# Patient Record
Sex: Female | Born: 1945 | Race: Black or African American | Hispanic: No | Smoking: Never smoker
Health system: Southern US, Community
[De-identification: ages and names within clinical notes are randomized; demographics above are authoritative.]

## PROBLEM LIST (undated history)

## (undated) DIAGNOSIS — R413 Other amnesia: Secondary | ICD-10-CM

## (undated) DIAGNOSIS — K219 Gastro-esophageal reflux disease without esophagitis: Secondary | ICD-10-CM

## (undated) DIAGNOSIS — K439 Ventral hernia without obstruction or gangrene: Secondary | ICD-10-CM

## (undated) DIAGNOSIS — G40209 Localization-related (focal) (partial) symptomatic epilepsy and epileptic syndromes with complex partial seizures, not intractable, without status epilepticus: Secondary | ICD-10-CM

## (undated) DIAGNOSIS — E119 Type 2 diabetes mellitus without complications: Secondary | ICD-10-CM

## (undated) HISTORY — PX: TUBAL LIGATION: SHX77

## (undated) HISTORY — DX: Other amnesia: R41.3

## (undated) HISTORY — DX: Type 2 diabetes mellitus without complications: E11.9

## (undated) HISTORY — DX: Localization-related (focal) (partial) symptomatic epilepsy and epileptic syndromes with complex partial seizures, not intractable, without status epilepticus: G40.209

## (undated) HISTORY — PX: BRAIN SURGERY: SHX531

## (undated) HISTORY — DX: Gastro-esophageal reflux disease without esophagitis: K21.9

## (undated) HISTORY — PX: CARDIAC CATHETERIZATION: SHX172

## (undated) HISTORY — PX: VENTRAL HERNIA REPAIR: SHX424

## (undated) HISTORY — PX: HERNIA REPAIR: SHX51

---

## 1999-04-15 ENCOUNTER — Emergency Department (HOSPITAL_COMMUNITY): Admission: EM | Admit: 1999-04-15 | Discharge: 1999-04-15 | Payer: Self-pay

## 1999-06-16 ENCOUNTER — Emergency Department (HOSPITAL_COMMUNITY): Admission: EM | Admit: 1999-06-16 | Discharge: 1999-06-16 | Payer: Self-pay | Admitting: Internal Medicine

## 2001-10-06 ENCOUNTER — Encounter: Payer: Self-pay | Admitting: Emergency Medicine

## 2001-10-06 ENCOUNTER — Emergency Department (HOSPITAL_COMMUNITY): Admission: EM | Admit: 2001-10-06 | Discharge: 2001-10-06 | Payer: Self-pay | Admitting: Emergency Medicine

## 2003-03-14 ENCOUNTER — Emergency Department (HOSPITAL_COMMUNITY): Admission: EM | Admit: 2003-03-14 | Discharge: 2003-03-14 | Payer: Self-pay | Admitting: Emergency Medicine

## 2003-08-15 ENCOUNTER — Emergency Department (HOSPITAL_COMMUNITY): Admission: EM | Admit: 2003-08-15 | Discharge: 2003-08-15 | Payer: Self-pay | Admitting: Emergency Medicine

## 2007-03-11 ENCOUNTER — Emergency Department (HOSPITAL_COMMUNITY): Admission: EM | Admit: 2007-03-11 | Discharge: 2007-03-12 | Payer: Self-pay | Admitting: Emergency Medicine

## 2007-09-01 ENCOUNTER — Emergency Department (HOSPITAL_COMMUNITY): Admission: EM | Admit: 2007-09-01 | Discharge: 2007-09-01 | Payer: Self-pay | Admitting: Emergency Medicine

## 2008-06-17 ENCOUNTER — Emergency Department (HOSPITAL_COMMUNITY): Admission: EM | Admit: 2008-06-17 | Discharge: 2008-06-17 | Payer: Self-pay | Admitting: Emergency Medicine

## 2008-06-23 ENCOUNTER — Inpatient Hospital Stay (HOSPITAL_COMMUNITY): Admission: AD | Admit: 2008-06-23 | Discharge: 2008-06-23 | Payer: Self-pay | Admitting: Obstetrics & Gynecology

## 2008-06-23 ENCOUNTER — Ambulatory Visit: Payer: Self-pay | Admitting: Obstetrics and Gynecology

## 2009-05-18 ENCOUNTER — Emergency Department (HOSPITAL_COMMUNITY): Admission: EM | Admit: 2009-05-18 | Discharge: 2009-05-18 | Payer: Self-pay | Admitting: Emergency Medicine

## 2010-10-27 LAB — DIFFERENTIAL
Eosinophils Absolute: 0.1
Eosinophils Relative: 3
Lymphs Abs: 3.1
Monocytes Absolute: 0.4
Monocytes Relative: 6

## 2010-10-27 LAB — CBC
HCT: 39.8
MCV: 93.1
RBC: 4.28
WBC: 5.8

## 2010-10-27 LAB — BASIC METABOLIC PANEL
CO2: 31
Calcium: 9.5
Creatinine, Ser: 0.63
GFR calc Af Amer: >60

## 2011-02-12 DIAGNOSIS — K56600 Partial intestinal obstruction, unspecified as to cause: Secondary | ICD-10-CM | POA: Insufficient documentation

## 2011-02-13 DIAGNOSIS — E8809 Other disorders of plasma-protein metabolism, not elsewhere classified: Secondary | ICD-10-CM | POA: Insufficient documentation

## 2011-12-25 ENCOUNTER — Encounter (HOSPITAL_COMMUNITY): Payer: Self-pay

## 2011-12-25 ENCOUNTER — Inpatient Hospital Stay (HOSPITAL_COMMUNITY)
Admission: AD | Admit: 2011-12-25 | Discharge: 2011-12-25 | Disposition: A | Discharge status: Home / Self Care | Payer: Non-veteran care | Source: Ambulatory Visit | Attending: Obstetrics & Gynecology | Admitting: Obstetrics & Gynecology

## 2011-12-25 DIAGNOSIS — R030 Elevated blood-pressure reading, without diagnosis of hypertension: Secondary | ICD-10-CM | POA: Insufficient documentation

## 2011-12-25 DIAGNOSIS — H538 Other visual disturbances: Secondary | ICD-10-CM | POA: Insufficient documentation

## 2011-12-25 HISTORY — DX: Ventral hernia without obstruction or gangrene: K43.9

## 2011-12-25 LAB — GLUCOSE, CAPILLARY: Glucose-Capillary: 92 mg/dL (ref 70–99)

## 2011-12-25 NOTE — MAU Provider Note (Signed)
History     CSN: [id number]  Arrival date and time: 12/25/11 1659   None     No chief complaint on file.  HPI 66 y.o. female who comes in feeling "odd" today and wants to have her blood sugar tested. No specific symptoms except some blurred vision and light colored urine. She had high blood sugar in the hospital in February treated with insulin but was not sent home with insulin. She has been told she has "borderline diabetes."  She does not check her sugars and is not on any medication. She checks her urine - if it is very light, she thinks her blood sugar may be high, and it was light today. She had a similar episode 10 years ago and her sugar was 200-300.   She denies headache, shortness of breath, chest pain, palpitations, numbness, tingling, weakness, speech changes, confusion. No fever/chills. She does feel tired today because she baked bread all weekend and is a little sore as well.   She does not have a PCP but has been see in hospital at [facility] for ventral hernia repair. She has VA benefits and is not yet enrolled in Medicare.   Past Medical History  Diagnosis Date  . Ventral hernia   Borderline diabetes  Past Surgical History  Procedure Date  . Cardiac catheterization, normal   . Ventral hernia repair   . Tubal ligation     Family History  Problem Relation Age of Onset  . Arthritis Mother     History  Substance Use Topics  . Smoking status: Never Smoker   . Smokeless tobacco: Not on file  . Alcohol Use: No    Allergies: Allergies not on file  No prescriptions prior to admission   Review of Systems  Constitutional: Negative for fever and chills.  HENT: Negative for hearing loss, congestion and tinnitus.   Eyes: Positive for blurred vision. Negative for double vision.  Respiratory: Negative for cough and shortness of breath.   Cardiovascular: Negative for chest pain, palpitations and leg swelling.  Gastrointestinal: Negative for nausea, vomiting,  abdominal pain, diarrhea and constipation.  Genitourinary: Negative for dysuria and urgency.  Musculoskeletal: Negative for myalgias and back pain.  Neurological: Negative for dizziness, weakness and headaches.  Psychiatric/Behavioral: Negative for depression.   Physical Exam   Blood pressure 148/87, pulse 77, temperature 98.1 F (36.7 C), temperature source Oral, resp. rate 16, SpO2 98.00%.  Physical Exam  Constitutional: She is oriented to person, place, and time. She appears well-developed and well-nourished. No distress.  HENT:  Head: Normocephalic and atraumatic.  Mouth/Throat: Oropharynx is clear and moist.  Eyes: Conjunctivae normal and EOM are normal.  Neck: Normal range of motion. Neck supple. No tracheal deviation present.  Cardiovascular: Normal rate, regular rhythm, normal heart sounds and intact distal pulses.  Exam reveals no gallop and no friction rub.   No murmur heard. Respiratory: Effort normal and breath sounds normal. No stridor. No respiratory distress. She has no wheezes.  GI: Soft. Bowel sounds are normal. There is no tenderness.  Musculoskeletal: Normal range of motion. She exhibits no edema and no tenderness.  Neurological: She is alert and oriented to person, place, and time. She has normal reflexes. She displays normal reflexes. No cranial nerve deficit. She exhibits normal muscle tone.       Strength 5/5 upper and lower extremities bilaterally  Skin: Skin is warm and dry.  Psychiatric: She has a normal mood and affect.    POCT CBG 92  MAU Course  Procedures   Assessment and Plan  66 y.o. female with concern for high blood sugar due to feeling "odd" - normal CBG - BP is elevated at 148/87 - No other symptoms. - Normal neuro exam - Discharge home. Establish care with PCP for BP and blood sugar control  Napoleon Form, MD 12/25/2011 5:53 PM   Napoleon Form 12/25/2011, 5:31 PM

## 2011-12-25 NOTE — MAU Note (Signed)
Patient states she has just not felt like usual today. States she had the same feeling about 10 years ago and her blood sugar was over 300. Was put on oral medication that made her CBG's to low and the medication was stopped. Manages with diet only. Has not had CBG checked since February and got one dose of insulin while hospitalized at [facility] but was not put on medication when she was discharged.

## 2012-01-23 ENCOUNTER — Encounter (HOSPITAL_COMMUNITY): Payer: Self-pay | Admitting: Emergency Medicine

## 2012-01-23 ENCOUNTER — Emergency Department (HOSPITAL_COMMUNITY)
Admission: EM | Admit: 2012-01-23 | Discharge: 2012-01-23 | Disposition: A | Discharge status: Home / Self Care | Payer: Non-veteran care | Attending: Emergency Medicine | Admitting: Emergency Medicine

## 2012-01-23 ENCOUNTER — Emergency Department (HOSPITAL_COMMUNITY): Payer: Non-veteran care

## 2012-01-23 DIAGNOSIS — M549 Dorsalgia, unspecified: Secondary | ICD-10-CM | POA: Insufficient documentation

## 2012-01-23 DIAGNOSIS — R0982 Postnasal drip: Secondary | ICD-10-CM | POA: Insufficient documentation

## 2012-01-23 DIAGNOSIS — R05 Cough: Secondary | ICD-10-CM | POA: Insufficient documentation

## 2012-01-23 DIAGNOSIS — J00 Acute nasopharyngitis [common cold]: Secondary | ICD-10-CM | POA: Insufficient documentation

## 2012-01-23 DIAGNOSIS — J159 Unspecified bacterial pneumonia: Secondary | ICD-10-CM | POA: Insufficient documentation

## 2012-01-23 DIAGNOSIS — R059 Cough, unspecified: Secondary | ICD-10-CM | POA: Insufficient documentation

## 2012-01-23 DIAGNOSIS — Z8719 Personal history of other diseases of the digestive system: Secondary | ICD-10-CM | POA: Insufficient documentation

## 2012-01-23 DIAGNOSIS — J029 Acute pharyngitis, unspecified: Secondary | ICD-10-CM | POA: Insufficient documentation

## 2012-01-23 DIAGNOSIS — J189 Pneumonia, unspecified organism: Secondary | ICD-10-CM

## 2012-01-23 LAB — COMPREHENSIVE METABOLIC PANEL
ALT: 9 U/L (ref 0–35)
Alkaline Phosphatase: 62 U/L (ref 39–117)
GFR calc Af Amer: >90 mL/min (ref >90)
Glucose, Bld: 120 mg/dL — ABNORMAL HIGH (ref 70–99)
Potassium: 4.2 mEq/L (ref 3.5–5.1)
Sodium: 128 mEq/L — ABNORMAL LOW (ref 135–145)
Total Protein: 7.7 g/dL (ref 6.0–8.3)

## 2012-01-23 LAB — RAPID STREP SCREEN (MED CTR MEBANE ONLY): Streptococcus, Group A Screen (Direct): NEGATIVE

## 2012-01-23 LAB — CBC
Hemoglobin: 12.6 g/dL (ref 12.0–15.0)
MCHC: 34.4 g/dL (ref 30.0–36.0)
RBC: 4.05 MIL/uL (ref 3.87–5.11)

## 2012-01-23 MED ORDER — SODIUM CHLORIDE 0.9 % IV BOLUS (SEPSIS): 1000.0000 mL | Freq: Once | INTRAVENOUS | Status: DC

## 2012-01-23 MED ORDER — AZITHROMYCIN 250 MG PO TABS: 250.0000 mg | ORAL_TABLET | Freq: Every day | ORAL | Status: DC

## 2012-01-23 MED ORDER — DEXTROSE 5 % IV SOLN
500.0000 mg | Freq: Once | INTRAVENOUS | Status: AC
  Administered 2012-01-23: 500 mg via INTRAVENOUS
  Filled 2012-01-23 (×2): qty 500

## 2012-01-23 MED ORDER — ACETAMINOPHEN 325 MG PO TABS
650.0000 mg | ORAL_TABLET | Freq: Once | ORAL | Status: AC
  Administered 2012-01-23: 650 mg via ORAL
  Filled 2012-01-23: qty 1

## 2012-01-23 MED ORDER — SODIUM CHLORIDE 0.9 % IV BOLUS (SEPSIS)
1000.0000 mL | Freq: Once | INTRAVENOUS | Status: AC
  Administered 2012-01-23: 1000 mL via INTRAVENOUS

## 2012-01-23 NOTE — Progress Notes (Signed)
No pcp was listed for veteran administration covered pt She confirmed with Cm she is seen by various drs at [facility] EPIC updated

## 2012-01-23 NOTE — ED Notes (Addendum)
Pt presenting to ed with c/o congestion and coughing up a lot of phlegm pt states yellowish in color. Pt denies chest pain, shortness of breath, nausea and vomiting at this time. Pt states onset x 4 days. Pt also c/o sore throat with drainage

## 2012-01-23 NOTE — ED Provider Notes (Signed)
History     CSN: [id number]  Arrival date & time 01/23/12  1343   First MD Initiated Contact with Patient 01/23/12 1503      Chief Complaint  Patient presents with  . Nasal Congestion  . Cough    (Consider location/radiation/quality/duration/timing/severity/associated sxs/prior treatment) HPIMattie Judie Petit Guerrero is a 66 y.o. female with a history of cough and nasal drainage that started on Saturday. It got worse, they're now moderate to severe, she's having rhinorrhea and congestion, mild sore throat, no headaches, occasional chills, productive cough of yellow sputum. Some bilateral pain in her back this to her scapula, this is a minor ache. She is some minor chest pain but only when she coughs, this is not worse when she breathes deeply or when she is active.  Past Medical History  Diagnosis Date  . Ventral hernia     Past Surgical History  Procedure Date  . Cardiac catheterization   . Ventral hernia repair   . Tubal ligation     Family History  Problem Relation Age of Onset  . Arthritis Mother     History  Substance Use Topics  . Smoking status: Never Smoker   . Smokeless tobacco: Not on file  . Alcohol Use: No    OB History    Grav Para Term Preterm Abortions TAB SAB Ect Mult Living   3 3 3       3       Review of Systems At least 10pt or greater review of systems completed and are negative except where specified in the HPI.  Allergies  Review of patient's allergies indicates no known allergies.  Home Medications  No current outpatient prescriptions on file.  BP 117/71  Pulse 104  Temp 100.6 F (38.1 C) (Oral)  Resp 20  SpO2 98%  Physical Exam  Nursing notes reviewed.  Electronic medical record reviewed. VITAL SIGNS:   Filed Vitals:   01/23/12 1347 01/23/12 1352  BP: 144/72 117/71  Pulse: 106 104  Temp: 99.9 F (37.7 C) 100.6 F (38.1 C)  TempSrc: Oral Oral  Resp: 16 20  SpO2: 98% 98%   CONSTITUTIONAL: Awake, oriented, appears  non-toxic HENT: Atraumatic, normocephalic, oral mucosa pink and moist, airway patent. Nares patent, turbinates edematous with clear nasal drainage. 3+ tonsils, no exudate mild erythema. External ears normal. EYES: Conjunctiva clear, EOMI, PERRLA NECK: Trachea midline, non-tender, supple CARDIOVASCULAR: Normal heart rate, Normal rhythm, No murmurs, rubs, gallops PULMONARY/CHEST: Decreased breath sounds in mid-lung posterior, with rales. Clear otherwise. Non-tender. ABDOMINAL: Non-distended, soft, non-tender - no rebound or guarding.  BS normal. NEUROLOGIC: Non-focal, moving all four extremities, no gross sensory or motor deficits. EXTREMITIES: No clubbing, cyanosis, or edema SKIN: Warm, Dry, No erythema, No rash  ED Course  Procedures (including critical care time)   Labs Reviewed  RAPID STREP SCREEN  CBC  COMPREHENSIVE METABOLIC PANEL   Dg Chest 2 View  01/23/2012  *RADIOLOGY REPORT*  Clinical Data:  CHEST - 2 VIEW  Comparison: 03/11/2007.  Findings: Two-view exam shows focal opacity in the posterior aspect of the medial left lower lobe.  Right lung is clear.  Interstitial markings are mildly coarsened. The cardiopericardial silhouette is within normal limits for size. Imaged bony structures of the thorax are intact. Telemetry leads overlie the chest.  IMPRESSION: Focal opacity in the posterior left lower lobe.  Given the clinical history of fever and cough, this is likely pneumonia.  However given the focal appearance, close follow-up is recommended to ensure complete resolution  as underlying neoplasm could present similarly.   Original Report Authenticated By: Kennith Center, M.D.      1. Community acquired pneumonia   2. Coryza   3. Post-nasal drainage       MDM  Sabrina Guerrero is a 66 y.o. female presents with symptoms suggestive of pneumonia. Patient has no sinus symptoms, her throat looks mildly irritated from postnasal drip, she does have changes in breath sounds commensurate  with changes on chest x-ray. Patient is mildly dehydrated, 3 hydrated her with some normal saline given her some azithromycin IV. Patient's white count is normal, she is afebrile, she's not exhibiting any septic physiology. She is nontoxic clinically. If the patient can safely be treated as an outpatient, give her 4 more days of azithromycin and she'll be discharged stable home in good condition.  I explained the diagnosis and have given explicit precautions to return to the ER including altered mental status, shortness of breath, chest pain, or any other new or worsening symptoms. The patient understands and accepts the medical plan as it's been dictated and I have answered their questions. Discharge instructions concerning home care and prescriptions have been given.  The patient is STABLE and is discharged to home in good condition.            Jones Skene, MD 01/25/12 2130

## 2012-01-31 ENCOUNTER — Emergency Department (HOSPITAL_COMMUNITY)
Admission: EM | Admit: 2012-01-31 | Discharge: 2012-01-31 | Discharge status: Against Medical Advice | Payer: Non-veteran care | Attending: Emergency Medicine | Admitting: Emergency Medicine

## 2012-01-31 DIAGNOSIS — Z760 Encounter for issue of repeat prescription: Secondary | ICD-10-CM | POA: Insufficient documentation

## 2012-01-31 DIAGNOSIS — M545 Low back pain, unspecified: Secondary | ICD-10-CM | POA: Insufficient documentation

## 2012-01-31 NOTE — ED Notes (Signed)
Pt not in WR when called

## 2012-01-31 NOTE — ED Notes (Signed)
Pt c/o drainage down throat and pain down lower back. Pt finished Zithromax on Tues. Pt requesting "stronger" abx to clear up her drainage. Pt states her lower back pain is not anything new. Pt with no acute distress.

## 2012-01-31 NOTE — ED Notes (Signed)
Not in WR when called 

## 2012-11-17 ENCOUNTER — Telehealth: Payer: Self-pay

## 2012-11-17 NOTE — Telephone Encounter (Signed)
Patient states that she has stopped all of her medications because they were making her feel so bad.  Would not give a pharmacy because she doesn't get any medications Has been using the [place] [facility] for healthcare. ROI for records (?) [facility] with bowel obstruction in early part of 2014   To discuss with provider: "I will explain to her my situation when I see her tomorrow".

## 2012-11-18 ENCOUNTER — Encounter: Payer: Self-pay | Admitting: Gastroenterology

## 2012-11-18 ENCOUNTER — Encounter: Payer: Self-pay | Admitting: Family Medicine

## 2012-11-18 ENCOUNTER — Ambulatory Visit (INDEPENDENT_AMBULATORY_CARE_PROVIDER_SITE_OTHER): Payer: Medicare Other | Admitting: Family Medicine

## 2012-11-18 VITALS — BP 120/74 | HR 82 | Temp 98.1°F | Resp 16 | Ht 60.75 in | Wt 145.5 lb

## 2012-11-18 DIAGNOSIS — E041 Nontoxic single thyroid nodule: Secondary | ICD-10-CM

## 2012-11-18 DIAGNOSIS — E119 Type 2 diabetes mellitus without complications: Secondary | ICD-10-CM | POA: Diagnosis not present

## 2012-11-18 DIAGNOSIS — Z8719 Personal history of other diseases of the digestive system: Secondary | ICD-10-CM

## 2012-11-18 DIAGNOSIS — E1165 Type 2 diabetes mellitus with hyperglycemia: Secondary | ICD-10-CM | POA: Insufficient documentation

## 2012-11-18 DIAGNOSIS — K219 Gastro-esophageal reflux disease without esophagitis: Secondary | ICD-10-CM

## 2012-11-18 DIAGNOSIS — F441 Dissociative fugue: Secondary | ICD-10-CM | POA: Insufficient documentation

## 2012-11-18 LAB — BASIC METABOLIC PANEL
Calcium: 9.4 mg/dL (ref 8.4–10.5)
GFR: 93.45 mL/min (ref >60.00)
Sodium: 140 mEq/L (ref 135–145)

## 2012-11-18 LAB — CBC WITH DIFFERENTIAL/PLATELET
Basophils Absolute: 0 10*3/uL (ref 0.0–0.1)
Eosinophils Absolute: 0 10*3/uL (ref 0.0–0.7)
HCT: 37.8 % (ref 36.0–46.0)
Lymphs Abs: 1.7 10*3/uL (ref 0.7–4.0)
MCHC: 33.5 g/dL (ref 30.0–36.0)
MCV: 94.7 fl (ref 78.0–100.0)
Monocytes Absolute: 0.3 10*3/uL (ref 0.1–1.0)
Monocytes Relative: 6.2 % (ref 3.0–12.0)
Platelets: 212 10*3/uL (ref 150.0–400.0)
RDW: 13.4 % (ref 11.5–14.6)

## 2012-11-18 LAB — HEPATIC FUNCTION PANEL
Alkaline Phosphatase: 65 U/L (ref 39–117)
Bilirubin, Direct: 0 mg/dL (ref 0.0–0.3)
Total Bilirubin: 0.3 mg/dL (ref 0.3–1.2)

## 2012-11-18 LAB — LIPID PANEL
HDL: 41.1 mg/dL (ref >39.00)
Total CHOL/HDL Ratio: 6
Triglycerides: 62 mg/dL (ref 0.0–149.0)

## 2012-11-18 LAB — B12 AND FOLATE PANEL: Vitamin B-12: 510 pg/mL (ref 211–911)

## 2012-11-18 MED ORDER — PANTOPRAZOLE SODIUM 40 MG PO TBEC: 40.0000 mg | DELAYED_RELEASE_TABLET | Freq: Every day | ORAL | Status: DC

## 2012-11-18 NOTE — Patient Instructions (Signed)
Schedule your complete physical in 3 months We'll notify you of your lab results and make any changes if needed We'll call you with your Ultrasound appt, Neuro appt, and GI appt Start the Protonix daily for the stomach acid/gas pain Please don't drive until you see Neurology Call with any questions or concerns Hang in there!!!
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
## 2012-11-19 ENCOUNTER — Ambulatory Visit (HOSPITAL_BASED_OUTPATIENT_CLINIC_OR_DEPARTMENT_OTHER): Payer: Non-veteran care

## 2012-11-19 ENCOUNTER — Other Ambulatory Visit: Payer: Self-pay | Admitting: General Practice

## 2012-11-19 ENCOUNTER — Encounter: Payer: Self-pay | Admitting: General Practice

## 2012-11-19 ENCOUNTER — Ambulatory Visit (HOSPITAL_BASED_OUTPATIENT_CLINIC_OR_DEPARTMENT_OTHER)
Admission: RE | Admit: 2012-11-19 | Discharge: 2012-11-19 | Disposition: A | Discharge status: Home / Self Care | Payer: Non-veteran care | Source: Ambulatory Visit | Attending: Family Medicine | Admitting: Family Medicine

## 2012-11-19 DIAGNOSIS — E041 Nontoxic single thyroid nodule: Secondary | ICD-10-CM | POA: Insufficient documentation

## 2012-11-19 MED ORDER — ATORVASTATIN CALCIUM 20 MG PO TABS: 20.0000 mg | ORAL_TABLET | Freq: Every day | ORAL | Status: DC

## 2012-11-20 ENCOUNTER — Other Ambulatory Visit: Payer: Self-pay | Admitting: General Practice

## 2012-11-20 DIAGNOSIS — E041 Nontoxic single thyroid nodule: Secondary | ICD-10-CM

## 2012-11-21 ENCOUNTER — Ambulatory Visit: Payer: Non-veteran care | Admitting: Gastroenterology

## 2012-12-03 ENCOUNTER — Ambulatory Visit (INDEPENDENT_AMBULATORY_CARE_PROVIDER_SITE_OTHER): Payer: Medicare Other | Admitting: Family Medicine

## 2012-12-03 ENCOUNTER — Encounter: Payer: Self-pay | Admitting: Family Medicine

## 2012-12-03 VITALS — BP 110/74 | HR 68 | Temp 98.3°F | Resp 16 | Wt 145.1 lb

## 2012-12-03 DIAGNOSIS — F411 Generalized anxiety disorder: Secondary | ICD-10-CM | POA: Diagnosis not present

## 2012-12-03 DIAGNOSIS — M62838 Other muscle spasm: Secondary | ICD-10-CM | POA: Diagnosis not present

## 2012-12-03 NOTE — Progress Notes (Signed)
  Subjective:    Patient ID: Sabrina Guerrero, female    DOB: 04-10-1945, 67 y.o.   MRN: [id number]  HPI R arm pain- mostly localized to R upper arm but will migrate into lower arm and across back.  Feels pain is related to her 'nerves'.  R hand dominant.  Pain is worse at end of the day.  Nervous- 'i'm real, real nervous'.  This is new for pt.  No hx of anxiety.  Nervous feeling will come in waves.  'it scares me'.  sxs will last ~20 minutes and then resolve spontaneously.  Will take [facility] supplement w/ some relief of sxs.  Nervousness doesn't relate to her episodes of lost time- pt tends to lose time at night.  No particular trigger.   Review of Systems For ROS see HPI     Objective:   Physical Exam  Vitals reviewed. Constitutional: She is oriented to person, place, and time. She appears well-developed and well-nourished. No distress.  HENT:  Head: Normocephalic and atraumatic.  Neck: Normal range of motion.  R trap spasm  Musculoskeletal:  Full ROM of R shoulder, elbow, wrist  Neurological: She is alert and oriented to person, place, and time. She has normal reflexes. No cranial nerve deficit. Coordination normal.  Skin: Skin is warm and dry.  Psychiatric: She has a normal mood and affect. Her behavior is normal. Thought content normal.          Assessment & Plan:

## 2012-12-03 NOTE — Patient Instructions (Signed)
Follow up as scheduled Your neck, back, and arm pain are due to overuse (hard work) and will improve with Tylenol as needed and heating pad Your nerves are likely due to increased stress from work and all that you're trying to do You may want to consider decreasing your work schedule If your nerves continue to be an issue, please let me know so we can start medicine Call with any questions or concerns Hang in there!!!

## 2012-12-05 ENCOUNTER — Ambulatory Visit: Payer: Non-veteran care | Admitting: Family Medicine

## 2012-12-07 NOTE — Assessment & Plan Note (Signed)
New to provider.  Pt's sxs had improved w/ PPI but stopped this due to perceived side effects.  Start Protonix and monitor for improvement.  Will follow.

## 2012-12-07 NOTE — Assessment & Plan Note (Signed)
New to provider.  Pt reports she was hospitalized at [facility] for this but we have no records.  Overdue for colonoscopy.  Refer to GI.  Pt expressed understanding and is in agreement w/ plan.

## 2012-12-07 NOTE — Assessment & Plan Note (Signed)
New.  Discussed scaling back her workload as this appears to be too much for pt.  Pt considering stopping entirely.  Had conversation w/ pt and her daughter about possible medications and pt would prefer to hold at this time and try and manage her stress more naturally.

## 2012-12-07 NOTE — Assessment & Plan Note (Signed)
New to provider, ongoing for pt.  Was managing w/ healthy diet- no current meds due to symptomatic hypoglycemia.  Check labs.  LDL goal is 70-100, not currently on ACE/ARB.  Due for eye exam.  Will need baseline A1C and determine next steps.  Pt expressed understanding and is in agreement w/ plan.

## 2012-12-07 NOTE — Assessment & Plan Note (Signed)
New.  Very concerning.  Check labs to r/o underlying metabolic cause.  Refer to neuro.  Pt instructed not to drive as there is no known trigger for her lost periods of time.  Will follow closely.

## 2012-12-07 NOTE — Assessment & Plan Note (Addendum)
New.  Start tylenol and ibuprofen prn.  Heat.  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  Will follow.

## 2012-12-07 NOTE — Assessment & Plan Note (Signed)
New.  Pt denies any previous knowledge of this.  Check labs and get [place] to assess.

## 2013-01-07 ENCOUNTER — Other Ambulatory Visit (INDEPENDENT_AMBULATORY_CARE_PROVIDER_SITE_OTHER): Payer: Medicare Other

## 2013-01-07 DIAGNOSIS — E785 Hyperlipidemia, unspecified: Secondary | ICD-10-CM | POA: Diagnosis not present

## 2013-01-08 ENCOUNTER — Encounter: Payer: Self-pay | Admitting: General Practice

## 2013-01-08 LAB — HEPATIC FUNCTION PANEL
ALT: 11 U/L (ref 0–35)
AST: 19 U/L (ref 0–37)
Albumin: 3.7 g/dL (ref 3.5–5.2)
Alkaline Phosphatase: 55 U/L (ref 39–117)

## 2013-03-10 ENCOUNTER — Telehealth: Payer: Self-pay | Admitting: Neurology

## 2013-03-10 NOTE — Telephone Encounter (Signed)
I will be happy to see this patient for an evaluation. The patient can be worked in.

## 2013-03-10 NOTE — Telephone Encounter (Signed)
Spoke to Dr. Gershon Crane about patient, whom he knows, and he requested that Dr. Jannifer Franklin see the patient.  She has been having episodes of "passing out", starring where she does not respond, and also memory loss.  He said this has been going on for about 3-4 months.  He is very concerned.  I spoke to Dr. Jannifer Franklin who will see the patient asap  and relayed this to Dr. Gershon Crane.  He was very thankful.

## 2013-03-11 ENCOUNTER — Encounter: Payer: Self-pay | Admitting: Neurology

## 2013-03-11 ENCOUNTER — Encounter (INDEPENDENT_AMBULATORY_CARE_PROVIDER_SITE_OTHER): Payer: Self-pay

## 2013-03-11 ENCOUNTER — Ambulatory Visit (INDEPENDENT_AMBULATORY_CARE_PROVIDER_SITE_OTHER): Payer: Non-veteran care | Admitting: Neurology

## 2013-03-11 VITALS — BP 97/58 | HR 71 | Ht 62.0 in | Wt 146.0 lb

## 2013-03-11 DIAGNOSIS — R4182 Altered mental status, unspecified: Secondary | ICD-10-CM | POA: Insufficient documentation

## 2013-03-11 HISTORY — DX: Altered mental status, unspecified: R41.82
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
## 2013-03-17 ENCOUNTER — Ambulatory Visit (INDEPENDENT_AMBULATORY_CARE_PROVIDER_SITE_OTHER): Payer: Non-veteran care | Admitting: Radiology

## 2013-03-17 ENCOUNTER — Telehealth: Payer: Self-pay | Admitting: Neurology

## 2013-03-17 DIAGNOSIS — R4182 Altered mental status, unspecified: Secondary | ICD-10-CM

## 2013-03-17 MED ORDER — LEVETIRACETAM 500 MG PO TABS: ORAL_TABLET | ORAL | Status: DC

## 2013-03-17 NOTE — Procedures (Signed)
     Sabrina Guerrero is a 68 year old patient with a history of events of "staring off" beginning in March of 2014, occasionally associated with lipsmacking behavior. The patient may have confusion lasting 1 or 2 minutes following the episodes of staring. The patient is being evaluated for possible seizure events.  This is a routine EEG. No skull defects are noted. Medications include simethicone.  EEG classification: Dysrhythmia grade 3 right temporal  Description of the recording: The background rhythms of this recording consists of a very well modulated medium amplitude alpha rhythm of 10 Hz that is reactive to eye opening and closure. As the record progresses, intermittent 3 or 4 Hz theta slowing is seen in the right temporal region. As the record continues, intermittent sharp and slow-wave complexes are seen emanating from the right hemisphere as well, occasionally associated with more generalized brief theta frequency slowing. Photic stimulation is performed, and this results in a bilateral and symmetric photic drive response. Hyperventilation is also done, resulting in a minimal buildup of the background rhythm activity without significant slowing seen. EKG monitor shows no evidence of cardiac rhythm abnormalities with a heart rate of 72.  Impression: This is an abnormal EEG recording secondary to epileptiform activity emanating from the right temporal region. 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 This study would correlate well with the clinical manifestations of staring episodes.

## 2013-03-17 NOTE — Telephone Encounter (Signed)
Patient called and is returning Dr. Jannifer Franklin' call regarding the medication he prescribed for her today, patient states she works part time and Dr. Jannifer Franklin mentioned that the meds could cause grumpiness or drowsiness. Patient is wondering if she will still be able to work. Please call and advise.

## 2013-03-17 NOTE — Telephone Encounter (Signed)
I called patient. The Keppra generally is well tolerated, plus we are going to start at half dose, gradually go to the full dose. If side effects are noted, the patient is to call me.

## 2013-03-17 NOTE — Telephone Encounter (Signed)
I called patient. The EEG study reveals evidence of a right temporal irritable focus suggestive of seizures. The patient will be placed on Keppra. MRI of the brain is pending.

## 2013-03-20 ENCOUNTER — Inpatient Hospital Stay: Admission: RE | Admit: 2013-03-20 | Payer: Non-veteran care | Source: Ambulatory Visit

## 2013-03-30 ENCOUNTER — Encounter (HOSPITAL_COMMUNITY): Payer: Self-pay | Admitting: Emergency Medicine

## 2013-03-30 ENCOUNTER — Emergency Department (HOSPITAL_COMMUNITY)
Admission: EM | Admit: 2013-03-30 | Discharge: 2013-03-31 | Disposition: A | Discharge status: Home / Self Care | Payer: Non-veteran care | Attending: Emergency Medicine | Admitting: Emergency Medicine

## 2013-03-30 DIAGNOSIS — E119 Type 2 diabetes mellitus without complications: Secondary | ICD-10-CM | POA: Insufficient documentation

## 2013-03-30 DIAGNOSIS — M79602 Pain in left arm: Secondary | ICD-10-CM

## 2013-03-30 DIAGNOSIS — R569 Unspecified convulsions: Secondary | ICD-10-CM

## 2013-03-30 DIAGNOSIS — Z9889 Other specified postprocedural states: Secondary | ICD-10-CM | POA: Insufficient documentation

## 2013-03-30 DIAGNOSIS — R9431 Abnormal electrocardiogram [ECG] [EKG]: Secondary | ICD-10-CM | POA: Diagnosis not present

## 2013-03-30 DIAGNOSIS — Z8719 Personal history of other diseases of the digestive system: Secondary | ICD-10-CM | POA: Insufficient documentation

## 2013-03-30 DIAGNOSIS — M79609 Pain in unspecified limb: Secondary | ICD-10-CM | POA: Insufficient documentation

## 2013-03-30 DIAGNOSIS — G40909 Epilepsy, unspecified, not intractable, without status epilepticus: Secondary | ICD-10-CM | POA: Insufficient documentation

## 2013-03-30 LAB — I-STAT TROPONIN, ED: Troponin i, poc: 0 ng/mL (ref 0.00–0.08)

## 2013-03-30 MED ORDER — LEVETIRACETAM 250 MG PO TABS
250.0000 mg | ORAL_TABLET | Freq: Once | ORAL | Status: AC
  Administered 2013-03-31: 250 mg via ORAL
  Filled 2013-03-30: qty 1

## 2013-03-30 NOTE — ED Notes (Addendum)
Presents with left arm pain with radiation to left shoulder blade, began suddenly at 9 pm. Described as sharp and "like part of my body stopped circulation, that is what it felt like in my hand" denies chest pain, denies slurred speech, denies headache and blurred vision.  No arm drift, no facial droop. Denies nausea, denies SOB. Pt is alert, oriented, MAEx4. Bilateral grips equal. BP 115/72.  Symptoms have resolved on own.

## 2013-03-30 NOTE — ED Provider Notes (Signed)
CSN: [id number]     Arrival date & time 03/30/13  2227 History   First MD Initiated Contact with Patient 03/30/13 2331     Chief Complaint  Patient presents with  . 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 She states this started approximately 3 hours prior to evaluation, began in her hand, with sharp and extremely uncomfortable, it spread proximally up her forearm, to her upper arm and then onto her back below her left shoulder blade. This then spontaneously resolved. She denied any other symptoms during that time including chest pain shortness of breath numbness or weakness. She has never had cardiac disease, she has a distant history of a heart catheterization which was nonobstructive at that time, has diet-controlled diabetes and is not treated for hypertension, hypercholesterolemia and is a nonsmoker. At this time the patient is symptom-free. During the history portion of my encounter with the patient she had a focal seizure similar to what was described in the notes from Dr. Jannifer Franklin of neurology. This included staring off, lip smacking and resolved after approximately 1 minute. There was a short period where the patient was confused and then returned back to her baseline.  Patient is a 68 y.o. female presenting with arm pain. The history is provided by the patient, a relative and medical records.  Arm Pain    Past Medical History  Diagnosis Date  . Ventral hernia   . Diabetes mellitus without complication   . Gastroesophageal reflux disease    Past Surgical History  Procedure Laterality Date  . Cardiac catheterization    . Ventral hernia repair    . Tubal ligation     Family History  Problem Relation Age of Onset  . Arthritis Mother   . Cancer Brother    lung   History  Substance Use Topics  . Smoking status: Never Smoker   . Smokeless tobacco: Never Used  . Alcohol Use: No   OB History   Grav Para Term Preterm Abortions TAB SAB Ect Mult Living   3 3 3       3      Review of Systems  All other systems reviewed and are negative.      Allergies  Review of patient's allergies indicates no known allergies.  Home Medications   No current outpatient prescriptions on file. BP 121/73  Pulse 72  Temp(Src) 97.6 F (36.4 C) (Oral)  Resp 19  Wt 147 lb (66.679 kg)  SpO2 98% Physical Exam  Nursing note and vitals reviewed. Constitutional: She appears well-developed and well-nourished. No distress.  HENT:  Head: Normocephalic and atraumatic.  Mouth/Throat: Oropharynx is clear and moist. No oropharyngeal exudate.  Eyes: Conjunctivae and EOM are normal. Pupils are equal, round, and reactive to light. Right eye exhibits no discharge. Left eye exhibits no discharge. No scleral icterus.  Neck: Normal range of motion. Neck supple. No JVD present. No thyromegaly present.  Cardiovascular: Normal rate, regular rhythm, normal heart sounds and intact distal pulses.  Exam reveals no gallop and no friction rub.   No murmur heard. Pulmonary/Chest: Effort normal and breath sounds normal. No respiratory distress. She has no wheezes. She has no rales.  Abdominal: Soft. Bowel sounds are normal. She exhibits no distension and no mass. There is no tenderness.  Musculoskeletal: Normal range of motion. She exhibits no edema and  no tenderness.  Lymphadenopathy:    She has no cervical adenopathy.  Neurological: She is alert. Coordination normal.  Normal strength sensation and reflexes of the bilateral upper and lower extremities on the speech is clear, cranial nerves III through XII are intact, peripheral visual fields are normal  Skin: Skin is warm and dry. No rash noted. No erythema.  Psychiatric: She has a normal mood and affect. Her behavior is  normal.    ED Course  Procedures (including critical care time) Labs Review Labs Reviewed  CBC - Abnormal; Notable for the following:    RBC 3.82 (*)    HCT 35.9 (*)    All other components within normal limits  BASIC METABOLIC PANEL - Abnormal; Notable for the following:    Glucose, Bld 114 (*)    GFR calc non Af Amer 88 (*)    All other components within normal limits  I-STAT TROPOININ, ED  I-STAT TROPOININ, ED   Imaging Review No results found.  EKG Interpretation    Date/Time:  Monday March 30 2013 22:53:45 EST Ventricular Rate:  75 PR Interval:  144 QRS Duration: 90 QT Interval:  396 QTC Calculation: 442 R Axis:   3 Text Interpretation:  Normal sinus rhythm Left anterior fasicular block Poor R wave progression Abnormal ECG since last tracing no significant change Confirmed by Nandi Tonnesen  MD, Kiante Ciavarella ([id number]) on 03/30/2013 11:36:20 PM            MDM   Final diagnoses:  Pain of left upper extremity  Seizure    The patient has unremarkable vital signs, unremarkable EKG and a clinical exam is benign at this time. The etiology of the patient's symptoms is unclear at this time, she is extremely low risk for heart disease, she will get a troponin and a repeat troponin. The initial troponin is normal. In addition the patient had a seizure however this is an ongoing medical evaluation and there is no acute intervention is required at this time. I will give her an additional dose of her Keppra as she is currently only taking 250 mg once a day.  The patient has had no more seizures since getting Keppra, she now admits to not using the Keppra since starting it after the first time she took it and it made her feel a bit drowsy. She also endorses that she has been driving, I have cautioned her not to drive anymore and told her that it would be legally wrong for her to be doing so. The family is aware of this conversation, they will help to take her medication and to not drive. 2  troponins normal, patient appears stable for discharge, no ongoing symptoms.   Meds given in ED:  Medications  levETIRAcetam (KEPPRA) tablet 250 mg (250 mg Oral Given 03/31/13 0027)    New Prescriptions   No medications on file      Johnna Acosta, MD 03/31/13 [phone]

## 2013-03-30 NOTE — ED Notes (Signed)
Dr Miller at Bedside.

## 2013-03-31 LAB — BASIC METABOLIC PANEL
BUN: 13 mg/dL (ref 6–23)
CO2: 28 meq/L (ref 19–32)
Calcium: 9.3 mg/dL (ref 8.4–10.5)
Chloride: 100 mEq/L (ref 96–112)
Creatinine, Ser: 0.69 mg/dL (ref 0.50–1.10)
GFR calc Af Amer: >90 mL/min (ref >90)
GFR calc non Af Amer: 88 mL/min — ABNORMAL LOW (ref >90)
GLUCOSE: 114 mg/dL — AB (ref 70–99)
POTASSIUM: 3.8 meq/L (ref 3.7–5.3)
SODIUM: 138 meq/L (ref 137–147)

## 2013-03-31 LAB — CBC
HEMATOCRIT: 35.9 % — AB (ref 36.0–46.0)
HEMOGLOBIN: 12.2 g/dL (ref 12.0–15.0)
MCH: 31.9 pg (ref 26.0–34.0)
MCHC: 34 g/dL (ref 30.0–36.0)
MCV: 94 fL (ref 78.0–100.0)
Platelets: 194 10*3/uL (ref 150–400)
RBC: 3.82 MIL/uL — AB (ref 3.87–5.11)
RDW: 12.7 % (ref 11.5–15.5)
WBC: 6.3 10*3/uL (ref 4.0–10.5)

## 2013-03-31 LAB — I-STAT TROPONIN, ED: TROPONIN I, POC: 0.02 ng/mL (ref 0.00–0.08)

## 2013-03-31 NOTE — Discharge Instructions (Signed)
Take the Keppra exactly as prescribed by your doctor.  Please call your doctor for a followup appointment within 24-48 hours. When you talk to your doctor please let them know that you were seen in the emergency department and have them acquire all of your records so that they can discuss the findings with you and formulate a treatment plan to fully care for your new and ongoing problems.

## 2013-04-09 ENCOUNTER — Telehealth: Payer: Self-pay | Admitting: Neurology

## 2013-04-09 MED ORDER — LEVETIRACETAM ER 500 MG PO TB24: 1000.0000 mg | ORAL_TABLET | Freq: Every day | ORAL | Status: DC

## 2013-04-09 NOTE — Telephone Encounter (Signed)
Pt called and stated that the medicine she was prescribed makes her face feel like there is something pulling on it.  This feeling last for 4 - 5 hours and seems to affect her when she takes it in the morning.  She states if it happens at her night dose she is unsure as she is asleep.  She also would like to know how long she will need to take this medication. She also stated that when she went to the ER at the end of February they gave her a medication that they said was the same thing, but it did not make her feel the same way.  She was wondering if maybe it was a generic or made by another company.  She also would like to know what is her official diagnosis.  Please call to discuss.  Thank you

## 2013-04-09 NOTE — Telephone Encounter (Signed)
I called patient. The patient went to the emergency room on 03/30/2013 with another staring episode. The patient has been on low-dose Keppra taking 250 mg twice daily, but she indicates that this results in a heavy sensation on the face bilaterally. The patient has missed some doses because of this. I will convert the patient to Keppra XR to see if the side effect profile can be improved. I'll go up to 1000 mg daily.

## 2013-04-09 NOTE — Telephone Encounter (Addendum)
I called pt.  She is taking generic keppra 250mg  po bid.  She took initially for 3 days, then off for a week, then restarted up again.  Never has gone to 1 tablet po bid.  C/o of daytime facial heaviness, nagging feeling pain after taking the keppra (last for 4-5 hours then subsides).  Notes nothing with night-time dose (sleeps thru?).  Received dose in ED back 03-30-13 (250mg  keppra) and noted that she did not have this feeling in her face.  Different manufacturer?    I told her she had R focal temporal seizures.  Next appt 07-09-13 with NP.  Has not had anymore seizures (staring spell, lip smacking).

## 2013-04-24 ENCOUNTER — Telehealth: Payer: Self-pay | Admitting: Neurology

## 2013-04-24 NOTE — Telephone Encounter (Signed)
Patient's son is calling to see if the Cat scan done at the [facility] hospital has arrived and to make Dr. Jannifer Franklin is aware.

## 2013-04-24 NOTE — Telephone Encounter (Signed)
I have called all numbers available, the numbers given are not functioning. I have not received the CT scan done to the [facility]. I'll look out for it.

## 2013-04-24 NOTE — Telephone Encounter (Signed)
Pt's son called and stated that he took his mother to the [place] hospital in [place] on 04-15-13 and they completed a CAT scan.  He wanted to see if the results of that scan have been received and to make Dr. Jannifer Franklin aware that this had been done.  Please call the son back at [phone] (cell) or [phone] (home).  Thank you

## 2013-04-27 ENCOUNTER — Telehealth: Payer: Self-pay | Admitting: Neurology

## 2013-04-27 MED ORDER — KEPPRA XR 500 MG PO TB24: 1000.0000 mg | ORAL_TABLET | Freq: Every day | ORAL | Status: DC

## 2013-04-27 NOTE — Telephone Encounter (Signed)
Patient's daughter calling to state that patient does not like the way generic Keppra makes her feel, patient states they gave her something in the hospital that was brand name and that made her feel much better. Patient requesting brand name of Keppra instead, please call and advise.

## 2013-04-27 NOTE — Telephone Encounter (Signed)
I called patient. The patient apparently does not feel well on the generic Keppra, I will call in brand-name Rx are taking 1000 mg at night. She's not able tolerate this, we may need to cut back to 500 mg at night.

## 2013-07-09 ENCOUNTER — Ambulatory Visit: Payer: Self-pay | Admitting: Nurse Practitioner

## 2013-07-09 ENCOUNTER — Telehealth: Payer: Self-pay | Admitting: Nurse Practitioner

## 2013-07-09 ENCOUNTER — Telehealth: Payer: Self-pay | Admitting: Neurology

## 2013-07-09 MED ORDER — CARBAMAZEPINE 200 MG PO TABS: 100.0000 mg | ORAL_TABLET | Freq: Two times a day (BID) | ORAL | Status: DC

## 2013-07-09 NOTE — Telephone Encounter (Signed)
Patient's daughter Andee Poles calling to state that patient had a bad reaction to Keppra and they would like to discuss different brands when she comes in for her visit today. If questions, please return call to daughter.

## 2013-07-09 NOTE — Telephone Encounter (Signed)
I called the daughter, left a message. I will call back later.

## 2013-07-09 NOTE — Telephone Encounter (Signed)
I called the daughter, apparently the patient never really took the [place], indicated she felt bad on it. I will try calling in low dose carbamazepine.

## 2013-07-09 NOTE — Telephone Encounter (Signed)
Patient was no show for today's office appointment.  

## 2013-07-09 NOTE — Telephone Encounter (Signed)
Called pt's daughter Andee Poles and left message about pt having a bad reaction to Keppra and discussing the issue when pt comes in today for appt, but pt did not show up for her appt and if there was anything else that we could do for the pt, to give our office a call. FYI

## 2013-11-03 ENCOUNTER — Emergency Department (HOSPITAL_COMMUNITY)
Admission: EM | Admit: 2013-11-03 | Discharge: 2013-11-03 | Disposition: A | Discharge status: Home / Self Care | Payer: Medicare HMO | Attending: Emergency Medicine | Admitting: Emergency Medicine

## 2013-11-03 ENCOUNTER — Emergency Department (HOSPITAL_COMMUNITY): Payer: Medicare HMO

## 2013-11-03 ENCOUNTER — Encounter (HOSPITAL_COMMUNITY): Payer: Self-pay | Admitting: Emergency Medicine

## 2013-11-03 DIAGNOSIS — Z9889 Other specified postprocedural states: Secondary | ICD-10-CM | POA: Insufficient documentation

## 2013-11-03 DIAGNOSIS — Z79899 Other long term (current) drug therapy: Secondary | ICD-10-CM | POA: Diagnosis not present

## 2013-11-03 DIAGNOSIS — R079 Chest pain, unspecified: Secondary | ICD-10-CM | POA: Insufficient documentation

## 2013-11-03 DIAGNOSIS — Z8719 Personal history of other diseases of the digestive system: Secondary | ICD-10-CM | POA: Diagnosis not present

## 2013-11-03 DIAGNOSIS — R0789 Other chest pain: Secondary | ICD-10-CM | POA: Diagnosis not present

## 2013-11-03 DIAGNOSIS — E119 Type 2 diabetes mellitus without complications: Secondary | ICD-10-CM | POA: Diagnosis not present

## 2013-11-03 DIAGNOSIS — R110 Nausea: Secondary | ICD-10-CM | POA: Diagnosis not present

## 2013-11-03 LAB — BASIC METABOLIC PANEL
Anion gap: 8 (ref 5–15)
BUN: 7 mg/dL (ref 6–23)
CALCIUM: 9.3 mg/dL (ref 8.4–10.5)
CO2: 29 meq/L (ref 19–32)
Chloride: 99 mEq/L (ref 96–112)
Creatinine, Ser: 0.57 mg/dL (ref 0.50–1.10)
GFR calc Af Amer: >90 mL/min (ref >90)
GFR calc non Af Amer: >90 mL/min (ref >90)
GLUCOSE: 102 mg/dL — AB (ref 70–99)
POTASSIUM: 3.8 meq/L (ref 3.7–5.3)
Sodium: 136 mEq/L — ABNORMAL LOW (ref 137–147)

## 2013-11-03 LAB — I-STAT TROPONIN, ED
Troponin i, poc: 0 ng/mL (ref 0.00–0.08)
Troponin i, poc: 0 ng/mL (ref 0.00–0.08)

## 2013-11-03 LAB — CBC
HCT: 36.3 % (ref 36.0–46.0)
HEMOGLOBIN: 12.2 g/dL (ref 12.0–15.0)
MCH: 30.9 pg (ref 26.0–34.0)
MCHC: 33.6 g/dL (ref 30.0–36.0)
MCV: 91.9 fL (ref 78.0–100.0)
PLATELETS: 200 10*3/uL (ref 150–400)
RBC: 3.95 MIL/uL (ref 3.87–5.11)
RDW: 12.1 % (ref 11.5–15.5)
WBC: 4.4 10*3/uL (ref 4.0–10.5)

## 2013-11-03 MED ORDER — NITROGLYCERIN 0.4 MG SL SUBL
0.4000 mg | SUBLINGUAL_TABLET | Freq: Once | SUBLINGUAL | Status: AC
Start: 2013-11-03 — End: 2013-11-03
  Administered 2013-11-03: 0.4 mg via SUBLINGUAL
  Filled 2013-11-03: qty 1

## 2013-11-03 NOTE — ED Notes (Signed)
Pt discharged to home with family; NAD noted

## 2013-11-03 NOTE — Discharge Instructions (Signed)

## 2013-11-03 NOTE — ED Provider Notes (Signed)
Pt has ongoing mild back pain - states is non exertional, labs / ECG reviewed, pt appears stable for d/c and has f/u with GI and will f/u with PMD and cards,  Will return if sx worsen.   Johnna Acosta, MD 11/03/13 [phone]

## 2013-11-03 NOTE — ED Notes (Signed)
Pt ambulated to restroom. 

## 2013-11-03 NOTE — ED Notes (Addendum)
Pt reports midsternal cp radiating to L side starting a few days ago, worse today; pt also reports pain to R arm; pt denies SOB, n/v

## 2013-11-03 NOTE — ED Notes (Signed)
Dr. Alvino Chapel back at the bedside.

## 2013-11-03 NOTE — ED Provider Notes (Addendum)
CSN: [id number]     Arrival date & time 11/03/13  1158 History   First MD Initiated Contact with Patient 11/03/13 1355     Chief Complaint  Patient presents with  . Chest Pain     (Consider location/radiation/quality/duration/timing/severity/associated sxs/prior Treatment) Patient is a 68 y.o. female presenting with chest pain. The history is provided by the patient.  Chest Pain Pain location:  L chest Associated symptoms: nausea   Associated symptoms: no abdominal pain, no back pain, no headache, no numbness, no shortness of breath, not vomiting and no weakness    patient with a dull pain in her left chest. It is dull. Mild tightness in the upper chest also. No nausea. No difficulty breathing. No known coronary disease. Has a history of prediabetes. Has a history of GERD. Pain is not worse with exertion. She's had some dull right-sided pain, but states it started with left-sided pain. It does not change with exertion. She does walk daily. She had a negative heart catheterization, however that was in 1985.   Past Medical History  Diagnosis Date  . Ventral hernia   . Diabetes mellitus without complication   . Gastroesophageal reflux disease    Past Surgical History  Procedure Laterality Date  . Cardiac catheterization    . Ventral hernia repair    . Tubal ligation     Family History  Problem Relation Age of Onset  . Arthritis Mother   . Cancer Brother     lung   History  Substance Use Topics  . Smoking status: Never Smoker   . Smokeless tobacco: Never Used  . Alcohol Use: No   OB History   Grav Para Term Preterm Abortions TAB SAB Ect Mult Living   3 3 3       3      Review of Systems  Constitutional: Negative for activity change and appetite change.  Eyes: Negative for pain.  Respiratory: Negative for chest tightness and shortness of breath.   Cardiovascular: Positive for chest pain. Negative for leg swelling.  Gastrointestinal: Positive for nausea. Negative for  vomiting, abdominal pain and diarrhea.  Genitourinary: Negative for flank pain.  Musculoskeletal: Negative for back pain and neck stiffness.  Skin: Negative for rash.  Neurological: Negative for weakness, numbness and headaches.  Psychiatric/Behavioral: Negative for behavioral problems.      Allergies  Keppra  Home Medications   Prior to Admission medications   Medication Sig Start Date End Date Taking? Authorizing Provider  carbamazepine (TEGRETOL) 200 MG tablet Take 0.5 tablets (100 mg total) by mouth 2 (two) times daily. 07/09/13   Kathrynn Ducking, MD   BP 131/82  Pulse 66  Temp(Src) 98.1 F (36.7 C) (Oral)  Resp 13  Ht 5\' 2"  (1.575 m)  Wt 142 lb (64.411 kg)  BMI 25.97 kg/m2  SpO2 98% Physical Exam  Nursing note and vitals reviewed. Constitutional: She is oriented to person, place, and time. She appears well-developed and well-nourished.  HENT:  Head: Normocephalic and atraumatic.  Eyes: EOM are normal. Pupils are equal, round, and reactive to light.  Neck: Normal range of motion. Neck supple.  Cardiovascular: Normal rate, regular rhythm and normal heart sounds.   No murmur heard. Pulmonary/Chest: Effort normal and breath sounds normal. No respiratory distress. She has no wheezes. She has no rales.  Abdominal: Soft. Bowel sounds are normal. She exhibits no distension. There is no tenderness. There is no rebound and no guarding.  Musculoskeletal: Normal range of motion.  Neurological:  She is alert and oriented to person, place, and time. No cranial nerve deficit.  Skin: Skin is warm and dry.  Psychiatric: She has a normal mood and affect. Her speech is normal.    ED Course  Procedures (including critical care time) Labs Review Labs Reviewed  BASIC METABOLIC PANEL - Abnormal; Notable for the following:    Sodium 136 (*)    Glucose, Bld 102 (*)    All other components within normal limits  CBC  I-STAT TROPOININ, ED  Randolm Idol, ED    Imaging Review Dg  Chest 2 View  11/03/2013   CLINICAL DATA:  68 year old female with left upper chest pain for 2 days. Initial encounter.  EXAM: CHEST  2 VIEW  COMPARISON:  01/23/2012 and earlier.  FINDINGS: Improved lung volumes and resolved left perihilar consolidation that was present in 2013. Normal cardiac size and mediastinal contours. Visualized tracheal air column is within normal limits. No pneumothorax, pulmonary edema, pleural effusion or confluent pulmonary opacity. No acute osseous abnormality identified.  IMPRESSION: No acute cardiopulmonary abnormality.   Electronically Signed   By: Lars Pinks M.D.   On: 11/03/2013 14:02     EKG Interpretation   Date/Time:  Tuesday November 03 2013 12:04:00 EDT Ventricular Rate:  78 PR Interval:  138 QRS Duration: 88 QT Interval:  392 QTC Calculation: 446 R Axis:   -17 Text Interpretation:  Normal sinus rhythm Possible Left atrial enlargement  Borderline ECG Confirmed by Alvino Chapel  MD, Ovid Curd [phone]) on 11/03/2013  1:55:42 PM      MDM   Final diagnoses:  Chest pain, unspecified chest pain type    Patient with chest pain. Overall relatively low risk. Story is not very worrisome. Continued mild dull pain. Will get second troponin and if negative will discharge home to followup with her PCP    Jasper Riling. Alvino Chapel, MD 11/03/13 1518  Initial pulse ox was 77%, however is seen no hypoxia during my evaluations with her.  Jasper Riling. Alvino Chapel, MD 11/03/13 1535

## 2013-12-07 ENCOUNTER — Encounter (HOSPITAL_COMMUNITY): Payer: Self-pay | Admitting: Emergency Medicine

## 2013-12-23 ENCOUNTER — Encounter: Payer: Self-pay | Admitting: Neurology

## 2013-12-29 ENCOUNTER — Encounter: Payer: Self-pay | Admitting: Neurology

## 2014-01-10 ENCOUNTER — Emergency Department (HOSPITAL_COMMUNITY)
Admission: EM | Admit: 2014-01-10 | Discharge: 2014-01-10 | Disposition: A | Discharge status: Home / Self Care | Payer: Medicare HMO | Attending: Emergency Medicine | Admitting: Emergency Medicine

## 2014-01-10 ENCOUNTER — Emergency Department (HOSPITAL_COMMUNITY): Payer: Medicare HMO

## 2014-01-10 ENCOUNTER — Encounter (HOSPITAL_COMMUNITY): Payer: Self-pay | Admitting: *Deleted

## 2014-01-10 DIAGNOSIS — M549 Dorsalgia, unspecified: Secondary | ICD-10-CM | POA: Diagnosis not present

## 2014-01-10 DIAGNOSIS — M542 Cervicalgia: Secondary | ICD-10-CM | POA: Diagnosis not present

## 2014-01-10 DIAGNOSIS — J029 Acute pharyngitis, unspecified: Secondary | ICD-10-CM | POA: Diagnosis present

## 2014-01-10 DIAGNOSIS — Z9889 Other specified postprocedural states: Secondary | ICD-10-CM | POA: Diagnosis not present

## 2014-01-10 DIAGNOSIS — Z79899 Other long term (current) drug therapy: Secondary | ICD-10-CM | POA: Insufficient documentation

## 2014-01-10 DIAGNOSIS — E119 Type 2 diabetes mellitus without complications: Secondary | ICD-10-CM | POA: Diagnosis not present

## 2014-01-10 DIAGNOSIS — R070 Pain in throat: Secondary | ICD-10-CM | POA: Diagnosis not present

## 2014-01-10 DIAGNOSIS — Z8719 Personal history of other diseases of the digestive system: Secondary | ICD-10-CM | POA: Insufficient documentation

## 2014-01-10 LAB — CBC WITH DIFFERENTIAL/PLATELET
BASOS ABS: 0 10*3/uL (ref 0.0–0.1)
Basophils Relative: 0 % (ref 0–1)
EOS PCT: 2 % (ref 0–5)
Eosinophils Absolute: 0.1 10*3/uL (ref 0.0–0.7)
HEMATOCRIT: 34.5 % — AB (ref 36.0–46.0)
Hemoglobin: 11.3 g/dL — ABNORMAL LOW (ref 12.0–15.0)
LYMPHS PCT: 54 % — AB (ref 12–46)
Lymphs Abs: 1.8 10*3/uL (ref 0.7–4.0)
MCH: 31 pg (ref 26.0–34.0)
MCHC: 32.8 g/dL (ref 30.0–36.0)
MCV: 94.8 fL (ref 78.0–100.0)
MONO ABS: 0.3 10*3/uL (ref 0.1–1.0)
Monocytes Relative: 8 % (ref 3–12)
Neutro Abs: 1.2 10*3/uL — ABNORMAL LOW (ref 1.7–7.7)
Neutrophils Relative %: 36 % — ABNORMAL LOW (ref 43–77)
PLATELETS: 189 10*3/uL (ref 150–400)
RBC: 3.64 MIL/uL — ABNORMAL LOW (ref 3.87–5.11)
RDW: 12.5 % (ref 11.5–15.5)
WBC: 3.3 10*3/uL — AB (ref 4.0–10.5)

## 2014-01-10 LAB — BASIC METABOLIC PANEL
ANION GAP: 12 (ref 5–15)
BUN: 7 mg/dL (ref 6–23)
CO2: 27 mEq/L (ref 19–32)
Calcium: 9.4 mg/dL (ref 8.4–10.5)
Chloride: 102 mEq/L (ref 96–112)
Creatinine, Ser: 0.58 mg/dL (ref 0.50–1.10)
GFR calc Af Amer: >90 mL/min (ref >90)
Glucose, Bld: 110 mg/dL — ABNORMAL HIGH (ref 70–99)
Potassium: 4 mEq/L (ref 3.7–5.3)
SODIUM: 141 meq/L (ref 137–147)

## 2014-01-10 LAB — I-STAT CHEM 8, ED
BUN: 5 mg/dL — ABNORMAL LOW (ref 6–23)
Calcium, Ion: 1.17 mmol/L (ref 1.13–1.30)
Chloride: 104 mEq/L (ref 96–112)
Creatinine, Ser: 0.6 mg/dL (ref 0.50–1.10)
Glucose, Bld: 108 mg/dL — ABNORMAL HIGH (ref 70–99)
HEMATOCRIT: 38 % (ref 36.0–46.0)
HEMOGLOBIN: 12.9 g/dL (ref 12.0–15.0)
Potassium: 3.8 mEq/L (ref 3.7–5.3)
SODIUM: 141 meq/L (ref 137–147)
TCO2: 26 mmol/L (ref 0–100)

## 2014-01-10 MED ORDER — CLINDAMYCIN PHOSPHATE 600 MG/50ML IV SOLN
600.0000 mg | Freq: Once | INTRAVENOUS | Status: AC
  Administered 2014-01-10: 600 mg via INTRAVENOUS
  Filled 2014-01-10: qty 50

## 2014-01-10 MED ORDER — METHYLPREDNISOLONE SODIUM SUCC 125 MG IJ SOLR
125.0000 mg | Freq: Once | INTRAMUSCULAR | Status: AC
  Administered 2014-01-10: 125 mg via INTRAVENOUS
  Filled 2014-01-10: qty 2

## 2014-01-10 MED ORDER — SODIUM CHLORIDE 0.9 % IV BOLUS (SEPSIS)
500.0000 mL | Freq: Once | INTRAVENOUS | Status: AC
Start: 2014-01-10 — End: 2014-01-10
  Administered 2014-01-10: 500 mL via INTRAVENOUS

## 2014-01-10 MED ORDER — CLINDAMYCIN HCL 150 MG PO CAPS: 300.0000 mg | ORAL_CAPSULE | Freq: Four times a day (QID) | ORAL | Status: DC

## 2014-01-10 MED ORDER — HYDROCODONE-ACETAMINOPHEN 5-325 MG PO TABS: 1.0000 | ORAL_TABLET | ORAL | Status: DC | PRN

## 2014-01-10 MED ORDER — IOHEXOL 300 MG/ML  SOLN
100.0000 mL | Freq: Once | INTRAMUSCULAR | Status: AC | PRN
  Administered 2014-01-10: 100 mL via INTRAVENOUS

## 2014-01-10 NOTE — Discharge Instructions (Signed)
Return to the ER for increasing pain, throat swelling, difficulty swallowing or breathing. Otherwise follow-up with Dr. Wilburn Cornelia in 3-5 days in the office for a recheck.  Pharyngitis Pharyngitis is redness, pain, and swelling (inflammation) of your pharynx.  CAUSES  Pharyngitis is usually caused by infection. Most of the time, these infections are from viruses (viral) and are part of a cold. However, sometimes pharyngitis is caused by bacteria (bacterial). Pharyngitis can also be caused by allergies. Viral pharyngitis may be spread from person to person by coughing, sneezing, and personal items or utensils (cups, forks, spoons, toothbrushes). Bacterial pharyngitis may be spread from person to person by more intimate contact, such as kissing.  SIGNS AND SYMPTOMS  Symptoms of pharyngitis include:   Sore throat.   Tiredness (fatigue).   Low-grade fever.   Headache.  Joint pain and muscle aches.  Skin rashes.  Swollen lymph nodes.  Plaque-like film on throat or tonsils (often seen with bacterial pharyngitis). DIAGNOSIS  Your health care provider will ask you questions about your illness and your symptoms. Your medical history, along with a physical exam, is often all that is needed to diagnose pharyngitis. Sometimes, a rapid strep test is done. Other lab tests may also be done, depending on the suspected cause.  TREATMENT  Viral pharyngitis will usually get better in 3-4 days without the use of medicine. Bacterial pharyngitis is treated with medicines that kill germs (antibiotics).  HOME CARE INSTRUCTIONS   Drink enough water and fluids to keep your urine clear or pale yellow.   Only take over-the-counter or prescription medicines as directed by your health care provider:   If you are prescribed antibiotics, make sure you finish them even if you start to feel better.   Do not take aspirin.   Get lots of rest.   Gargle with 8 oz of salt water ( tsp of salt per 1 qt of  water) as often as every 1-2 hours to soothe your throat.   Throat lozenges (if you are not at risk for choking) or sprays may be used to soothe your throat. SEEK MEDICAL CARE IF:   You have large, tender lumps in your neck.  You have a rash.  You cough up green, yellow-brown, or bloody spit. SEEK IMMEDIATE MEDICAL CARE IF:   Your neck becomes stiff.  You drool or are unable to swallow liquids.  You vomit or are unable to keep medicines or liquids down.  You have severe pain that does not go away with the use of recommended medicines.  You have trouble breathing (not caused by a stuffy nose). MAKE SURE YOU:   Understand these instructions.  Will watch your condition.  Will get help right away if you are not doing well or get worse. Document Released: 01/22/2005 Document Revised: 11/12/2012 Document Reviewed: 09/29/2012 [facility] Patient Information 2015 [place], [place]. This information is not intended to replace advice given to you by your health care provider. Make sure you discuss any questions you have with your health care provider.

## 2014-01-10 NOTE — ED Notes (Signed)
Pt c/o sore throat since Friday and posterior neck pain extending up into head and upper back. Has been using hydrogen peroxide gargle to help sore throat.

## 2014-01-10 NOTE — ED Provider Notes (Signed)
CSN: [id number]     Arrival date & time 01/10/14  1227 History   First MD Initiated Contact with Patient 01/10/14 1242     Chief Complaint  Patient presents with  . Sore Throat  . Headache     (Consider location/radiation/quality/duration/timing/severity/associated sxs/prior Treatment) HPI Comments: Resents to the ER for evaluation of sore throat, headache, neck pain, back pain. Symptoms began 3 days ago. Patient reports that the sore throat has become intermittent. It gets better when she gargles with peroxide but then returns. She has noticed an aching pain across the shoulder blades posteriorly that goes up into the neck and the back part of her head. She has not had any fever. There is no vision change, numbness, tingling, weakness of extremities. Patient denies any injuries.  Patient is a 68 y.o. female presenting with pharyngitis and headaches.  Sore Throat Associated symptoms include headaches.  Headache Associated symptoms: back pain, neck pain and sore throat     Past Medical History  Diagnosis Date  . Ventral hernia   . Diabetes mellitus without complication   . Gastroesophageal reflux disease    Past Surgical History  Procedure Laterality Date  . Cardiac catheterization    . Ventral hernia repair    . Tubal ligation     Family History  Problem Relation Age of Onset  . Arthritis Mother   . Cancer Brother     lung   History  Substance Use Topics  . Smoking status: Never Smoker   . Smokeless tobacco: Never Used  . Alcohol Use: No   OB History    Gravida Para Term Preterm AB TAB SAB Ectopic Multiple Living   3 3 3       3      Review of Systems  HENT: Positive for sore throat.   Musculoskeletal: Positive for back pain and neck pain.  Neurological: Positive for headaches.  All other systems reviewed and are negative.     Allergies  Keppra  Home Medications   Prior to Admission medications   Medication Sig Start Date End Date Taking? Authorizing  Provider  OXcarbazepine (TRILEPTAL) 150 MG tablet Take 150 mg by mouth 2 (two) times daily.   Yes Historical Provider, MD   BP 142/99 mmHg  Pulse 60  Temp(Src) 98.5 F (36.9 C)  Resp 18  Ht 5\' 2"  (1.575 m)  Wt 135 lb (61.236 kg)  BMI 24.69 kg/m2  SpO2 96% Physical Exam  Constitutional: She is oriented to person, place, and time. She appears well-developed and well-nourished. No distress.  HENT:  Head: Normocephalic and atraumatic.  Right Ear: Hearing normal.  Left Ear: Hearing normal.  Nose: Nose normal.  Mouth/Throat: Oropharynx is clear and moist and mucous membranes are normal.    Eyes: Conjunctivae and EOM are normal. Pupils are equal, round, and reactive to light.  Neck: Normal range of motion. Neck supple. No Brudzinski's sign and no Kernig's sign noted.  Cardiovascular: Regular rhythm, S1 normal and S2 normal.  Exam reveals no gallop and no friction rub.   No murmur heard. Pulmonary/Chest: Effort normal and breath sounds normal. No respiratory distress. She exhibits no tenderness.  Abdominal: Soft. Normal appearance and bowel sounds are normal. There is no hepatosplenomegaly. There is no tenderness. There is no rebound, no guarding, no tenderness at McBurney's point and negative Murphy's sign. No hernia.  Musculoskeletal: Normal range of motion.  Neurological: She is alert and oriented to person, place, and time. She has normal strength. No cranial  nerve deficit or sensory deficit. Coordination normal. GCS eye subscore is 4. GCS verbal subscore is 5. GCS motor subscore is 6.  Skin: Skin is warm, dry and intact. No rash noted. No cyanosis.  Psychiatric: She has a normal mood and affect. Her speech is normal and behavior is normal. Thought content normal.  Nursing note and vitals reviewed.   ED Course  Procedures (including critical care time) Labs Review Labs Reviewed  CBC WITH DIFFERENTIAL - Abnormal; Notable for the following:    WBC 3.3 (*)    RBC 3.64 (*)     Hemoglobin 11.3 (*)    HCT 34.5 (*)    Neutrophils Relative % 36 (*)    Neutro Abs 1.2 (*)    Lymphocytes Relative 54 (*)    All other components within normal limits  BASIC METABOLIC PANEL - Abnormal; Notable for the following:    Glucose, Bld 110 (*)    All other components within normal limits  I-STAT CHEM 8, ED - Abnormal; Notable for the following:    BUN 5 (*)    Glucose, Bld 108 (*)    All other components within normal limits    Imaging Review Ct Soft Tissue Neck W Contrast  01/10/2014   CLINICAL DATA:  Sore throat since Friday.  Posterior neck pain.  EXAM: CT NECK WITH CONTRAST  TECHNIQUE: Multidetector CT imaging of the neck was performed using the standard protocol following the bolus administration of intravenous contrast.  CONTRAST:  184mL OMNIPAQUE IOHEXOL 300 MG/ML  SOLN  COMPARISON:  None.  FINDINGS: There is a 6 mm peripherally enhancing low-density focus in the right parapharyngeal soft tissue. There are left tonsilliths with a small amount of fluid density around 1 of the tonsillith which may reflect retained fluid versus infection There is no drainable abscess. There is no lymphadenopathy. There is no nasopharyngeal or oropharyngeal mass. The airway is patent. The visualized portions of the carotid arteries and internal jugular veins are patent.  The thyroid gland is diffusely heterogeneous with areas of calcification overall enlargement likely reflecting a thyroid goiter. The appearance is similar to the prior CT chest exam of 03/11/2007.  The visualized portions of the brain demonstrate no focal abnormality.  The paranasal sinuses are clear.  The lung apices are clear.  There is no lytic or blastic osseous lesion.  IMPRESSION: 1. There is a 6 mm peripherally enhancing low-density focus in the right parapharyngeal soft tissue likely representing a small abscess. 2. There are left tonsilliths with a small amount of fluid density around 1 of the tonsillith which may reflect  retained fluid versus infection. 3. Diffuse thyroid enlargement with a heterogeneous overall appearance and areas of calcification. The overall appearance is most consistent with a thyroid goiter which is similar to prior CT chest dated 03/11/2007, but the thyroid was incompletely imaged on that exam.   Electronically Signed   By: Kathreen Devoid   On: 01/10/2014 14:41     EKG Interpretation None      MDM   Final diagnoses:  Throat pain   early peritonsillar abscess/phlegmon  Patient presents to the ER for evaluation of sore throat and throat and neck pain and has been ongoing for several days. Examination did not reveal any significant erythema or exudate of the throat, however, there was some asymmetric swelling of the right peritonsillar region. No clear indication of peritonsillar abscess. There was also some discoloration of the left tonsillar pillar. This raises my suspicion for possible mass  as well as infection. CT scan was performed to evaluate for mass, peritonsillar abscess, retropharyngeal abscess. There is a 6 mm hypoattenuating area in the right peritonsillar region that is suspicious for abscess. Discussed with Dr. Wilburn Cornelia, on call for ENT. He agrees with medical management. We'll see the patient in the office. Patient given instruction is to return to the ER for worsening pain or trouble swallowing.   Orpah Greek, MD 01/10/14 1537

## 2014-01-13 DIAGNOSIS — J029 Acute pharyngitis, unspecified: Secondary | ICD-10-CM | POA: Diagnosis not present

## 2014-01-13 DIAGNOSIS — Z87898 Personal history of other specified conditions: Secondary | ICD-10-CM | POA: Diagnosis not present

## 2014-03-14 ENCOUNTER — Emergency Department (HOSPITAL_COMMUNITY)
Admission: EM | Admit: 2014-03-14 | Discharge: 2014-03-14 | Discharge status: Against Medical Advice | Payer: Medicare Other | Attending: Emergency Medicine | Admitting: Emergency Medicine

## 2014-03-14 ENCOUNTER — Encounter (HOSPITAL_COMMUNITY): Payer: Self-pay | Admitting: Emergency Medicine

## 2014-03-14 DIAGNOSIS — E119 Type 2 diabetes mellitus without complications: Secondary | ICD-10-CM | POA: Insufficient documentation

## 2014-03-14 DIAGNOSIS — Z9119 Patient's noncompliance with other medical treatment and regimen: Secondary | ICD-10-CM

## 2014-03-14 DIAGNOSIS — R109 Unspecified abdominal pain: Secondary | ICD-10-CM | POA: Insufficient documentation

## 2014-03-14 DIAGNOSIS — Z5320 Procedure and treatment not carried out because of patient's decision for unspecified reasons: Secondary | ICD-10-CM

## 2014-03-14 NOTE — ED Notes (Signed)
Patient here with c/o of abdominal pain mostly on left side. Denies n/v/d. States that she takes Omeprazole and is about out of it, wondering if she could get med refill.

## 2014-03-14 NOTE — ED Notes (Signed)
Pt states she is leaving and going to [place] DR and RN made aware

## 2014-03-14 NOTE — ED Provider Notes (Signed)
Pt told nursing she was going to leave and go to the [place]. She left before being seen.   1. Left before treatment completed      Ernestina Patches, MD 03/14/14 1500

## 2014-05-20 ENCOUNTER — Ambulatory Visit (INDEPENDENT_AMBULATORY_CARE_PROVIDER_SITE_OTHER): Payer: Medicare Other | Admitting: Neurology

## 2014-05-20 ENCOUNTER — Encounter: Payer: Self-pay | Admitting: Neurology

## 2014-05-20 VITALS — BP 98/64 | HR 63 | Ht 62.0 in | Wt 139.2 lb

## 2014-05-20 DIAGNOSIS — G40209 Localization-related (focal) (partial) symptomatic epilepsy and epileptic syndromes with complex partial seizures, not intractable, without status epilepticus: Secondary | ICD-10-CM | POA: Insufficient documentation

## 2014-05-20 DIAGNOSIS — Z5181 Encounter for therapeutic drug level monitoring: Secondary | ICD-10-CM | POA: Diagnosis not present

## 2014-05-20 DIAGNOSIS — R413 Other amnesia: Secondary | ICD-10-CM

## 2014-05-20 HISTORY — DX: Localization-related (focal) (partial) symptomatic epilepsy and epileptic syndromes with complex partial seizures, not intractable, without status epilepticus: G40.209

## 2014-05-20 HISTORY — DX: Other amnesia: R41.3

## 2014-05-20 NOTE — Progress Notes (Signed)
Reason for visit: Seizures  Sabrina Guerrero is an 69 y.o. female  History of present illness:  Sabrina Guerrero is a 69 year old right-handed black female with a history of partial complex type seizures. She was seen last on 03/11/2013. The patient underwent an EEG study which showed evidence of right temporal sharp and slow-wave complexes. The patient had been set up for a brain MRI study, but this was never done. The patient had a CT scan of the brain done through the [facility], but I do not have results of the study. She has never had MRI evaluation. The patient was placed on Keppra, but she could not tolerate the medication. This was discontinued, and she has been placed on Trileptal, currently taking 150 mg twice daily. She indicates that she has not had any recurrent seizures. The last noted seizure was on March 30 2013. 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 There have been some mild issues with memory. The patient did not show for an appointment in June 2015, and she just now is following up through this office for the seizures.  Past Medical History  Diagnosis Date  . Ventral hernia   . Diabetes mellitus without complication   . Gastroesophageal reflux disease   . Partial symptomatic epilepsy with complex partial seizures, not intractable, without status epilepticus 05/20/2014  . Memory deficit 05/20/2014    Past Surgical History  Procedure Laterality Date  . Cardiac catheterization    . Ventral hernia repair    . Tubal ligation      Family History  Problem Relation Age of Onset  . Arthritis Mother   . Cancer Brother     lung    Social history:  reports that she has never smoked. She has never used smokeless tobacco. She reports that she does not drink alcohol or use illicit drugs.    Allergies  Allergen Reactions  . Keppra [Levetiracetam]     Malaise    Medications:  Prior to Admission medications   Medication Sig Start Date End  Date Taking? Authorizing Provider  atorvastatin (LIPITOR) 80 MG tablet Take 40 mg by mouth daily.   Yes Historical Provider, MD  HYDROcodone-acetaminophen (NORCO/VICODIN) 5-325 MG per tablet Take 1-2 tablets by mouth every 4 (four) hours as needed for moderate pain. 01/10/14  Yes Orpah Greek, MD  omeprazole (PRILOSEC) 20 MG capsule Take 40 mg by mouth 2 (two) times daily before a meal.    Yes Historical Provider, MD  OXcarbazepine (TRILEPTAL) 150 MG tablet Take 150 mg by mouth 2 (two) times daily.   Yes Historical Provider, MD    ROS:  Out of a complete 14 system review of symptoms, the patient complains only of the following symptoms, and all other reviewed systems are negative.  Seizures Memory disorder  Blood pressure 98/64, pulse 63, height 5\' 2"  (1.575 m), weight 139 lb 3.2 oz (63.141 kg).  Physical Exam  General: The patient is alert and cooperative at the time of the examination.  Skin: No significant peripheral edema is noted.   Neurologic Exam  Mental status: The patient is alert and oriented x 3 at the time of the examination. The patient has apparent normal recent and remote memory, with an apparently normal attention span and concentration ability. Mini-Mental Status Examination done today shows a total score of 29/30.   Cranial nerves: Facial symmetry is present. Speech is normal, no aphasia or dysarthria is noted. Extraocular movements are  full. Visual fields are full.  Motor: The patient has good strength in all 4 extremities.  Sensory examination: Soft touch sensation is symmetric on the face, arms, and legs.  Coordination: The patient has good finger-nose-finger and heel-to-shin bilaterally.  Gait and station: The patient has a normal gait. Tandem gait is normal. Romberg is negative. No drift is seen.  Reflexes: Deep tendon reflexes are symmetric.   Assessment/Plan:  1. Partial complex seizures  2. Mild memory disturbance  The patient is on  very low-dose Trileptal, she claims that the medication is effective in controlling the seizures. She is operating a motor vehicle currently. She never underwent MRI evaluation of the brain, I will get this set up with and without gadolinium enhancement. We will get blood work today. She will follow-up in 6 months. She indicates that she gets her Trileptal medication through the [facility].  C. Floyde Parkins MD 05/20/2014 9:06 PM  [facility] [address] [place], [place] [address]  Phone [phone] Fax [phone]

## 2014-05-20 NOTE — Patient Instructions (Signed)

## 2014-05-24 ENCOUNTER — Other Ambulatory Visit: Payer: Medicare Other

## 2014-05-24 LAB — COMPREHENSIVE METABOLIC PANEL
A/G RATIO: 1.3 (ref 1.1–2.5)
ALT: 13 IU/L (ref 0–32)
AST: 18 IU/L (ref 0–40)
Albumin: 3.9 g/dL (ref 3.6–4.8)
Alkaline Phosphatase: 68 IU/L (ref 39–117)
BILIRUBIN TOTAL: 0.2 mg/dL (ref 0.0–1.2)
BUN / CREAT RATIO: 16 (ref 11–26)
BUN: 12 mg/dL (ref 8–27)
CALCIUM: 9.3 mg/dL (ref 8.7–10.3)
CO2: 29 mmol/L (ref 18–29)
CREATININE: 0.76 mg/dL (ref 0.57–1.00)
Chloride: 100 mmol/L (ref 97–108)
GFR, EST AFRICAN AMERICAN: 93 mL/min/{1.73_m2} (ref >59)
GFR, EST NON AFRICAN AMERICAN: 81 mL/min/{1.73_m2} (ref >59)
GLOBULIN, TOTAL: 2.9 g/dL (ref 1.5–4.5)
Glucose: 92 mg/dL (ref 65–99)
POTASSIUM: 4.1 mmol/L (ref 3.5–5.2)
Sodium: 141 mmol/L (ref 134–144)
Total Protein: 6.8 g/dL (ref 6.0–8.5)

## 2014-05-24 LAB — CBC WITH DIFFERENTIAL/PLATELET
BASOS ABS: 0 10*3/uL (ref 0.0–0.2)
Basos: 1 %
EOS: 3 %
Eosinophils Absolute: 0.1 10*3/uL (ref 0.0–0.4)
HCT: 38.7 % (ref 34.0–46.6)
Hemoglobin: 12.6 g/dL (ref 11.1–15.9)
IMMATURE GRANULOCYTES: 0 %
Immature Grans (Abs): 0 10*3/uL (ref 0.0–0.1)
Lymphocytes Absolute: 1.7 10*3/uL (ref 0.7–3.1)
Lymphs: 49 %
MCH: 31 pg (ref 26.6–33.0)
MCHC: 32.6 g/dL (ref 31.5–35.7)
MCV: 95 fL (ref 79–97)
MONOCYTES: 9 %
Monocytes Absolute: 0.3 10*3/uL (ref 0.1–0.9)
Neutrophils Absolute: 1.3 10*3/uL — ABNORMAL LOW (ref 1.4–7.0)
Neutrophils Relative %: 38 %
Platelets: 193 10*3/uL (ref 150–379)
RBC: 4.06 x10E6/uL (ref 3.77–5.28)
RDW: 13.4 % (ref 12.3–15.4)
WBC: 3.4 10*3/uL (ref 3.4–10.8)

## 2014-05-24 LAB — 10-HYDROXYCARBAZEPINE: OXCARBAZEPINE SERPL-MCNC: 8 ug/mL — AB (ref 10–35)

## 2014-05-25 ENCOUNTER — Telehealth: Payer: Self-pay

## 2014-05-25 NOTE — Telephone Encounter (Signed)
Left voicemail relaying results. 

## 2014-05-25 NOTE — Telephone Encounter (Signed)
-----   Message from Kathrynn Ducking, MD sent at 05/25/2014 10:54 AM EDT ----- The blood work results are unremarkable. The Trileptal level is low, but the seizures are under good control. No dose adjustments. Please call the patient. ----- Message -----    From: [facility] Lab Results In Interface    Sent: 05/21/2014   7:47 AM      To: Kathrynn Ducking, MD

## 2014-06-14 ENCOUNTER — Ambulatory Visit
Admission: RE | Admit: 2014-06-14 | Discharge: 2014-06-14 | Disposition: A | Discharge status: Home / Self Care | Payer: Medicare Other | Source: Ambulatory Visit | Attending: Neurology | Admitting: Neurology

## 2014-06-14 DIAGNOSIS — G40209 Localization-related (focal) (partial) symptomatic epilepsy and epileptic syndromes with complex partial seizures, not intractable, without status epilepticus: Secondary | ICD-10-CM

## 2014-06-14 MED ORDER — GADOBENATE DIMEGLUMINE 529 MG/ML IV SOLN
13.0000 mL | Freq: Once | INTRAVENOUS | Status: AC | PRN
  Administered 2014-06-14: 13 mL via INTRAVENOUS

## 2014-06-15 ENCOUNTER — Telehealth: Payer: Self-pay | Admitting: Neurology

## 2014-06-15 NOTE — Telephone Encounter (Signed)
  I called the patient. The MRI the brain is essentially normal. I discussed this with her.  MRI brain 06/15/2014:  IMPRESSION:  Mildly abnormal MRI brain (with and without) demonstrating: 1. Minimal periventricular and subcortical foci of non-specific gliosis. No abnormal lesions are seen on post contrast views.  2. No acute findings.

## 2014-10-09 ENCOUNTER — Encounter (HOSPITAL_COMMUNITY): Payer: Self-pay | Admitting: Nurse Practitioner

## 2014-10-09 ENCOUNTER — Emergency Department (HOSPITAL_COMMUNITY)
Admission: EM | Admit: 2014-10-09 | Discharge: 2014-10-09 | Disposition: A | Discharge status: Home / Self Care | Payer: Medicare Other | Attending: Emergency Medicine | Admitting: Emergency Medicine

## 2014-10-09 DIAGNOSIS — M79602 Pain in left arm: Secondary | ICD-10-CM | POA: Insufficient documentation

## 2014-10-09 DIAGNOSIS — Z79899 Other long term (current) drug therapy: Secondary | ICD-10-CM | POA: Diagnosis not present

## 2014-10-09 DIAGNOSIS — G40209 Localization-related (focal) (partial) symptomatic epilepsy and epileptic syndromes with complex partial seizures, not intractable, without status epilepticus: Secondary | ICD-10-CM | POA: Diagnosis not present

## 2014-10-09 DIAGNOSIS — E119 Type 2 diabetes mellitus without complications: Secondary | ICD-10-CM | POA: Diagnosis not present

## 2014-10-09 DIAGNOSIS — K219 Gastro-esophageal reflux disease without esophagitis: Secondary | ICD-10-CM | POA: Insufficient documentation

## 2014-10-09 LAB — I-STAT CHEM 8, ED
BUN: 13 mg/dL (ref 6–20)
CALCIUM ION: 1.24 mmol/L (ref 1.13–1.30)
Chloride: 99 mmol/L — ABNORMAL LOW (ref 101–111)
Creatinine, Ser: 0.7 mg/dL (ref 0.44–1.00)
GLUCOSE: 85 mg/dL (ref 65–99)
HCT: 42 % (ref 36.0–46.0)
HEMOGLOBIN: 14.3 g/dL (ref 12.0–15.0)
Potassium: 3.7 mmol/L (ref 3.5–5.1)
Sodium: 137 mmol/L (ref 135–145)
TCO2: 27 mmol/L (ref 0–100)

## 2014-10-09 LAB — I-STAT TROPONIN, ED: TROPONIN I, POC: 0 ng/mL (ref 0.00–0.08)

## 2014-10-09 NOTE — Discharge Instructions (Signed)
May take tylenol or motrin as needed for pain. Make sure to watch diet-- avoid too much sugar. Follow-up with your primary care physician. Return here for new concerns.

## 2014-10-09 NOTE — ED Notes (Signed)
She reports last night about 10 pm she noticed L shoulder pain radiating down her L arm into fingertips and shooting across her back at times. She denies any injury to the arm. She states the arm has felt weak at times. She is A&Ox4, resp e/u

## 2014-10-09 NOTE — ED Notes (Signed)
MD at bedside. 

## 2014-10-09 NOTE — ED Provider Notes (Signed)
Patient developed left shoulder pain which radiated to her left upper arm, left forearm and left wrist and into the fingertips last night after she lifted a heavy pot. Pain is nonexertional pain felt like a muscle cramp. She denies any chest pain denies any shortness of breath, nausea or sweatiness pain resolved this morning. No other associated symptoms she is presently asymptomatic and pain-free on exam no distress lungs clear auscultation heart regular rate and rhythm or 4 extremity throughout redness swelling or tenderness neurovascularly intact. Strongly doubt ACS  Orlie Dakin, MD 10/09/14 1356

## 2014-10-09 NOTE — ED Provider Notes (Signed)
CSN: [id number]     Arrival date & time 10/09/14  1113 History   First MD Initiated Contact with Patient 10/09/14 1148     Chief Complaint  Patient presents with  . Arm Pain     (Consider location/radiation/quality/duration/timing/severity/associated sxs/prior Treatment) Patient is a 69 y.o. female presenting with arm pain. The history is provided by the patient and medical records.  Arm Pain Associated symptoms include myalgias.    This is a 69 y.o. F with hx of DM (not currently on medications) GERD, seizure disorder, presenting to the ED for atraumatic left arm pain.  Patient states she developed left arm pain last night, has been intermittent throughout the night.  She describes it as a "spasm/cramp type pain" in her left upper arm/shoulder with some radiation down into left arm.  Patient states if she moves her arm, the cramping will go away.  States similar episodes with her legs.  Patient adamantly denies chest pain, SOB, diaphoresis, N/V, cough, fever, chills, or other URI type symptoms.  Patient has no known cardiac hx.  She has never been a smoker.  On arrival to ED, patient denies any current pain. She states she just wanted to be "checked out". Vital signs stable.  Past Medical History  Diagnosis Date  . Ventral hernia   . Diabetes mellitus without complication   . Gastroesophageal reflux disease   . Partial symptomatic epilepsy with complex partial seizures, not intractable, without status epilepticus 05/20/2014  . Memory deficit 05/20/2014   Past Surgical History  Procedure Laterality Date  . Cardiac catheterization    . Ventral hernia repair    . Tubal ligation     Family History  Problem Relation Age of Onset  . Arthritis Mother   . Cancer Brother     lung   Social History  Substance Use Topics  . Smoking status: Never Smoker   . Smokeless tobacco: Never Used  . Alcohol Use: No   OB History    Gravida Para Term Preterm AB TAB SAB Ectopic Multiple Living   3 3  3       3      Review of Systems  Musculoskeletal: Positive for myalgias.  All other systems reviewed and are negative.     Allergies  Keppra  Home Medications   Prior to Admission medications   Medication Sig Start Date End Date Taking? Authorizing Provider  atorvastatin (LIPITOR) 80 MG tablet Take 40 mg by mouth daily.    Historical Provider, MD  HYDROcodone-acetaminophen (NORCO/VICODIN) 5-325 MG per tablet Take 1-2 tablets by mouth every 4 (four) hours as needed for moderate pain. 01/10/14   Orpah Greek, MD  omeprazole (PRILOSEC) 20 MG capsule Take 40 mg by mouth 2 (two) times daily before a meal.     Historical Provider, MD  OXcarbazepine (TRILEPTAL) 150 MG tablet Take 150 mg by mouth 2 (two) times daily.    Historical Provider, MD   BP 113/64 mmHg  Pulse 74  Temp(Src) 98.2 F (36.8 C) (Oral)  Resp 16  SpO2 99%   Physical Exam  Constitutional: She is oriented to person, place, and time. She appears well-developed and well-nourished. No distress.  HENT:  Head: Normocephalic and atraumatic.  Mouth/Throat: Oropharynx is clear and moist.  Eyes: Conjunctivae and EOM are normal. Pupils are equal, round, and reactive to light.  Neck: Normal range of motion. Neck supple.  Cardiovascular: Normal rate, regular rhythm and normal heart sounds.   Pulmonary/Chest: Effort normal and breath  sounds normal. No respiratory distress.  Abdominal: Soft. Bowel sounds are normal. There is no tenderness. There is no guarding.  Musculoskeletal: Normal range of motion.  Left arm normal in appearance; no swelling or bony deformity; no overlying skin changes; compartments soft, easily compressible; normal grip strength; arm is NVI  Neurological: She is alert and oriented to person, place, and time.  Skin: Skin is warm and dry. She is not diaphoretic.  Psychiatric: She has a normal mood and affect.  Nursing note and vitals reviewed.   ED Course  Procedures (including critical care  time) Labs Review Labs Reviewed  I-STAT CHEM 8, ED - Abnormal; Notable for the following:    Chloride 99 (*)    All other components within normal limits  I-STAT TROPOININ, ED    Imaging Review No results found. I have personally reviewed and evaluated these lab results as part of my medical decision-making.   EKG Interpretation   Date/Time:  Saturday October 09 2014 12:21:03 EDT Ventricular Rate:  69 PR Interval:  162 QRS Duration: 96 QT Interval:  414 QTC Calculation: 443 R Axis:   -5 Text Interpretation:  Normal sinus rhythm Possible Left atrial enlargement  Incomplete right bundle branch block Borderline ECG No significant change  since last tracing Confirmed by Winfred Leeds  MD, SAM [phone]) on 10/09/2014  12:37:05 PM      MDM   Final diagnoses:  Left arm pain   69 year old female here with left arm pain. No injury or trauma. No chest pain or shortness of breath. Patient is afebrile, nontoxic. Arm is overall normal in appearance-- no swelling, varicosities, bony deformities, overlying skin changes, or warmth to touch. Compartments are soft, easily compressible. Arm is neurovascularly intact with normal grip strength. No clinical signs of DVT.  Given patient's age, EKG, troponin, and chemistry panel were obtained which are all negative.  Patient remains asymptomatic here in the emergency department and vital signs are stable. At this time, lower suspicion for atypical ACS, PE, dissection, or other acute cardiac event. Patient is neurologically intact.  Patient appears stable for discharge.  Encouraged tylenol/motrin PRN pain.  FU with PCP.  Discussed plan with patient, he/she acknowledged understanding and agreed with plan of care.  Return precautions given for new or worsening symptoms.  Case discussed with attending physician, Dr. Winfred Leeds, who evaluated patient and agrees with assessment and plan of care.   Larene Pickett, PA-C 10/09/14 [place],  MD 10/09/14 [phone]

## 2014-10-09 NOTE — ED Notes (Signed)
Phlebotomy at bedside.

## 2014-11-19 ENCOUNTER — Encounter: Payer: Self-pay | Admitting: Neurology

## 2014-11-19 ENCOUNTER — Ambulatory Visit (INDEPENDENT_AMBULATORY_CARE_PROVIDER_SITE_OTHER): Payer: Medicare Other | Admitting: Neurology

## 2014-11-19 VITALS — BP 104/64 | HR 79 | Ht 62.0 in | Wt 145.5 lb

## 2014-11-19 DIAGNOSIS — G40209 Localization-related (focal) (partial) symptomatic epilepsy and epileptic syndromes with complex partial seizures, not intractable, without status epilepticus: Secondary | ICD-10-CM

## 2014-11-19 NOTE — Progress Notes (Signed)
Reason for visit: Seizures  Sabrina Guerrero is an 69 y.o. female  History of present illness:  Sabrina Guerrero is a 69 year old right-handed black female with a history of seizures. The patient is on low-dose Trileptal, taking 150 mg twice daily. The patient is tolerating the medication well, she has not had any recurring seizure-type events. She does operate a Teacher, music. She gets her medications to the [facility]. She recently was seen in the hospital on 10/09/2014 with left arm discomfort, no cardiac issues were identified. The patient returns for an evaluation. She has not had any new medical issues that have come up since last seen. The patient reports mild memory issues, no progression has been noted over time.  Past Medical History  Diagnosis Date  . Ventral hernia   . Diabetes mellitus without complication ([place])   . Gastroesophageal reflux disease   . Partial symptomatic epilepsy with complex partial seizures, not intractable, without status epilepticus ([place]) 05/20/2014  . Memory deficit 05/20/2014    Past Surgical History  Procedure Laterality Date  . Cardiac catheterization    . Ventral hernia repair    . Tubal ligation      Family History  Problem Relation Age of Onset  . Arthritis Mother   . Cancer Brother     lung    Social history:  reports that she has never smoked. She has never used smokeless tobacco. She reports that she does not drink alcohol or use illicit drugs.    Allergies  Allergen Reactions  . Keppra [Levetiracetam]     Malaise    Medications:  Prior to Admission medications   Medication Sig Start Date End Date Taking? Authorizing Provider  atorvastatin (LIPITOR) 80 MG tablet Take 40 mg by mouth daily.    Historical Provider, MD  HYDROcodone-acetaminophen (NORCO/VICODIN) 5-325 MG per tablet Take 1-2 tablets by mouth every 4 (four) hours as needed for moderate pain. 01/10/14   Orpah Greek, MD  omeprazole (PRILOSEC) 20 MG capsule Take 40  mg by mouth 2 (two) times daily before a meal.     Historical Provider, MD  OXcarbazepine (TRILEPTAL) 150 MG tablet Take 150 mg by mouth 2 (two) times daily.    Historical Provider, MD    ROS:  Out of a complete 14 system review of symptoms, the patient complains only of the following symptoms, and all other reviewed systems are negative.  History of seizures  Blood pressure 104/64, pulse 79, height 5\' 2"  (1.575 m), weight 145 lb 8 oz (65.998 kg).  Physical Exam  General: The patient is alert and cooperative at the time of the examination.  Skin: No significant peripheral edema is noted.   Neurologic Exam  Mental status: The patient is alert and oriented x 3 at the time of the examination. The patient has apparent normal recent and remote memory, with an apparently normal attention span and concentration ability. Mini-Mental Status Examination done today shows a total score 29/30. The patient is able to name 11 animals in 30 seconds.   Cranial nerves: Facial symmetry is present. Speech is normal, no aphasia or dysarthria is noted. Extraocular movements are full. Visual fields are full.  Motor: The patient has good strength in all 4 extremities.  Sensory examination: Soft touch sensation is symmetric on the face, arms, and legs.  Coordination: The patient has good finger-nose-finger and heel-to-shin bilaterally.  Gait and station: The patient has a normal gait. Tandem gait is slightly unsteady. Romberg is negative.  No drift is seen.  Reflexes: Deep tendon reflexes are symmetric, but are depressed.   Assessment/Plan:  1. History of seizures, well controlled  2. Mild memory disturbance  The patient is doing relatively well at this point. The patient will continue the Trileptal, we will follow her over time, she will be seen back in about 6 months. She will contact our office if new problems arise. The memory issues will be followed.  Jill Alexanders MD 11/19/2014 8:24  AM  [facility] [address] [place], [place] [address]  Phone [phone] Fax [phone]

## 2014-11-19 NOTE — Patient Instructions (Signed)

## 2015-05-23 ENCOUNTER — Ambulatory Visit: Payer: Medicare Other | Admitting: Adult Health

## 2015-05-31 ENCOUNTER — Encounter: Payer: Self-pay | Admitting: Adult Health

## 2015-05-31 ENCOUNTER — Ambulatory Visit (INDEPENDENT_AMBULATORY_CARE_PROVIDER_SITE_OTHER): Payer: Medicare Other | Admitting: Adult Health

## 2015-05-31 VITALS — BP 107/67 | HR 70 | Ht 62.0 in | Wt 145.0 lb

## 2015-05-31 DIAGNOSIS — R569 Unspecified convulsions: Secondary | ICD-10-CM

## 2015-05-31 DIAGNOSIS — Z5181 Encounter for therapeutic drug level monitoring: Secondary | ICD-10-CM | POA: Diagnosis not present

## 2015-05-31 NOTE — Progress Notes (Signed)
PATIENT: Sabrina Guerrero DOB: [birth year]  REASON FOR VISIT: follow up- seizure events HISTORY FROM: patient  HISTORY OF PRESENT ILLNESS: Sabrina Guerrero is a 70 year old female with a history of seizure events. She returns today for follow-up. She reports that she has not had any additional seizure events. She has continued on Trileptal 150 mg twice a day. She reports that she is able to complete all ADLs independently. She operates a Teacher, music without difficulty. Denies any changes with her gait or balance. Denies any changes with her mood or behavior. She receives her medication through the [facility]. She denies any new neurological symptoms. She returns today for an evaluation.  HISTORY 11/19/14 (Sabrina Guerrero): Sabrina Guerrero is a 70 year old right-handed black female with a history of seizures. The patient is on low-dose Trileptal, taking 150 mg twice daily. The patient is tolerating the medication well, she has not had any recurring seizure-type events. She does operate a Teacher, music. She gets her medications to the [facility]. She recently was seen in the hospital on 10/09/2014 with left arm discomfort, no cardiac issues were identified. The patient returns for an evaluation. She has not had any new medical issues that have come up since last seen. The patient reports mild memory issues, no progression has been noted over time.  REVIEW OF SYSTEMS: Out of a complete 14 system review of symptoms, the patient complains only of the following symptoms, and all other reviewed systems are negative.  Blurred vision, trouble swallowing memory loss  ALLERGIES: Allergies  Allergen Reactions  . Keppra [Levetiracetam]     Malaise    HOME MEDICATIONS: Outpatient Prescriptions Prior to Visit  Medication Sig Dispense Refill  . omeprazole (PRILOSEC) 20 MG capsule Take 40 mg by mouth 2 (two) times daily before a meal.     . OXcarbazepine (TRILEPTAL) 150 MG tablet Take 150 mg by mouth 2 (two) times daily.      No facility-administered medications prior to visit.    PAST MEDICAL HISTORY: Past Medical History  Diagnosis Date  . Ventral hernia   . Diabetes mellitus without complication ([place])   . Gastroesophageal reflux disease   . Partial symptomatic epilepsy with complex partial seizures, not intractable, without status epilepticus ([place]) 05/20/2014  . Memory deficit 05/20/2014    PAST SURGICAL HISTORY: Past Surgical History  Procedure Laterality Date  . Cardiac catheterization    . Ventral hernia repair    . Tubal ligation      FAMILY HISTORY: Family History  Problem Relation Age of Onset  . Arthritis Mother   . Cancer Brother     lung    SOCIAL HISTORY: Social History   Social History  . Marital Status: Divorced    Spouse Name: N/A  . Number of Children: 3  . Years of Education: 18 years   Occupational History  . unemployed    Social History Main Topics  . Smoking status: Never Smoker   . Smokeless tobacco: Never Used  . Alcohol Use: No  . Drug Use: No  . Sexual Activity: No   Other Topics Concern  . Not on file   Social History Narrative   Patient is right handed.   Patient does not drink caffeine.      PHYSICAL EXAM  Filed Vitals:   05/31/15 1003  BP: 107/67  Pulse: 70  Height: 5\' 2"  (1.575 m)  Weight: 145 lb (65.772 kg)   Body mass index is 26.51 kg/(m^2).  Generalized: Well developed, in  no acute distress   Neurological examination  Mentation: Alert oriented to time, place, history taking. Follows all commands speech and language fluent Cranial nerve II-XII: Pupils were equal round reactive to light. Extraocular movements were full, visual field were full on confrontational test. Facial sensation and strength were normal. Uvula tongue midline. Head turning and shoulder shrug  were normal and symmetric. Motor: The motor testing reveals 5 over 5 strength of all 4 extremities. Good symmetric motor tone is noted throughout.  Sensory: Sensory  testing is intact to soft touch on all 4 extremities. No evidence of extinction is noted.  Coordination: Cerebellar testing reveals good finger-nose-finger and heel-to-shin bilaterally.  Gait and station: Gait is normal. Tandem gait is normal. Romberg is negative. No drift is seen.  Reflexes: Deep tendon reflexes are symmetric and normal bilaterally.   DIAGNOSTIC DATA (LABS, IMAGING, TESTING) - I reviewed patient records, labs, notes, testing and imaging myself where available.  Lab Results  Component Value Date   WBC 3.4 05/20/2014   HGB 14.3 10/09/2014   HCT 42.0 10/09/2014   MCV 95 05/20/2014   PLT 193 05/20/2014      Component Value Date/Time   NA 137 10/09/2014 1224   NA 141 05/20/2014 0830   K 3.7 10/09/2014 1224   CL 99* 10/09/2014 1224   CO2 29 05/20/2014 0830   GLUCOSE 85 10/09/2014 1224   GLUCOSE 92 05/20/2014 0830   BUN 13 10/09/2014 1224   BUN 12 05/20/2014 0830   CREATININE 0.70 10/09/2014 1224   CALCIUM 9.3 05/20/2014 0830   PROT 6.8 05/20/2014 0830   PROT 7.4 01/08/2013 1046   ALBUMIN 3.9 05/20/2014 0830   ALBUMIN 3.7 01/08/2013 1046   AST 18 05/20/2014 0830   ALT 13 05/20/2014 0830   ALKPHOS 68 05/20/2014 0830   BILITOT 0.2 05/20/2014 0830   BILITOT 0.3 01/08/2013 1046   GFRNONAA 81 05/20/2014 0830   GFRAA 93 05/20/2014 0830       ASSESSMENT AND PLAN 70 y.o. year old female  has a past medical history of Ventral hernia; Diabetes mellitus without complication ([place]); Gastroesophageal reflux disease; Partial symptomatic epilepsy with complex partial seizures, not intractable, without status epilepticus ([place]) (05/20/2014); and Memory deficit (05/20/2014). here with:  1. Seizures  Overall the patient is doing well. She will continue on Trileptal 150 mg twice a day. I will check blood work today. Patient advised that if she has any seizure event she should let us know. She will follow-up in 6 months or sooner if needed.     Sabrina Givens, MSN, NP-C  05/31/2015, 10:32 AM [facility] [address] [place], [place] [address] [phone]

## 2015-05-31 NOTE — Progress Notes (Signed)
I agree with the evaluation and plan detailed above by Ward Givens, NP.

## 2015-05-31 NOTE — Patient Instructions (Signed)
We will check blood work today Continue Trileptal 150 mg twice a day. If your symptoms worsen or you develop new symptoms please let us know.

## 2015-06-02 LAB — COMPREHENSIVE METABOLIC PANEL
ALBUMIN: 3.8 g/dL (ref 3.6–4.8)
ALT: 9 IU/L (ref 0–32)
AST: 17 IU/L (ref 0–40)
Albumin/Globulin Ratio: 1.1 — ABNORMAL LOW (ref 1.2–2.2)
Alkaline Phosphatase: 80 IU/L (ref 39–117)
BUN/Creatinine Ratio: 13 (ref 12–28)
BUN: 8 mg/dL (ref 8–27)
Bilirubin Total: 0.2 mg/dL (ref 0.0–1.2)
CALCIUM: 9.4 mg/dL (ref 8.7–10.3)
CO2: 25 mmol/L (ref 18–29)
CREATININE: 0.61 mg/dL (ref 0.57–1.00)
Chloride: 96 mmol/L (ref 96–106)
GFR, EST AFRICAN AMERICAN: 107 mL/min/{1.73_m2} (ref >59)
GFR, EST NON AFRICAN AMERICAN: 93 mL/min/{1.73_m2} (ref >59)
GLUCOSE: 94 mg/dL (ref 65–99)
Globulin, Total: 3.4 g/dL (ref 1.5–4.5)
Potassium: 4.3 mmol/L (ref 3.5–5.2)
Sodium: 135 mmol/L (ref 134–144)
TOTAL PROTEIN: 7.2 g/dL (ref 6.0–8.5)

## 2015-06-02 LAB — CBC WITH DIFFERENTIAL/PLATELET
BASOS ABS: 0 10*3/uL (ref 0.0–0.2)
BASOS: 1 %
EOS (ABSOLUTE): 0.1 10*3/uL (ref 0.0–0.4)
EOS: 2 %
HEMATOCRIT: 35.8 % (ref 34.0–46.6)
Hemoglobin: 11.8 g/dL (ref 11.1–15.9)
IMMATURE GRANS (ABS): 0 10*3/uL (ref 0.0–0.1)
Immature Granulocytes: 0 %
LYMPHS: 49 %
Lymphocytes Absolute: 1.9 10*3/uL (ref 0.7–3.1)
MCH: 31 pg (ref 26.6–33.0)
MCHC: 33 g/dL (ref 31.5–35.7)
MCV: 94 fL (ref 79–97)
Monocytes Absolute: 0.3 10*3/uL (ref 0.1–0.9)
Monocytes: 9 %
Neutrophils Absolute: 1.5 10*3/uL (ref 1.4–7.0)
Neutrophils: 39 %
Platelets: 207 10*3/uL (ref 150–379)
RBC: 3.81 x10E6/uL (ref 3.77–5.28)
RDW: 13.6 % (ref 12.3–15.4)
WBC: 3.8 10*3/uL (ref 3.4–10.8)

## 2015-06-02 LAB — 10-HYDROXYCARBAZEPINE: OXCARBAZEPINE SERPL-MCNC: 6 ug/mL — AB (ref 10–35)

## 2015-06-06 ENCOUNTER — Telehealth: Payer: Self-pay | Admitting: Adult Health

## 2015-06-06 NOTE — Telephone Encounter (Signed)
-----   Message from Ward Givens, NP sent at 06/02/2015  3:12 PM EDT ----- Lab work ok. Please call patient.

## 2015-06-06 NOTE — Telephone Encounter (Signed)
Called and left message for patient relaying labs were ok. If any questions to please return my phone call.

## 2015-06-06 NOTE — Progress Notes (Signed)
Quick Note:  Called and left message for patient that labs were ok . ______

## 2015-10-06 ENCOUNTER — Encounter (HOSPITAL_COMMUNITY): Payer: Self-pay | Admitting: Emergency Medicine

## 2015-10-06 ENCOUNTER — Emergency Department (HOSPITAL_COMMUNITY)
Admission: EM | Admit: 2015-10-06 | Discharge: 2015-10-06 | Disposition: A | Discharge status: Home / Self Care | Payer: Medicare Other | Attending: Dermatology | Admitting: Dermatology

## 2015-10-06 DIAGNOSIS — E119 Type 2 diabetes mellitus without complications: Secondary | ICD-10-CM | POA: Insufficient documentation

## 2015-10-06 DIAGNOSIS — Z5321 Procedure and treatment not carried out due to patient leaving prior to being seen by health care provider: Secondary | ICD-10-CM | POA: Diagnosis not present

## 2015-10-06 DIAGNOSIS — R51 Headache: Secondary | ICD-10-CM | POA: Diagnosis not present

## 2015-10-06 NOTE — ED Triage Notes (Signed)
Pt states 1 hour pta c/o pain in back of her head. Pt in NAD, ambulatory, no neuro deficits.

## 2015-10-06 NOTE — ED Notes (Signed)
Pt states

## 2015-11-29 ENCOUNTER — Ambulatory Visit (INDEPENDENT_AMBULATORY_CARE_PROVIDER_SITE_OTHER): Payer: Medicare Other | Admitting: Adult Health

## 2015-11-29 ENCOUNTER — Encounter: Payer: Self-pay | Admitting: Adult Health

## 2015-11-29 VITALS — BP 90/51 | HR 68 | Ht 62.0 in | Wt 143.6 lb

## 2015-11-29 DIAGNOSIS — Z5181 Encounter for therapeutic drug level monitoring: Secondary | ICD-10-CM | POA: Diagnosis not present

## 2015-11-29 DIAGNOSIS — R569 Unspecified convulsions: Secondary | ICD-10-CM

## 2015-11-29 NOTE — Patient Instructions (Signed)
Continue Trileptal Blood work today If you have any seizure events please let us know.

## 2015-11-29 NOTE — Progress Notes (Signed)
PATIENT: Sabrina Guerrero DOB: 07-01-1945  REASON FOR VISIT: follow up-seizures HISTORY FROM: patient  HISTORY OF PRESENT ILLNESS: Sabrina Guerrero is a 70 year old female with a history of seizure events. She returns today for follow-up. Overall she reports she is doing well. She continues to take Trileptal 150 mg twice a day. She denies any seizure events. She lives at home with her grandchildren. She is able to complete all ADLs independently. She operates a Teacher, music without difficulty. She denies any new neurological symptoms. She returns today for an evaluation.  HISTORY 05/31/15 (MM): Sabrina Guerrero is a 70 year old female with a history of seizure events. She returns today for follow-up. She reports that she has not had any additional seizure events. She has continued on Trileptal 150 mg twice a day. She reports that she is able to complete all ADLs independently. She operates a Teacher, music without difficulty. Denies any changes with her gait or balance. Denies any changes with her mood or behavior. She receives her medication through the [facility]. She denies any new neurological symptoms. She returns today for an evaluation.  HISTORY 11/19/14 ([place]): Sabrina Guerrero is a 70 year old right-handed black female with a history of seizures. The patient is on low-dose Trileptal, taking 150 mg twice daily. The patient is tolerating the medication well, she has not had any recurring seizure-type events. She does operate a Teacher, music. She gets her [facility] to the [facility]. She recently was seen in the hospital on 10/09/2014 with left arm discomfort, no cardiac issues were identified. The patient returns for an evaluation. She has not had any new medical issues that have come up since last seen. The patient reports mild memory issues, no progression has been noted over time.   REVIEW OF SYSTEMS: Out of a complete 14 system review of symptoms, the patient complains only of the following  symptoms, and all other reviewed systems are negative.  Memory loss, headache, weakness, confusion, back pain  ALLERGIES: Allergies  Allergen Reactions  . Keppra [Levetiracetam]     Malaise    HOME MEDICATIONS: Outpatient Medications Prior to Visit  Medication Sig Dispense Refill  . omeprazole (PRILOSEC) 20 MG capsule Take 40 mg by mouth 2 (two) times daily before a meal.     . OXcarbazepine (TRILEPTAL) 150 MG tablet Take 150 mg by mouth 2 (two) times daily.     No facility-administered medications prior to visit.     PAST MEDICAL HISTORY: Past Medical History:  Diagnosis Date  . Diabetes mellitus without complication ([place])   . Gastroesophageal reflux disease   . Memory deficit 05/20/2014  . Partial symptomatic epilepsy with complex partial seizures, not intractable, without status epilepticus ([place]) 05/20/2014  . Ventral hernia     PAST SURGICAL HISTORY: Past Surgical History:  Procedure Laterality Date  . CARDIAC CATHETERIZATION    . TUBAL LIGATION    . VENTRAL HERNIA REPAIR      FAMILY HISTORY: Family History  Problem Relation Age of Onset  . Arthritis Mother   . Cancer Brother     lung    SOCIAL HISTORY: Social History   Social History  . Marital status: Divorced    Spouse name: N/A  . Number of children: 3  . Years of education: 18 years   Occupational History  . unemployed    Social History Main Topics  . Smoking status: Never Smoker  . Smokeless tobacco: Never Used  . Alcohol use No  . Drug use: No  .  Sexual activity: No   Other Topics Concern  . Not on file   Social History Narrative   Patient is right handed.   Patient does not drink caffeine.      PHYSICAL EXAM  Vitals:   11/29/15 0822  BP: (!) 90/51  Pulse: 68  Weight: 143 lb 9.6 oz (65.1 kg)  Height: 5\' 2"  (1.575 m)   Body mass index is 26.26 kg/m.  Generalized: Well developed, in no acute distress   Neurological examination  Mentation: Alert oriented to time, place,  history taking. Follows all commands speech and language fluent Cranial nerve II-XII: Pupils were equal round reactive to light. Extraocular movements were full, visual field were full on confrontational test. Facial sensation and strength were normal. Uvula tongue midline. Head turning and shoulder shrug  were normal and symmetric. Motor: The motor testing reveals 5 over 5 strength of all 4 extremities. Good symmetric motor tone is noted throughout.  Sensory: Sensory testing is intact to soft touch on all 4 extremities. No evidence of extinction is noted.  Coordination: Cerebellar testing reveals good finger-nose-finger and heel-to-shin bilaterally.  Gait and station: Gait is normal. Tandem gait is normal. Romberg is negative. No drift is seen.  Reflexes: Deep tendon reflexes are symmetric and normal bilaterally.   DIAGNOSTIC DATA (LABS, IMAGING, TESTING) - I reviewed patient records, labs, notes, testing and imaging myself where available.  Lab Results  Component Value Date   WBC 3.8 05/31/2015   HGB 14.3 10/09/2014   HCT 35.8 05/31/2015   MCV 94 05/31/2015   PLT 207 05/31/2015      Component Value Date/Time   NA 135 05/31/2015 1049   K 4.3 05/31/2015 1049   CL 96 05/31/2015 1049   CO2 25 05/31/2015 1049   GLUCOSE 94 05/31/2015 1049   GLUCOSE 85 10/09/2014 1224   BUN 8 05/31/2015 1049   CREATININE 0.61 05/31/2015 1049   CALCIUM 9.4 05/31/2015 1049   PROT 7.2 05/31/2015 1049   ALBUMIN 3.8 05/31/2015 1049   AST 17 05/31/2015 1049   ALT 9 05/31/2015 1049   ALKPHOS 80 05/31/2015 1049   BILITOT 0.2 05/31/2015 1049   GFRNONAA 93 05/31/2015 1049   GFRAA 107 05/31/2015 1049     ASSESSMENT AND PLAN 70 y.o. year old female  has a past medical history of Diabetes mellitus without complication ([place]); Gastroesophageal reflux disease; Memory deficit (05/20/2014); Partial symptomatic epilepsy with complex partial seizures, not intractable, without status epilepticus ([place]) (05/20/2014); and  Ventral hernia. here with:  1. Seizures  Overall the patient is doing well. She will continue on Trileptal. I will check blood work today. Patient advised that if she has any seizure event she should let us know. She will follow-up in one year with Dr. Jannifer Franklin.     Ward Givens, MSN, NP-C 11/29/2015, 8:37 AM [facility] [address], [place], [place] [address] [phone]

## 2015-11-29 NOTE — Progress Notes (Signed)
I have read the note, and I agree with the clinical assessment and plan.  Sabrina Guerrero,Sabrina Guerrero   

## 2015-12-01 ENCOUNTER — Telehealth: Payer: Self-pay | Admitting: *Deleted

## 2015-12-01 LAB — CBC WITH DIFFERENTIAL/PLATELET
BASOS: 1 %
Basophils Absolute: 0 10*3/uL (ref 0.0–0.2)
EOS (ABSOLUTE): 0.1 10*3/uL (ref 0.0–0.4)
Eos: 3 %
Hematocrit: 35.9 % (ref 34.0–46.6)
Hemoglobin: 11.9 g/dL (ref 11.1–15.9)
Immature Grans (Abs): 0 10*3/uL (ref 0.0–0.1)
Immature Granulocytes: 0 %
LYMPHS ABS: 1.4 10*3/uL (ref 0.7–3.1)
Lymphs: 37 %
MCH: 31 pg (ref 26.6–33.0)
MCHC: 33.1 g/dL (ref 31.5–35.7)
MCV: 94 fL (ref 79–97)
MONOS ABS: 0.3 10*3/uL (ref 0.1–0.9)
Monocytes: 8 %
Neutrophils Absolute: 2 10*3/uL (ref 1.4–7.0)
Neutrophils: 51 %
Platelets: 201 10*3/uL (ref 150–379)
RBC: 3.84 x10E6/uL (ref 3.77–5.28)
RDW: 13.6 % (ref 12.3–15.4)
WBC: 3.9 10*3/uL (ref 3.4–10.8)

## 2015-12-01 LAB — COMPREHENSIVE METABOLIC PANEL
A/G RATIO: 1.2 (ref 1.2–2.2)
ALBUMIN: 3.9 g/dL (ref 3.6–4.8)
ALK PHOS: 83 IU/L (ref 39–117)
ALT: 8 IU/L (ref 0–32)
AST: 14 IU/L (ref 0–40)
BUN / CREAT RATIO: 14 (ref 12–28)
BUN: 11 mg/dL (ref 8–27)
Bilirubin Total: 0.2 mg/dL (ref 0.0–1.2)
CO2: 26 mmol/L (ref 18–29)
CREATININE: 0.79 mg/dL (ref 0.57–1.00)
Calcium: 9.3 mg/dL (ref 8.7–10.3)
Chloride: 100 mmol/L (ref 96–106)
GFR calc Af Amer: 88 mL/min/{1.73_m2} (ref >59)
GFR, EST NON AFRICAN AMERICAN: 77 mL/min/{1.73_m2} (ref >59)
GLOBULIN, TOTAL: 3.3 g/dL (ref 1.5–4.5)
Glucose: 99 mg/dL (ref 65–99)
POTASSIUM: 4.1 mmol/L (ref 3.5–5.2)
SODIUM: 140 mmol/L (ref 134–144)
Total Protein: 7.2 g/dL (ref 6.0–8.5)

## 2015-12-01 LAB — 10-HYDROXYCARBAZEPINE: Oxcarbazepine SerPl-Mcnc: 6 ug/mL — ABNORMAL LOW (ref 10–35)

## 2015-12-01 NOTE — Telephone Encounter (Signed)
Attempted to reach daughter, Andee Poles. Phone call was picked up, but no one spoke.  Called other daughter, Benancio Deeds and informed her, per Edman Circle, NP that her mother's lab results are unremarkable, nothing concerning for [facility]. She verbalized understanding, appreciation for call.

## 2016-08-14 ENCOUNTER — Telehealth: Payer: Self-pay | Admitting: *Deleted

## 2016-08-14 NOTE — Telephone Encounter (Signed)
Called and LVM for pt to call to discuss disability paperwork we received. Need to get more information as to why she is requesting disability. Advised office closed. Open 8-5am tomorrow.

## 2016-08-15 NOTE — Telephone Encounter (Signed)
Called patient again. She stated she did not receive missed call. She stated paperwork she is requesting Dr Jannifer Franklin to fill out is for social security/military disability.  States Dr Jannifer Franklin follows her for her seizures.  She states back in 2012 when brother in hospital dying, she cannot remember anything. She recently, within the past year has started to remember more.  She stopped working about 6-7 years ago.  Advised I will give to Dr Jannifer Franklin to review. Will call if any more questions. She verbalized understanding.

## 2016-08-16 NOTE — Telephone Encounter (Signed)
Gave completed/signed disability paperwork to medical records to process for pt. 

## 2016-11-28 ENCOUNTER — Ambulatory Visit: Payer: Medicare Other | Admitting: Neurology

## 2016-11-28 ENCOUNTER — Telehealth: Payer: Self-pay | Admitting: Neurology

## 2016-11-28 NOTE — Telephone Encounter (Signed)
This patient did not show for a revisit appointment today. 

## 2016-11-29 ENCOUNTER — Encounter: Payer: Self-pay | Admitting: Neurology

## 2017-01-24 ENCOUNTER — Ambulatory Visit (INDEPENDENT_AMBULATORY_CARE_PROVIDER_SITE_OTHER): Payer: Medicare Other | Admitting: Neurology

## 2017-01-24 ENCOUNTER — Encounter: Payer: Self-pay | Admitting: Neurology

## 2017-01-24 VITALS — BP 114/66 | HR 74 | Ht 62.0 in | Wt 156.5 lb

## 2017-01-24 DIAGNOSIS — G40209 Localization-related (focal) (partial) symptomatic epilepsy and epileptic syndromes with complex partial seizures, not intractable, without status epilepticus: Secondary | ICD-10-CM

## 2017-01-24 DIAGNOSIS — Z5181 Encounter for therapeutic drug level monitoring: Secondary | ICD-10-CM | POA: Diagnosis not present

## 2017-01-24 MED ORDER — OXCARBAZEPINE 150 MG PO TABS: 150.0000 mg | ORAL_TABLET | Freq: Two times a day (BID) | ORAL | 3 refills | Status: DC

## 2017-01-24 NOTE — Progress Notes (Signed)
Reason for visit: Seizures  Sabrina Guerrero is an 71 y.o. female  History of present illness:  Sabrina. Guerrero is a 71 year old right-handed black female with a history of seizure type events associated with staring episodes lasting about 2 minutes.  The patient has had an abnormal EEG evaluation showing right temporal sharp wave activity.  She has been placed on low-dose Trileptal taking 150 mg twice daily, she has not had any seizures in several years, the last known seizure was in January 2015.  The patient is able to operate a motor vehicle without difficulty.  She tolerates the medication well.  She returns for routine reevaluation.  Past Medical History:  Diagnosis Date  . Diabetes mellitus without complication ([place])   . Gastroesophageal reflux disease   . Memory deficit 05/20/2014  . Partial symptomatic epilepsy with complex partial seizures, not intractable, without status epilepticus ([place]) 05/20/2014  . Ventral hernia     Past Surgical History:  Procedure Laterality Date  . CARDIAC CATHETERIZATION    . TUBAL LIGATION    . VENTRAL HERNIA REPAIR      Family History  Problem Relation Age of Onset  . Arthritis Mother   . Cancer Brother        lung    Social history:  reports that  has never smoked. she has never used smokeless tobacco. She reports that she does not drink alcohol or use drugs.    Allergies  Allergen Reactions  . Keppra [Levetiracetam]     Malaise    Medications:  Prior to Admission medications   Medication Sig Start Date End Date Taking? Authorizing Provider  omeprazole (PRILOSEC) 20 MG capsule Take 40 mg by mouth 2 (two) times daily before a meal.    Yes [provider]  OXcarbazepine (TRILEPTAL) 150 MG tablet Take 150 mg by mouth 2 (two) times daily.   Yes [provider]  psyllium (METAMUCIL) 58.6 % powder Take 1 packet by mouth 2 (two) times daily.   Yes [provider]    ROS:  Out of a complete 14 system review of  symptoms, the patient complains only of the following symptoms, and all other reviewed systems are negative.  History of seizures  Blood pressure 114/66, pulse 74, height 5\' 2"  (1.575 m), weight 156 lb 8 oz (71 kg).  Physical Exam  General: The patient is alert and cooperative at the time of the examination.  Skin: No significant peripheral edema is noted.   Neurologic Exam  Mental status: The patient is alert and oriented x 3 at the time of the examination. The patient has apparent normal recent and remote memory, with an apparently normal attention span and concentration ability.   Cranial nerves: Facial symmetry is present. Speech is normal, no aphasia or dysarthria is noted. Extraocular movements are full. Visual fields are full.  Motor: The patient has good strength in all 4 extremities.  Sensory examination: Soft touch sensation is symmetric on the face, arms, and legs.  Coordination: The patient has good finger-nose-finger and heel-to-shin bilaterally.  Gait and station: The patient has a normal gait. Tandem gait is normal. Romberg is negative. No drift is seen.  Reflexes: Deep tendon reflexes are symmetric.   Assessment/Plan:  1.  Seizure disorder  The patient has done well on Trileptal, she will remain on this medication for now, she was given a prescription for this.  She gets her medications through the [facility].  She will have blood work  done today, she will follow-up in 1 year, sooner if needed.   Jill Alexanders MD 01/24/2017 1:28 PM  [facility] [address] [place], [place] [address]  Phone [phone] Fax [phone]

## 2017-01-26 LAB — COMPREHENSIVE METABOLIC PANEL
ALBUMIN: 3.8 g/dL (ref 3.5–4.8)
ALT: 8 IU/L (ref 0–32)
AST: 16 IU/L (ref 0–40)
Albumin/Globulin Ratio: 1.1 — ABNORMAL LOW (ref 1.2–2.2)
Alkaline Phosphatase: 89 IU/L (ref 39–117)
BUN/Creatinine Ratio: 19 (ref 12–28)
BUN: 15 mg/dL (ref 8–27)
Bilirubin Total: <0.2 mg/dL (ref 0.0–1.2)
CO2: 22 mmol/L (ref 20–29)
Calcium: 9.2 mg/dL (ref 8.7–10.3)
Chloride: 104 mmol/L (ref 96–106)
Creatinine, Ser: 0.8 mg/dL (ref 0.57–1.00)
GFR calc non Af Amer: 75 mL/min/{1.73_m2} (ref >59)
GFR, EST AFRICAN AMERICAN: 86 mL/min/{1.73_m2} (ref >59)
GLUCOSE: 121 mg/dL — AB (ref 65–99)
Globulin, Total: 3.5 g/dL (ref 1.5–4.5)
Potassium: 4.2 mmol/L (ref 3.5–5.2)
Sodium: 140 mmol/L (ref 134–144)
TOTAL PROTEIN: 7.3 g/dL (ref 6.0–8.5)

## 2017-01-26 LAB — CBC WITH DIFFERENTIAL/PLATELET
Basophils Absolute: 0 10*3/uL (ref 0.0–0.2)
Basos: 0 %
EOS (ABSOLUTE): 0.2 10*3/uL (ref 0.0–0.4)
Eos: 3 %
HEMOGLOBIN: 12 g/dL (ref 11.1–15.9)
Hematocrit: 36.1 % (ref 34.0–46.6)
IMMATURE GRANS (ABS): 0 10*3/uL (ref 0.0–0.1)
Immature Granulocytes: 0 %
LYMPHS ABS: 2.1 10*3/uL (ref 0.7–3.1)
LYMPHS: 47 %
MCH: 31.5 pg (ref 26.6–33.0)
MCHC: 33.2 g/dL (ref 31.5–35.7)
MCV: 95 fL (ref 79–97)
MONOCYTES: 8 %
Monocytes Absolute: 0.4 10*3/uL (ref 0.1–0.9)
Neutrophils Absolute: 1.9 10*3/uL (ref 1.4–7.0)
Neutrophils: 42 %
Platelets: 197 10*3/uL (ref 150–379)
RBC: 3.81 x10E6/uL (ref 3.77–5.28)
RDW: 13.7 % (ref 12.3–15.4)
WBC: 4.5 10*3/uL (ref 3.4–10.8)

## 2017-01-26 LAB — 10-HYDROXYCARBAZEPINE: OXCARBAZEPINE SERPL-MCNC: 7 ug/mL — AB (ref 10–35)

## 2017-01-30 ENCOUNTER — Telehealth: Payer: Self-pay | Admitting: *Deleted

## 2017-01-30 NOTE — Telephone Encounter (Signed)
Called and LVM (ok per DPR) about lab results per CW,MD note below. Gave [facility] phone number if she has further questions/concerns.

## 2017-01-30 NOTE — Telephone Encounter (Signed)
-----   Message from Kathrynn Ducking, MD sent at 01/27/2017  5:08 PM EST -----   The blood work results are unremarkable. Trileptal level is low but stable. Continue current dosing. Please call the patient. ----- Message ----- From: Lavone Neri Lab Results In Sent: 01/25/2017   7:42 AM To: Kathrynn Ducking, MD

## 2018-01-27 ENCOUNTER — Ambulatory Visit: Payer: Medicare Other | Admitting: Adult Health

## 2018-02-03 ENCOUNTER — Encounter: Payer: Self-pay | Admitting: Adult Health

## 2018-02-03 ENCOUNTER — Ambulatory Visit (INDEPENDENT_AMBULATORY_CARE_PROVIDER_SITE_OTHER): Payer: Medicare Other | Admitting: Adult Health

## 2018-02-03 VITALS — BP 108/69 | HR 87 | Ht 62.0 in | Wt 156.6 lb

## 2018-02-03 DIAGNOSIS — Z5181 Encounter for therapeutic drug level monitoring: Secondary | ICD-10-CM

## 2018-02-03 DIAGNOSIS — G40209 Localization-related (focal) (partial) symptomatic epilepsy and epileptic syndromes with complex partial seizures, not intractable, without status epilepticus: Secondary | ICD-10-CM

## 2018-02-03 NOTE — Progress Notes (Signed)
PATIENT: Sabrina Guerrero DOB: [birth year]  REASON FOR VISIT: follow up HISTORY FROM: patient  HISTORY OF PRESENT ILLNESS: Today 02/03/18:  Sabrina Guerrero is a 72 year old female with a history of seizure type events.  She returns today for follow-up.  She is currently on Trileptal 150 mg twice a day.  She denies any seizure events.  She is tolerating the medication well.  She lives at home alone.  She is able to complete all ADLs independently.  No changes in her gait or balance.  She returns today for evaluation.  HISTORY (copied from Dr. Tobey Grim note) Sabrina Guerrero is a 72 year old right-handed black female with a history of seizure type events associated with staring episodes lasting about 2 minutes.  The patient has had an abnormal EEG evaluation showing right temporal sharp wave activity.  She has been placed on low-dose Trileptal taking 150 mg twice daily, she has not had any seizures in several years, the last known seizure was in January 2015.  The patient is able to operate a motor vehicle without difficulty.  She tolerates the medication well.  She returns for routine reevaluation.   REVIEW OF SYSTEMS: Out of a complete 14 system review of symptoms, the patient complains only of the following symptoms, and all other reviewed systems are negative.  See HPI  ALLERGIES: Allergies  Allergen Reactions  . Keppra [Levetiracetam]     Malaise    HOME MEDICATIONS: Outpatient Medications Prior to Visit  Medication Sig Dispense Refill  . omeprazole (PRILOSEC) 20 MG capsule Take 40 mg by mouth 2 (two) times daily before a meal.     . OXcarbazepine (TRILEPTAL) 150 MG tablet Take 1 tablet (150 mg total) by mouth 2 (two) times daily. 180 tablet 3  . psyllium (METAMUCIL) 58.6 % powder Take 1 packet by mouth 2 (two) times daily.     No facility-administered medications prior to visit.     PAST MEDICAL HISTORY: Past Medical History:  Diagnosis Date  . Diabetes mellitus without complication  ([place])   . Gastroesophageal reflux disease   . Memory deficit 05/20/2014  . Partial symptomatic epilepsy with complex partial seizures, not intractable, without status epilepticus ([place]) 05/20/2014  . Ventral hernia     PAST SURGICAL HISTORY: Past Surgical History:  Procedure Laterality Date  . CARDIAC CATHETERIZATION    . TUBAL LIGATION    . VENTRAL HERNIA REPAIR      FAMILY HISTORY: Family History  Problem Relation Age of Onset  . Arthritis Mother   . Cancer Brother        lung    SOCIAL HISTORY: Social History   Socioeconomic History  . Marital status: Divorced    Spouse name: Not on file  . Number of children: 3  . Years of education: 18 years  . Highest education level: Not on file  Occupational History  . Occupation: unemployed  Social Needs  . Financial resource strain: Not on file  . Food insecurity:    Worry: Not on file    Inability: Not on file  . Transportation needs:    Medical: Not on file    Non-medical: Not on file  Tobacco Use  . Smoking status: Never Smoker  . Smokeless tobacco: Never Used  Substance and Sexual Activity  . Alcohol use: No  . Drug use: No  . Sexual activity: Never  Lifestyle  . Physical activity:    Days per week: Not on file    Minutes per session: Not  on file  . Stress: Not on file  Relationships  . Social connections:    Talks on phone: Not on file    Gets together: Not on file    Attends religious service: Not on file    Active member of club or organization: Not on file    Attends meetings of clubs or organizations: Not on file    Relationship status: Not on file  . Intimate partner violence:    Fear of current or ex partner: Not on file    Emotionally abused: Not on file    Physically abused: Not on file    Forced sexual activity: Not on file  Other Topics Concern  . Not on file  Social History Narrative   Patient is right handed.   Patient does not drink caffeine.      PHYSICAL EXAM  Vitals:   02/03/18  1342  BP: 108/69  Pulse: 87  Weight: 156 lb 9.6 oz (71 kg)  Height: 5\' 2"  (1.575 m)   Body mass index is 28.64 kg/m.  Generalized: Well developed, in no acute distress   Neurological examination  Mentation: Alert oriented to time, place, history taking. Follows all commands speech and language fluent Cranial nerve II-XII: Pupils were equal round reactive to light. Extraocular movements were full, visual field were full on confrontational test. Facial sensation and strength were normal. Uvula tongue midline. Head turning and shoulder shrug  were normal and symmetric. Motor: The motor testing reveals 5 over 5 strength of all 4 extremities. Good symmetric motor tone is noted throughout.  Sensory: Sensory testing is intact to soft touch on all 4 extremities. No evidence of extinction is noted.  Coordination: Cerebellar testing reveals good finger-nose-finger and heel-to-shin bilaterally.  Gait and station: Gait is normal. Tandem gait is normal. Romberg is negative. No drift is seen.  Reflexes: Deep tendon reflexes are symmetric and normal bilaterally.   DIAGNOSTIC DATA (LABS, IMAGING, TESTING) - I reviewed patient records, labs, notes, testing and imaging myself where available.  Lab Results  Component Value Date   WBC 4.5 01/24/2017   HGB 12.0 01/24/2017   HCT 36.1 01/24/2017   MCV 95 01/24/2017   PLT 197 01/24/2017      Component Value Date/Time   NA 140 01/24/2017 1341   K 4.2 01/24/2017 1341   CL 104 01/24/2017 1341   CO2 22 01/24/2017 1341   GLUCOSE 121 (H) 01/24/2017 1341   GLUCOSE 85 10/09/2014 1224   BUN 15 01/24/2017 1341   CREATININE 0.80 01/24/2017 1341   CALCIUM 9.2 01/24/2017 1341   PROT 7.3 01/24/2017 1341   ALBUMIN 3.8 01/24/2017 1341   AST 16 01/24/2017 1341   ALT 8 01/24/2017 1341   ALKPHOS 89 01/24/2017 1341   BILITOT <0.2 01/24/2017 1341   GFRNONAA 75 01/24/2017 1341   GFRAA 86 01/24/2017 1341   Lab Results  Component Value Date   CHOL 233 (H)  11/18/2012   HDL 41.10 11/18/2012   LDLDIRECT 178.0 11/18/2012   TRIG 62.0 11/18/2012   CHOLHDL 6 11/18/2012   Lab Results  Component Value Date   HGBA1C 6.5 11/18/2012   Lab Results  Component Value Date   VITAMINB12 510 11/18/2012   Lab Results  Component Value Date   TSH 0.65 11/18/2012      ASSESSMENT AND PLAN 71 y.o. year old female  has a past medical history of Diabetes mellitus without complication ([place]), Gastroesophageal reflux disease, Memory deficit (05/20/2014), Partial symptomatic epilepsy with complex  partial seizures, not intractable, without status epilepticus ([place]) (05/20/2014), and Ventral hernia. here with:  1.  Seizures  Overall the patient is doing well.  She will continue on Trileptal.  I will check blood work today.  She is advised that if her symptoms worsen or she develops new symptoms she should let us know.  She will follow-up in 1 year or sooner if needed.   I spent 15 minutes with the patient. 50% of this time was spent reviewing her plan of care   Ward Givens, MSN, NP-C 02/03/2018, 2:03 PM [facility] [address] [place], [place] [address] [phone]

## 2018-02-03 NOTE — Progress Notes (Signed)
I have read the note, and I agree with the clinical assessment and plan.  Sabrina Guerrero   

## 2018-02-03 NOTE — Patient Instructions (Signed)
Your Plan:  Continue Trileptal Blood work today If your symptoms worsen or you develop new symptoms please let us know.   Thank you for coming to see [place] at [facility]. I hope we have been able to provide you high quality care today.  You may receive a patient satisfaction survey over the next few weeks. We would appreciate your feedback and comments so that we may continue to improve ourselves and the health of our patients.

## 2018-02-05 LAB — CBC WITH DIFFERENTIAL/PLATELET
BASOS: 0 %
Basophils Absolute: 0 10*3/uL (ref 0.0–0.2)
EOS (ABSOLUTE): 0.1 10*3/uL (ref 0.0–0.4)
Eos: 1 %
Hematocrit: 39.9 % (ref 34.0–46.6)
Hemoglobin: 13.4 g/dL (ref 11.1–15.9)
Immature Grans (Abs): 0 10*3/uL (ref 0.0–0.1)
Immature Granulocytes: 0 %
LYMPHS ABS: 2.2 10*3/uL (ref 0.7–3.1)
Lymphs: 44 %
MCH: 32.1 pg (ref 26.6–33.0)
MCHC: 33.6 g/dL (ref 31.5–35.7)
MCV: 96 fL (ref 79–97)
Monocytes Absolute: 0.4 10*3/uL (ref 0.1–0.9)
Monocytes: 8 %
NEUTROS ABS: 2.4 10*3/uL (ref 1.4–7.0)
Neutrophils: 47 %
Platelets: 235 10*3/uL (ref 150–450)
RBC: 4.18 x10E6/uL (ref 3.77–5.28)
RDW: 11.9 % — AB (ref 12.3–15.4)
WBC: 5.1 10*3/uL (ref 3.4–10.8)

## 2018-02-05 LAB — COMPREHENSIVE METABOLIC PANEL
ALT: 7 IU/L (ref 0–32)
AST: 19 IU/L (ref 0–40)
Albumin/Globulin Ratio: 1.3 (ref 1.2–2.2)
Albumin: 4.1 g/dL (ref 3.5–4.8)
Alkaline Phosphatase: 88 IU/L (ref 39–117)
BUN/Creatinine Ratio: 16 (ref 12–28)
BUN: 14 mg/dL (ref 8–27)
CO2: 24 mmol/L (ref 20–29)
CREATININE: 0.86 mg/dL (ref 0.57–1.00)
Calcium: 10 mg/dL (ref 8.7–10.3)
Chloride: 101 mmol/L (ref 96–106)
GFR calc non Af Amer: 68 mL/min/{1.73_m2} (ref >59)
GFR, EST AFRICAN AMERICAN: 79 mL/min/{1.73_m2} (ref >59)
Globulin, Total: 3.2 g/dL (ref 1.5–4.5)
Glucose: 109 mg/dL — ABNORMAL HIGH (ref 65–99)
POTASSIUM: 4.8 mmol/L (ref 3.5–5.2)
Sodium: 140 mmol/L (ref 134–144)
TOTAL PROTEIN: 7.3 g/dL (ref 6.0–8.5)

## 2018-02-05 LAB — 10-HYDROXYCARBAZEPINE: Oxcarbazepine SerPl-Mcnc: 6 ug/mL — ABNORMAL LOW (ref 10–35)

## 2018-02-06 ENCOUNTER — Telehealth: Payer: Self-pay | Admitting: *Deleted

## 2018-02-06 NOTE — Telephone Encounter (Signed)
Unable to reach patient on home #, no VM. Reached patient on mobile # and informed her that her labs results are relatively unremarkable. SHe verbalized understanding, appreciation.

## 2018-03-05 ENCOUNTER — Ambulatory Visit (HOSPITAL_COMMUNITY)
Admission: EM | Admit: 2018-03-05 | Discharge: 2018-03-05 | Disposition: A | Discharge status: Home / Self Care | Payer: Medicare Other | Attending: Family Medicine | Admitting: Family Medicine

## 2018-03-05 ENCOUNTER — Encounter (HOSPITAL_COMMUNITY): Payer: Self-pay

## 2018-03-05 DIAGNOSIS — M7661 Achilles tendinitis, right leg: Secondary | ICD-10-CM | POA: Diagnosis not present

## 2018-03-05 MED ORDER — ACETAMINOPHEN 500 MG PO TABS: 500.0000 mg | ORAL_TABLET | Freq: Four times a day (QID) | ORAL | 0 refills | Status: DC | PRN

## 2018-03-05 NOTE — ED Provider Notes (Signed)
[place]   [id number] 03/05/18 Arrival Time: 1601  CC: RT ankle pain  SUBJECTIVE: History from: patient. Sabrina Guerrero is a 73 y.o. female complains of right ankle pain that began 2 days ago.  States symptoms began after she changed shoes.  Localizes the pain to the back of ankle.  Describes the pain as intermittent.  Has tried wearing previous shoes without relief.  Symptoms are made worse with standing, and activity.  Denies similar symptoms in the past.  Denies fever, chills, erythema, ecchymosis, effusion, weakness, numbness and tingling.      ROS: As per HPI.  Past Medical History:  Diagnosis Date  . Diabetes mellitus without complication ([place])   . Gastroesophageal reflux disease   . Memory deficit 05/20/2014  . Partial symptomatic epilepsy with complex partial seizures, not intractable, without status epilepticus ([place]) 05/20/2014  . Ventral hernia    Past Surgical History:  Procedure Laterality Date  . CARDIAC CATHETERIZATION    . TUBAL LIGATION    . VENTRAL HERNIA REPAIR     Allergies  Allergen Reactions  . Keppra [Levetiracetam]     Malaise   No current facility-administered medications on file prior to encounter.    Current Outpatient Medications on File Prior to Encounter  Medication Sig Dispense Refill  . omeprazole (PRILOSEC) 20 MG capsule Take 40 mg by mouth 2 (two) times daily before a meal.     . OXcarbazepine (TRILEPTAL) 150 MG tablet Take 1 tablet (150 mg total) by mouth 2 (two) times daily. 180 tablet 3  . psyllium (METAMUCIL) 58.6 % powder Take 1 packet by mouth 2 (two) times daily.     Social History   Socioeconomic History  . Marital status: Divorced    Spouse name: Not on file  . Number of children: 3  . Years of education: 18 years  . Highest education level: Not on file  Occupational History  . Occupation: unemployed  Social Needs  . Financial resource strain: Not on file  . Food insecurity:    Worry: Not on file    Inability:  Not on file  . Transportation needs:    Medical: Not on file    Non-medical: Not on file  Tobacco Use  . Smoking status: Never Smoker  . Smokeless tobacco: Never Used  Substance and Sexual Activity  . Alcohol use: No  . Drug use: No  . Sexual activity: Never  Lifestyle  . Physical activity:    Days per week: Not on file    Minutes per session: Not on file  . Stress: Not on file  Relationships  . Social connections:    Talks on phone: Not on file    Gets together: Not on file    Attends religious service: Not on file    Active member of club or organization: Not on file    Attends meetings of clubs or organizations: Not on file    Relationship status: Not on file  . Intimate partner violence:    Fear of current or ex partner: Not on file    Emotionally abused: Not on file    Physically abused: Not on file    Forced sexual activity: Not on file  Other Topics Concern  . Not on file  Social History Narrative   Patient is right handed.   Patient does not drink caffeine.   Family History  Problem Relation Age of Onset  . Arthritis Mother   . Cancer Brother  lung    OBJECTIVE:  Vitals:   03/05/18 1550  BP: 131/74  Pulse: 74  Resp: 18  Temp: 98.6 F (37 C)  TempSrc: Oral  SpO2: 100%    General appearance: Alert; in no acute distress.  Head: NCAT Lungs: normal respiratory effort CV: posterior tibialis pulse 2+ intact; cap refill <2 seconds of toe Musculoskeletal: RT foot/ ankle Inspection: Skin warm, dry, clear and intact without obvious erythema, effusion, or ecchymosis.  Palpation: TTP over distal aspect of achilles tendon ROM: FROM active and passive about the ankle Strength: 5/5 dorsiflexion, 5/5 plantar flexion Skin: warm and dry Neurologic: Ambulates without difficulty; Sensation intact about the lower extremities Psychological: alert and cooperative; normal mood and affect  ASSESSMENT & PLAN:  1. Achilles tendinitis of right lower extremity      Meds ordered this encounter  Medications  . acetaminophen (TYLENOL) 500 MG tablet    Sig: Take 1 tablet (500 mg total) by mouth every 6 (six) hours as needed.    Dispense:  30 tablet    Refill:  0    Order Specific Question:   Supervising Provider    Answer:   Raylene Everts [[id number]]   Ankle brace given for extra support Continue conservative management of rest, ice, elevation, and gentle stretches Take prescription tylenol as needed for pain relief Follow up with PCP if symptoms persist Return or go to the ER if you have any new or worsening symptoms (fever, chills, chest pain, abdominal pain, changes in bowel or bladder habits, pain radiating into lower legs, etc...)   Reviewed expectations re: course of current medical issues. Questions answered. Outlined signs and symptoms indicating need for more acute intervention. Patient verbalized understanding. After Visit Summary given.    Lestine Box, PA-C 03/05/18 1657

## 2018-03-05 NOTE — Discharge Instructions (Signed)
Ankle brace given for extra support Continue conservative management of rest, ice, elevation, and gentle stretches Take prescription tylenol as needed for pain relief Follow up with PCP if symptoms persist Return or go to the ER if you have any new or worsening symptoms (fever, chills, chest pain, abdominal pain, changes in bowel or bladder habits, pain radiating into lower legs, etc...)

## 2018-03-05 NOTE — ED Triage Notes (Signed)
Pt presents with ankle and foot pain on right side not related to any injury.  Pt believes pain to be from shoes.

## 2018-04-11 ENCOUNTER — Ambulatory Visit (HOSPITAL_COMMUNITY)
Admission: EM | Admit: 2018-04-11 | Discharge: 2018-04-11 | Disposition: A | Discharge status: Home / Self Care | Payer: Medicare Other | Attending: Family Medicine | Admitting: Family Medicine

## 2018-04-11 ENCOUNTER — Encounter (HOSPITAL_COMMUNITY): Payer: Self-pay | Admitting: Emergency Medicine

## 2018-04-11 DIAGNOSIS — M546 Pain in thoracic spine: Secondary | ICD-10-CM | POA: Insufficient documentation

## 2018-04-11 DIAGNOSIS — R1013 Epigastric pain: Secondary | ICD-10-CM | POA: Diagnosis not present

## 2018-04-11 LAB — BASIC METABOLIC PANEL
Anion gap: 5 (ref 5–15)
BUN: 9 mg/dL (ref 8–23)
CALCIUM: 9.5 mg/dL (ref 8.9–10.3)
CO2: 28 mmol/L (ref 22–32)
CREATININE: 0.74 mg/dL (ref 0.44–1.00)
Chloride: 105 mmol/L (ref 98–111)
GFR calc Af Amer: >60 mL/min (ref >60)
GLUCOSE: 103 mg/dL — AB (ref 70–99)
Potassium: 3.7 mmol/L (ref 3.5–5.1)
Sodium: 138 mmol/L (ref 135–145)

## 2018-04-11 NOTE — ED Triage Notes (Signed)
Pt sts mid back pain x 4 days

## 2018-04-11 NOTE — Discharge Instructions (Addendum)
I believe that your pain is either related to gas which can be referred to the back or muscle cramping/spasm.  You can try taking some simethicone over the counter to see if this helps.  The EKG here was normal and I am still waiting on the lab results I will call you with any abnormal results.  If this is muscle spasms or cramps it could be caused by dehydration or low potassium.  Printing out information about gas causing foods and foods high in potassium If the symptoms worsen or do not get better please follow up. If you start having severe back pain, chest pain,abdominal pain, SOB please go to the ER.

## 2018-04-14 NOTE — ED Provider Notes (Signed)
[place]    CSN: [id number] Arrival date & time: 04/11/18  1231     History   Chief Complaint Chief Complaint  Patient presents with  . Back Pain    HPI Sabrina Guerrero is a 73 y.o. female.   Patient is a 73 year old female that presents with upper back pain, muscle spasming.  This is been intermittent over the past 4 days. She has had a few episodes.  She does have a history of trapezius muscle spasms.  Reports that her back muscles lock up and then she is unable to move.  Reports sometimes it happens in the lower back and her muscles lock up and cause her to be unable to walk. She reports that it feels like "gas" in her back.  She does have a hx of GERD. She has not take anything for symptoms.  She denies any chest pain, palpitations or shortness of breath.  ROS per HPI      Past Medical History:  Diagnosis Date  . Diabetes mellitus without complication ([place])   . Gastroesophageal reflux disease   . Memory deficit 05/20/2014  . Partial symptomatic epilepsy with complex partial seizures, not intractable, without status epilepticus ([place]) 05/20/2014  . Ventral hernia     Patient Active Problem List   Diagnosis Date Noted  . Partial symptomatic epilepsy with complex partial seizures, not intractable, without status epilepticus ([place]) 05/20/2014  . Memory deficit 05/20/2014  . Altered mental status 03/11/2013  . Trapezius muscle spasm 12/03/2012  . Generalized anxiety disorder 12/03/2012  . Hx SBO 11/18/2012  . GERD (gastroesophageal reflux disease) 11/18/2012  . DM w/o complication type II ([place]) 11/18/2012  . Left thyroid nodule 11/18/2012  . 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 CARDIAC CATHETERIZATION    . TUBAL LIGATION    . VENTRAL HERNIA REPAIR      OB History    Gravida  3   Para  3   Term  3   Preterm      AB      Living  3     SAB      TAB      Ectopic      Multiple      Live Births               Home Medications    Prior to Admission medications   Medication Sig Start Date End Date Taking? Authorizing Provider  acetaminophen (TYLENOL) 500 MG tablet Take 1 tablet (500 mg total) by mouth every 6 (six) hours as needed. 03/05/18   [place], [place], PA-C  omeprazole (PRILOSEC) 20 MG capsule Take 40 mg by mouth 2 (two) times daily before a meal.     [provider]  OXcarbazepine (TRILEPTAL) 150 MG tablet Take 1 tablet (150 mg total) by mouth 2 (two) times daily. 01/24/17   Kathrynn Ducking, MD  psyllium (METAMUCIL) 58.6 % powder Take 1 packet by mouth 2 (two) times daily.    [provider]    Family History Family History  Problem Relation Age of Onset  . Arthritis Mother   . Cancer Brother        lung    Social History Social History   Tobacco Use  . Smoking status: Never Smoker  . Smokeless tobacco: Never Used  Substance Use Topics  . Alcohol use: No  . Drug use: No     Allergies  Keppra [levetiracetam]   Review of Systems Review of Systems   Physical Exam Triage Vital Signs ED Triage Vitals [04/11/18 1312]  Enc Vitals Group     BP 131/84     Pulse Rate 84     Resp 18     Temp 97.6 F (36.4 C)     Temp Source Temporal     SpO2 100 %     Weight      Height      Head Circumference      Peak Flow      Pain Score 7     Pain Loc      Pain Edu?      Excl. in [place]?    No data found.  Updated Vital Signs BP 131/84 (BP Location: Right Arm)   Pulse 84   Temp 97.6 F (36.4 C) (Temporal)   Resp 18   SpO2 100%   Visual Acuity Right Eye Distance:   Left Eye Distance:   Bilateral Distance:    Right Eye Near:   Left Eye Near:    Bilateral Near:     Physical Exam Vitals signs and nursing note reviewed.  Constitutional:      General: She is not in acute distress.    Appearance: Normal appearance. She is not ill-appearing, toxic-appearing or diaphoretic.  HENT:     Head: Normocephalic and  atraumatic.     Nose: Nose normal.  Eyes:     Conjunctiva/sclera: Conjunctivae normal.  Neck:     Musculoskeletal: Normal range of motion. No muscular tenderness.  Cardiovascular:     Rate and Rhythm: Normal rate and regular rhythm.     Pulses: Normal pulses.     Heart sounds: Normal heart sounds.  Pulmonary:     Effort: Pulmonary effort is normal.     Breath sounds: Normal breath sounds.  Musculoskeletal: Normal range of motion.        General: No swelling, tenderness, deformity or signs of injury.     Comments: Non tender to the entire spine or paravertebral muscles.  No bruising, swelling, erythema or deformities. Without spasms.   Lymphadenopathy:     Cervical: No cervical adenopathy.  Skin:    General: Skin is warm and dry.     Findings: No rash.  Neurological:     Mental Status: She is alert.  Psychiatric:        Mood and Affect: Mood normal.      UC Treatments / Results  Labs (all labs ordered are listed, but only abnormal results are displayed) Labs Reviewed  BASIC METABOLIC PANEL - Abnormal; Notable for the following components:      Result Value   Glucose, Bld 103 (*)    All other components within normal limits    EKG None  Radiology No results found.  Procedures Procedures (including critical care time)  Medications Ordered in UC Medications - No data to display  Initial Impression / Assessment and Plan / UC Course  I have reviewed the triage vital signs and the nursing notes.  Pertinent labs & imaging results that were available during my care of the patient were reviewed by me and considered in my medical decision making (see chart for details).     Most likely patient symptoms are coming from gas that is being referred into the back versus muscle spasming, cramping. We will have her try simethicone over-the-counter to see if this helps with symptoms. Instructed that muscle cramping could be caused  from low potassium, magnesium or mild  dehydration Blood work here was normal without low potassium or dehydration.  The EKG here was normal Strict precautions that if her symptoms continue to worsen she will need to follow-up in the ER  Final Clinical Impressions(s) / UC Diagnoses   Final diagnoses:  Acute thoracic back pain, unspecified back pain laterality     Discharge Instructions     I believe that your pain is either related to gas which can be referred to the back or muscle cramping/spasm.  You can try taking some simethicone over the counter to see if this helps.  The EKG here was normal and I am still waiting on the lab results I will call you with any abnormal results.  If this is muscle spasms or cramps it could be caused by dehydration or low potassium.  Printing out information about gas causing foods and foods high in potassium If the symptoms worsen or do not get better please follow up. If you start having severe back pain, chest pain,abdominal pain, SOB please go to the ER.      ED Prescriptions    None     Controlled Substance Prescriptions [place] Controlled Substance Registry consulted? Not Applicable   Orvan July, NP 04/14/18 [phone]

## 2018-05-27 ENCOUNTER — Telehealth: Payer: Self-pay | Admitting: Neurology

## 2018-05-27 MED ORDER — OXCARBAZEPINE 150 MG PO TABS: 150.0000 mg | ORAL_TABLET | Freq: Two times a day (BID) | ORAL | 3 refills | Status: DC

## 2018-05-27 NOTE — Telephone Encounter (Signed)
Chart reviewed a refill is appropriate. Rx has been sent to [facility] and pt has been notified.

## 2018-05-27 NOTE — Telephone Encounter (Signed)
Pt called in and wanted to know if meds can be sent to [place], [place]

## 2018-05-27 NOTE — Telephone Encounter (Signed)
Pt is needing a refill on her OXcarbazepine (TRILEPTAL) 150 MG tablet sent to [facility] [address]

## 2018-05-27 NOTE — Telephone Encounter (Signed)
Rx resubmitted to [facility].

## 2018-05-27 NOTE — Addendum Note (Signed)
Addended by: Verlin Grills T on: 05/27/2018 10:23 AM   Modules accepted: Orders

## 2018-06-20 ENCOUNTER — Telehealth: Payer: Self-pay | Admitting: Adult Health

## 2018-06-20 NOTE — Telephone Encounter (Signed)
Pt is needing a refill on her OXcarbazepine (TRILEPTAL) 150 MG tablet sent to [facility] on [address]  Pt states she only has 5 left and she has not receive it from the [place]. Please advise.

## 2018-06-23 MED ORDER — OXCARBAZEPINE 150 MG PO TABS: 150.0000 mg | ORAL_TABLET | Freq: Two times a day (BID) | ORAL | 0 refills | Status: DC

## 2018-06-23 NOTE — Telephone Encounter (Signed)
Done

## 2018-08-04 ENCOUNTER — Telehealth: Payer: Self-pay | Admitting: *Deleted

## 2018-08-04 NOTE — Telephone Encounter (Signed)
I called and spoke to [facility] and [place] about pts appt. Due to current COVID 19 pandemic, our office is severely reducing in office visits until further notice, in order to minimize the risk to our patients and healthcare providers. Not able to do VV at this time (no one able to be there with her to assist).  Ok to defer to later time 08-26-18 at 1345.  She will have mother call us back to confirm appt change (to 08-26-18 at 1345).

## 2018-08-05 ENCOUNTER — Ambulatory Visit: Payer: Medicare Other | Admitting: Neurology

## 2018-08-15 DIAGNOSIS — Z8719 Personal history of other diseases of the digestive system: Secondary | ICD-10-CM | POA: Diagnosis not present

## 2018-08-15 DIAGNOSIS — E041 Nontoxic single thyroid nodule: Secondary | ICD-10-CM | POA: Diagnosis not present

## 2018-08-15 DIAGNOSIS — K219 Gastro-esophageal reflux disease without esophagitis: Secondary | ICD-10-CM | POA: Diagnosis not present

## 2018-08-15 DIAGNOSIS — F441 Dissociative fugue: Secondary | ICD-10-CM | POA: Diagnosis not present

## 2018-08-15 DIAGNOSIS — Z9889 Other specified postprocedural states: Secondary | ICD-10-CM | POA: Diagnosis not present

## 2018-08-15 DIAGNOSIS — G40009 Localization-related (focal) (partial) idiopathic epilepsy and epileptic syndromes with seizures of localized onset, not intractable, without status epilepticus: Secondary | ICD-10-CM | POA: Diagnosis not present

## 2018-08-15 DIAGNOSIS — E119 Type 2 diabetes mellitus without complications: Secondary | ICD-10-CM | POA: Diagnosis not present

## 2018-08-25 NOTE — Progress Notes (Signed)
PATIENT: Sabrina Guerrero DOB: 13-Jan-1946  REASON FOR VISIT: follow up HISTORY FROM: patient  HISTORY OF PRESENT ILLNESS: Today 08/26/18  Ms. Mericle is a 73 year old female with history of seizure type events.  She remains on Trileptal 150 mg twice daily and is tolerating the medication well. She denies any recurrent seizure events.  She reports her last seizure was in 2014.  She lives alone, however grandchildren stay with her.  She drives a car independently.  She is able to perform all of her own ADLs.  She says her overall health has been good since her last visit.  She denies any difficulty walking or any recent falls.  She presents today for follow-up accompanied by her daughter.  HISTORY 02/03/2018 MM: Ms. Veno is a 73 year old female with a history of seizure type events.  She returns today for follow-up.  She is currently on Trileptal 150 mg twice a day.  She denies any seizure events.  She is tolerating the medication well.  She lives at home alone.  She is able to complete all ADLs independently.  No changes in her gait or balance.  She returns today for evaluation.  REVIEW OF SYSTEMS: Out of a complete 14 system review of symptoms, the patient complains only of the following symptoms, and all other reviewed systems are negative.  Seizures  ALLERGIES: Allergies  Allergen Reactions  . Keppra [Levetiracetam]     Malaise    HOME MEDICATIONS: Outpatient Medications Prior to Visit  Medication Sig Dispense Refill  . acetaminophen (TYLENOL) 500 MG tablet Take 1 tablet (500 mg total) by mouth every 6 (six) hours as needed. 30 tablet 0  . omeprazole (PRILOSEC) 20 MG capsule Take 40 mg by mouth 2 (two) times daily before a meal.     . psyllium (METAMUCIL) 58.6 % powder Take 1 packet by mouth 2 (two) times daily.    . OXcarbazepine (TRILEPTAL) 150 MG tablet Take 1 tablet (150 mg total) by mouth 2 (two) times daily. 180 tablet 0   No facility-administered medications prior to  visit.     PAST MEDICAL HISTORY: Past Medical History:  Diagnosis Date  . Diabetes mellitus without complication ([employer])   . Gastroesophageal reflux disease   . Memory deficit 05/20/2014  . Partial symptomatic epilepsy with complex partial seizures, not intractable, without status epilepticus ([place]) 05/20/2014  . Ventral hernia     PAST SURGICAL HISTORY: Past Surgical History:  Procedure Laterality Date  . CARDIAC CATHETERIZATION    . TUBAL LIGATION    . VENTRAL HERNIA REPAIR      FAMILY HISTORY: Family History  Problem Relation Age of Onset  . Arthritis Mother   . Cancer Brother        lung    SOCIAL HISTORY: Social History   Socioeconomic History  . Marital status: Divorced    Spouse name: Not on file  . Number of children: 3  . Years of education: 18 years  . Highest education level: Not on file  Occupational History  . Occupation: unemployed  Social Needs  . Financial resource strain: Not on file  . Food insecurity    Worry: Not on file    Inability: Not on file  . Transportation needs    Medical: Not on file    Non-medical: Not on file  Tobacco Use  . Smoking status: Never Smoker  . Smokeless tobacco: Never Used  Substance and Sexual Activity  . Alcohol use: No  . Drug use:  No  . Sexual activity: Never  Lifestyle  . Physical activity    Days per week: Not on file    Minutes per session: Not on file  . Stress: Not on file  Relationships  . Social Herbalist on phone: Not on file    Gets together: Not on file    Attends religious service: Not on file    Active member of club or organization: Not on file    Attends meetings of clubs or organizations: Not on file    Relationship status: Not on file  . Intimate partner violence    Fear of current or ex partner: Not on file    Emotionally abused: Not on file    Physically abused: Not on file    Forced sexual activity: Not on file  Other Topics Concern  . Not on file  Social History  Narrative   Patient is right handed.   Patient does not drink caffeine.    PHYSICAL EXAM  Vitals:   08/26/18 1331  BP: 102/70  Pulse: 84  Temp: 98.2 F (36.8 C)  Weight: 159 lb 9.6 oz (72.4 kg)  Height: 5\' 2"  (1.575 m)   Body mass index is 29.19 kg/m.  Generalized: Well developed, in no acute distress   Neurological examination  Mentation: Alert oriented to time, place, history taking. Follows all commands speech and language fluent Cranial nerve II-XII: Pupils were equal round reactive to light. Extraocular movements were full, visual field were full on confrontational test. Facial sensation and strength were normal. Uvula tongue midline. Head turning and shoulder shrug  were normal and symmetric. Motor: The motor testing reveals 5 over 5 strength of all 4 extremities. Good symmetric motor tone is noted throughout.  Sensory: Sensory testing is intact to soft touch on all 4 extremities. No evidence of extinction is noted.  Coordination: Cerebellar testing reveals good finger-nose-finger and heel-to-shin bilaterally.  Gait and station: Gait is normal. Tandem gait is unsteady. Romberg is negative. No drift is seen.  Reflexes: Deep tendon reflexes are symmetric and normal bilaterally.   DIAGNOSTIC DATA (LABS, IMAGING, TESTING) - I reviewed patient records, labs, notes, testing and imaging myself where available.  Lab Results  Component Value Date   WBC 5.1 02/03/2018   HGB 13.4 02/03/2018   HCT 39.9 02/03/2018   MCV 96 02/03/2018   PLT 235 02/03/2018      Component Value Date/Time   NA 138 04/11/2018 1346   NA 140 02/03/2018 1424   K 3.7 04/11/2018 1346   CL 105 04/11/2018 1346   CO2 28 04/11/2018 1346   GLUCOSE 103 (H) 04/11/2018 1346   BUN 9 04/11/2018 1346   BUN 14 02/03/2018 1424   CREATININE 0.74 04/11/2018 1346   CALCIUM 9.5 04/11/2018 1346   PROT 7.3 02/03/2018 1424   ALBUMIN 4.1 02/03/2018 1424   AST 19 02/03/2018 1424   ALT 7 02/03/2018 1424   ALKPHOS 88  02/03/2018 1424   BILITOT <0.2 02/03/2018 1424   GFRNONAA >60 04/11/2018 1346   GFRAA >60 04/11/2018 1346   Lab Results  Component Value Date   CHOL 233 (H) 11/18/2012   HDL 41.10 11/18/2012   LDLDIRECT 178.0 11/18/2012   TRIG 62.0 11/18/2012   CHOLHDL 6 11/18/2012   Lab Results  Component Value Date   HGBA1C 6.5 11/18/2012   Lab Results  Component Value Date   VITAMINB12 510 11/18/2012   Lab Results  Component Value Date   TSH  0.65 11/18/2012    ASSESSMENT AND PLAN 73 y.o. year old female  has a past medical history of Diabetes mellitus without complication ([place]), Gastroesophageal reflux disease, Memory deficit (05/20/2014), Partial symptomatic epilepsy with complex partial seizures, not intractable, without status epilepticus ([place]) (05/20/2014), and Ventral hernia. here with:  1.  Seizures  She is doing very well, she has not had recurrent seizure.  She will continue taking Trileptal 150 mg twice daily.  I will check lab work today.  She will follow-up in 6 months or sooner if needed.  I advised that if her symptoms worsen or she develops any new symptoms she should let us know.  I spent 15 minutes with the patient. 50% of this time was spent discussing her plan of care.   Butler Denmark, AGNP-C, DNP 08/26/2018, 2:05 PM [facility] [address] [place], [place] [address] [phone]

## 2018-08-26 ENCOUNTER — Ambulatory Visit (INDEPENDENT_AMBULATORY_CARE_PROVIDER_SITE_OTHER): Payer: Medicare Other | Admitting: Neurology

## 2018-08-26 ENCOUNTER — Other Ambulatory Visit: Payer: Self-pay

## 2018-08-26 ENCOUNTER — Encounter: Payer: Self-pay | Admitting: Neurology

## 2018-08-26 VITALS — BP 102/70 | HR 84 | Temp 98.2°F | Ht 62.0 in | Wt 159.6 lb

## 2018-08-26 DIAGNOSIS — G40209 Localization-related (focal) (partial) symptomatic epilepsy and epileptic syndromes with complex partial seizures, not intractable, without status epilepticus: Secondary | ICD-10-CM | POA: Diagnosis not present

## 2018-08-26 MED ORDER — OXCARBAZEPINE 150 MG PO TABS: 150.0000 mg | ORAL_TABLET | Freq: Two times a day (BID) | ORAL | 1 refills | Status: DC

## 2018-08-26 NOTE — Progress Notes (Signed)
I have read the note, and I agree with the clinical assessment and plan.  Shaylan Tutton K Ankith [place]   

## 2018-08-28 ENCOUNTER — Telehealth: Payer: Self-pay | Admitting: *Deleted

## 2018-08-28 LAB — COMPREHENSIVE METABOLIC PANEL
ALT: 9 IU/L (ref 0–32)
AST: 19 IU/L (ref 0–40)
Albumin/Globulin Ratio: 1.4 (ref 1.2–2.2)
Albumin: 4.2 g/dL (ref 3.7–4.7)
Alkaline Phosphatase: 88 IU/L (ref 39–117)
BUN/Creatinine Ratio: 18 (ref 12–28)
BUN: 14 mg/dL (ref 8–27)
Bilirubin Total: 0.3 mg/dL (ref 0.0–1.2)
CO2: 20 mmol/L (ref 20–29)
Calcium: 9.5 mg/dL (ref 8.7–10.3)
Chloride: 96 mmol/L (ref 96–106)
Creatinine, Ser: 0.8 mg/dL (ref 0.57–1.00)
GFR calc Af Amer: 85 mL/min/{1.73_m2} (ref >59)
GFR calc non Af Amer: 74 mL/min/{1.73_m2} (ref >59)
Globulin, Total: 3.1 g/dL (ref 1.5–4.5)
Glucose: 121 mg/dL — ABNORMAL HIGH (ref 65–99)
Potassium: 3.8 mmol/L (ref 3.5–5.2)
Sodium: 135 mmol/L (ref 134–144)
Total Protein: 7.3 g/dL (ref 6.0–8.5)

## 2018-08-28 LAB — CBC WITH DIFFERENTIAL/PLATELET
Basophils Absolute: 0 10*3/uL (ref 0.0–0.2)
Basos: 0 %
EOS (ABSOLUTE): 0 10*3/uL (ref 0.0–0.4)
Eos: 1 %
Hematocrit: 35.5 % (ref 34.0–46.6)
Hemoglobin: 12.5 g/dL (ref 11.1–15.9)
Immature Grans (Abs): 0 10*3/uL (ref 0.0–0.1)
Immature Granulocytes: 0 %
Lymphocytes Absolute: 2.1 10*3/uL (ref 0.7–3.1)
Lymphs: 38 %
MCH: 32.8 pg (ref 26.6–33.0)
MCHC: 35.2 g/dL (ref 31.5–35.7)
MCV: 93 fL (ref 79–97)
Monocytes Absolute: 0.4 10*3/uL (ref 0.1–0.9)
Monocytes: 7 %
Neutrophils Absolute: 3 10*3/uL (ref 1.4–7.0)
Neutrophils: 54 %
Platelets: 183 10*3/uL (ref 150–450)
RBC: 3.81 x10E6/uL (ref 3.77–5.28)
RDW: 11.9 % (ref 11.7–15.4)
WBC: 5.6 10*3/uL (ref 3.4–10.8)

## 2018-08-28 LAB — 10-HYDROXYCARBAZEPINE: Oxcarbazepine SerPl-Mcnc: 7 ug/mL — ABNORMAL LOW (ref 10–35)

## 2018-08-28 NOTE — Telephone Encounter (Signed)
Spoke with daughter, Andee Poles and informed her mother's labs are stable. Her glucose was elevated at 121 which could be in relation to her meal before labs. Her  Trileptal level is stable low. The NP stated there is no change in dosing. Danielle verbalized understanding, appreciation.

## 2018-10-07 ENCOUNTER — Other Ambulatory Visit: Payer: Self-pay | Admitting: Adult Health

## 2019-02-02 ENCOUNTER — Ambulatory Visit: Payer: Medicare Other | Attending: Internal Medicine

## 2019-02-02 DIAGNOSIS — Z20828 Contact with and (suspected) exposure to other viral communicable diseases: Secondary | ICD-10-CM | POA: Diagnosis not present

## 2019-03-30 ENCOUNTER — Telehealth: Payer: Self-pay | Admitting: Neurology

## 2019-03-30 NOTE — Telephone Encounter (Signed)
I LMVM for pt to return call (this with family member).

## 2019-03-30 NOTE — Telephone Encounter (Signed)
Pt came by requesting a medication refill and a letter stating how often she is seen here. Please advise.

## 2019-03-31 MED ORDER — OXCARBAZEPINE 150 MG PO TABS: 150.0000 mg | ORAL_TABLET | Freq: Two times a day (BID) | ORAL | 0 refills | Status: DC

## 2019-03-31 NOTE — Telephone Encounter (Signed)
I called pt and she stated that she is needing refill for her trileptal 150mg  po bid for [facility].  I filled for 30 day.  I made appt for her tomorrow at 1345 for evaluation seizures (6 month).  She states that she [facility] Corrin Parker [phone] stating how often seen for her seizures.  Will address tomorrow at appt.

## 2019-04-01 ENCOUNTER — Encounter: Payer: Self-pay | Admitting: Neurology

## 2019-04-01 ENCOUNTER — Ambulatory Visit (INDEPENDENT_AMBULATORY_CARE_PROVIDER_SITE_OTHER): Payer: Medicare Other | Admitting: Neurology

## 2019-04-01 ENCOUNTER — Other Ambulatory Visit: Payer: Self-pay

## 2019-04-01 VITALS — BP 120/82 | HR 86 | Temp 97.5°F | Ht 62.0 in | Wt 167.6 lb

## 2019-04-01 DIAGNOSIS — G40209 Localization-related (focal) (partial) symptomatic epilepsy and epileptic syndromes with complex partial seizures, not intractable, without status epilepticus: Secondary | ICD-10-CM | POA: Diagnosis not present

## 2019-04-01 MED ORDER — OXCARBAZEPINE 150 MG PO TABS: 150.0000 mg | ORAL_TABLET | Freq: Two times a day (BID) | ORAL | 3 refills | Status: DC

## 2019-04-01 NOTE — Patient Instructions (Signed)
It was great to see you today! I am glad you are doing well! Continue current medication I will check blood work today Call for seizure, see you in 1 year :)

## 2019-04-01 NOTE — Progress Notes (Signed)
PATIENT: Sabrina Guerrero DOB: 1945-08-28  REASON FOR VISIT: follow up HISTORY FROM: patient  HISTORY OF PRESENT ILLNESS: Today 04/01/19  Sabrina Guerrero is a 74 year old female with history of seizure type events.  She remains on Trileptal 150 mg twice daily.  Her last seizure was in 2014.  She is tolerating the medication without side effect.  She has not had recurrent seizure.  Her grandchildren live with her.  She drives a car without difficulty and performs all of her own ADLs.  She says her overall health has been good.  She presents today for evaluation unaccompanied.  HISTORY 08/26/2018 SS: Sabrina Guerrero is a 74 year old female with history of seizure type events.  She remains on Trileptal 150 mg twice daily and is tolerating the medication well. She denies any recurrent seizure events.  She reports her last seizure was in 2014.  She lives alone, however grandchildren stay with her.  She drives a car independently.  She is able to perform all of her own ADLs.  She says her overall health has been good since her last visit.  She denies any difficulty walking or any recent falls.  She presents today for follow-up accompanied by her daughter   REVIEW OF SYSTEMS: Out of a complete 14 system review of symptoms, the patient complains only of the following symptoms, and all other reviewed systems are negative.  Seizures   ALLERGIES: Allergies  Allergen Reactions  . Keppra [Levetiracetam]     Malaise    HOME MEDICATIONS: Outpatient Medications Prior to Visit  Medication Sig Dispense Refill  . acetaminophen (TYLENOL) 500 MG tablet Take 1 tablet (500 mg total) by mouth every 6 (six) hours as needed. 30 tablet 0  . omeprazole (PRILOSEC) 20 MG capsule Take 40 mg by mouth 2 (two) times daily before a meal.     . psyllium (METAMUCIL) 58.6 % powder Take 1 packet by mouth 2 (two) times daily.    . OXcarbazepine (TRILEPTAL) 150 MG tablet Take 1 tablet (150 mg total) by mouth 2 (two) times daily. 60  tablet 0   No facility-administered medications prior to visit.    PAST MEDICAL HISTORY: Past Medical History:  Diagnosis Date  . Diabetes mellitus without complication ([place])   . Gastroesophageal reflux disease   . Memory deficit 05/20/2014  . Partial symptomatic epilepsy with complex partial seizures, not intractable, without status epilepticus ([place]) 05/20/2014  . Ventral hernia     PAST SURGICAL HISTORY: Past Surgical History:  Procedure Laterality Date  . CARDIAC CATHETERIZATION    . TUBAL LIGATION    . VENTRAL HERNIA REPAIR      FAMILY HISTORY: Family History  Problem Relation Age of Onset  . Arthritis Mother   . Cancer Brother        lung    SOCIAL HISTORY: Social History   Socioeconomic History  . Marital status: Divorced    Spouse name: Not on file  . Number of children: 3  . Years of education: 18 years  . Highest education level: Not on file  Occupational History  . Occupation: unemployed  Tobacco Use  . Smoking status: Never Smoker  . Smokeless tobacco: Never Used  Substance and Sexual Activity  . Alcohol use: No  . Drug use: No  . Sexual activity: Never  Other Topics Concern  . Not on file  Social History Narrative   Patient is right handed.   Patient does not drink caffeine.   Social Determinants of Health  Financial Resource Strain:   . Difficulty of Paying Living Expenses: Not on file  Food Insecurity:   . Worried About Charity fundraiser in the Last Year: Not on file  . Ran Out of Food in the Last Year: Not on file  Transportation Needs:   . Lack of Transportation (Medical): Not on file  . Lack of Transportation (Non-Medical): Not on file  Physical Activity:   . Days of Exercise per Week: Not on file  . Minutes of Exercise per Session: Not on file  Stress:   . Feeling of Stress : Not on file  Social Connections:   . Frequency of Communication with Friends and Family: Not on file  . Frequency of Social Gatherings with Friends and  Family: Not on file  . Attends Religious Services: Not on file  . Active Member of Clubs or Organizations: Not on file  . Attends Archivist Meetings: Not on file  . Marital Status: Not on file  Intimate Partner Violence:   . Fear of Current or Ex-Partner: Not on file  . Emotionally Abused: Not on file  . Physically Abused: Not on file  . Sexually Abused: Not on file   PHYSICAL EXAM  Vitals:   04/01/19 1335  BP: 120/82  Pulse: 86  Temp: (!) 97.5 F (36.4 C)  TempSrc: Oral  Weight: 167 lb 9.6 oz (76 kg)  Height: 5\' 2"  (1.575 m)   Body mass index is 30.65 kg/m.  Generalized: Well developed, in no acute distress   Neurological examination  Mentation: Alert oriented to time, place, history taking. Follows all commands speech and language fluent Cranial nerve II-XII: Pupils were equal round reactive to light. Extraocular movements were full, visual field were full on confrontational test. Facial sensation and strength were normal. Head turning and shoulder shrug were normal and symmetric. Motor: The motor testing reveals 5 over 5 strength of all 4 extremities. Good symmetric motor tone is noted throughout.  Sensory: Sensory testing is intact to soft touch on all 4 extremities. No evidence of extinction is noted.  Coordination: Cerebellar testing reveals good finger-nose-finger and heel-to-shin bilaterally.  Gait and station: Gait is normal, no assistive device.  Tandem gait is mildly unsteady. Romberg is negative. No drift is seen.  Reflexes: Deep tendon reflexes are symmetric  DIAGNOSTIC DATA (LABS, IMAGING, TESTING) - I reviewed patient records, labs, notes, testing and imaging myself where available.  Lab Results  Component Value Date   WBC 5.6 08/26/2018   HGB 12.5 08/26/2018   HCT 35.5 08/26/2018   MCV 93 08/26/2018   PLT 183 08/26/2018      Component Value Date/Time   NA 135 08/26/2018 1415   K 3.8 08/26/2018 1415   CL 96 08/26/2018 1415   CO2 20  08/26/2018 1415   GLUCOSE 121 (H) 08/26/2018 1415   GLUCOSE 103 (H) 04/11/2018 1346   BUN 14 08/26/2018 1415   CREATININE 0.80 08/26/2018 1415   CALCIUM 9.5 08/26/2018 1415   PROT 7.3 08/26/2018 1415   ALBUMIN 4.2 08/26/2018 1415   AST 19 08/26/2018 1415   ALT 9 08/26/2018 1415   ALKPHOS 88 08/26/2018 1415   BILITOT 0.3 08/26/2018 1415   GFRNONAA 74 08/26/2018 1415   GFRAA 85 08/26/2018 1415   Lab Results  Component Value Date   CHOL 233 (H) 11/18/2012   HDL 41.10 11/18/2012   LDLDIRECT 178.0 11/18/2012   TRIG 62.0 11/18/2012   CHOLHDL 6 11/18/2012   Lab Results  Component Value Date   HGBA1C 6.5 11/18/2012   Lab Results  Component Value Date   VITAMINB12 510 11/18/2012   Lab Results  Component Value Date   TSH 0.65 11/18/2012   ASSESSMENT AND PLAN 74 y.o. year old female  has a past medical history of Diabetes mellitus without complication ([place]), Gastroesophageal reflux disease, Memory deficit (05/20/2014), Partial symptomatic epilepsy with complex partial seizures, not intractable, without status epilepticus ([place]) (05/20/2014), and Ventral hernia. here with:  1.  Seizures, well controlled  She continues to do quite well.  She has not had recurrent seizure.  She will remain on Trileptal 150 mg twice a day.  I will check blood work today.  She will call for recurrent seizure, otherwise follow-up in 1 year or sooner if needed.  I spent 15 minutes with the patient. 50% of this time was spent discussing her plan of care.   Butler Denmark, AGNP-C, DNP 04/01/2019, 2:04 PM [facility] [address] [place],  [address] [phone]

## 2019-04-03 NOTE — Progress Notes (Signed)
I have read the note, and I agree with the clinical assessment and plan.  Sabrina Guerrero [name]   

## 2019-04-08 LAB — CBC WITH DIFFERENTIAL/PLATELET
Basophils Absolute: 0 10*3/uL (ref 0.0–0.2)
Basos: 0 %
EOS (ABSOLUTE): 0.1 10*3/uL (ref 0.0–0.4)
Eos: 2 %
Hematocrit: 38.3 % (ref 34.0–46.6)
Hemoglobin: 12.7 g/dL (ref 11.1–15.9)
Immature Grans (Abs): 0 10*3/uL (ref 0.0–0.1)
Immature Granulocytes: 0 %
Lymphocytes Absolute: 2 10*3/uL (ref 0.7–3.1)
Lymphs: 41 %
MCH: 31.1 pg (ref 26.6–33.0)
MCHC: 33.2 g/dL (ref 31.5–35.7)
MCV: 94 fL (ref 79–97)
Monocytes Absolute: 0.4 10*3/uL (ref 0.1–0.9)
Monocytes: 9 %
Neutrophils Absolute: 2.2 10*3/uL (ref 1.4–7.0)
Neutrophils: 48 %
Platelets: 213 10*3/uL (ref 150–450)
RBC: 4.09 x10E6/uL (ref 3.77–5.28)
RDW: 11.9 % (ref 11.7–15.4)
WBC: 4.8 10*3/uL (ref 3.4–10.8)

## 2019-04-08 LAB — COMPREHENSIVE METABOLIC PANEL
ALT: 8 IU/L (ref 0–32)
AST: 16 IU/L (ref 0–40)
Albumin/Globulin Ratio: 1.1 — ABNORMAL LOW (ref 1.2–2.2)
Albumin: 3.8 g/dL (ref 3.7–4.7)
Alkaline Phosphatase: 91 IU/L (ref 39–117)
BUN/Creatinine Ratio: 12 (ref 12–28)
BUN: 10 mg/dL (ref 8–27)
Bilirubin Total: <0.2 mg/dL (ref 0.0–1.2)
CO2: 25 mmol/L (ref 20–29)
Calcium: 9.3 mg/dL (ref 8.7–10.3)
Chloride: 99 mmol/L (ref 96–106)
Creatinine, Ser: 0.83 mg/dL (ref 0.57–1.00)
GFR calc Af Amer: 81 mL/min/{1.73_m2} (ref >59)
GFR calc non Af Amer: 70 mL/min/{1.73_m2} (ref >59)
Globulin, Total: 3.5 g/dL (ref 1.5–4.5)
Glucose: 150 mg/dL — ABNORMAL HIGH (ref 65–99)
Potassium: 4.4 mmol/L (ref 3.5–5.2)
Sodium: 138 mmol/L (ref 134–144)
Total Protein: 7.3 g/dL (ref 6.0–8.5)

## 2019-04-08 LAB — 10-HYDROXYCARBAZEPINE: Oxcarbazepine SerPl-Mcnc: 6 ug/mL — ABNORMAL LOW (ref 10–35)

## 2019-04-09 ENCOUNTER — Telehealth: Payer: Self-pay

## 2019-04-09 NOTE — Telephone Encounter (Signed)
Spoke with the patient and she verbalized understanding her results. No questions or concerns at this time.   

## 2019-04-09 NOTE — Telephone Encounter (Signed)
-----   Message from Suzzanne Cloud, NP sent at 04/08/2019  6:05 AM EST ----- Please call patient, labs show random glucose level elevated 150, otherwise unremarkable. Continue medications at current dosing.

## 2019-05-19 ENCOUNTER — Encounter: Payer: Self-pay | Admitting: Internal Medicine

## 2019-06-18 ENCOUNTER — Ambulatory Visit (INDEPENDENT_AMBULATORY_CARE_PROVIDER_SITE_OTHER): Payer: Medicare Other | Admitting: Internal Medicine

## 2019-06-18 ENCOUNTER — Encounter: Payer: Self-pay | Admitting: Internal Medicine

## 2019-06-18 VITALS — BP 110/66 | HR 76 | Temp 96.7°F | Ht 62.0 in | Wt 160.2 lb

## 2019-06-18 DIAGNOSIS — Z1211 Encounter for screening for malignant neoplasm of colon: Secondary | ICD-10-CM | POA: Diagnosis not present

## 2019-06-18 DIAGNOSIS — R143 Flatulence: Secondary | ICD-10-CM

## 2019-06-18 DIAGNOSIS — R1310 Dysphagia, unspecified: Secondary | ICD-10-CM | POA: Diagnosis not present

## 2019-06-18 DIAGNOSIS — R1319 Other dysphagia: Secondary | ICD-10-CM

## 2019-06-18 DIAGNOSIS — R1010 Upper abdominal pain, unspecified: Secondary | ICD-10-CM

## 2019-06-18 DIAGNOSIS — K219 Gastro-esophageal reflux disease without esophagitis: Secondary | ICD-10-CM | POA: Diagnosis not present

## 2019-06-18 MED ORDER — NA SULFATE-K SULFATE-MG SULF 17.5-3.13-1.6 GM/177ML PO SOLN: 1.0000 | Freq: Once | ORAL | 0 refills | Status: AC

## 2019-06-18 NOTE — Progress Notes (Signed)
HISTORY OF PRESENT ILLNESS:  Sabrina Guerrero is a 74 y.o. female, [employer] veteran and mother 3, who is self-referred today regarding "gas pain".  The patient has a history of GERD for which she takes omeprazole.  She denies having had any prior GI evaluations or GI procedures.  She tells me that about 4 to 6 weeks ago she developed problems with "gas".  Described fullness or pain in her upper abdomen and back, particularly after meals.  Would bother her at night.  Was seen at the [facility] hospital and taken off her PPI for stool test (presumably Helicobacter pylori testing).  She states that her symptoms worsen during that timeframe.  As well she developed what sounds like mild esophageal dysphagia and severe heartburn.  She is now back on omeprazole 40 mg twice daily.  Those symptoms resolved.  Her initial problems with upper pain radiating to the back have resolved.  She has not had screening colonoscopy.  She has not been vaccinated to Covid.  She does not smoke or use alcohol.  Review of outside blood work from April 01, 2019 shows unremarkable comprehensive metabolic panel.  Normal liver tests.  Normal CBC with hemoglobin 12.7.  No relevant x-rays to report  REVIEW OF SYSTEMS:  All non-GI ROS negative unless otherwise stated in the HPI.  Past Medical History:  Diagnosis Date  . Diabetes mellitus without complication ([place])   . Gastroesophageal reflux disease   . Memory deficit 05/20/2014  . Partial symptomatic epilepsy with complex partial seizures, not intractable, without status epilepticus ([place]) 05/20/2014  . Ventral hernia     Past Surgical History:  Procedure Laterality Date  . TUBAL LIGATION    . VENTRAL HERNIA REPAIR      Social History Sabrina Guerrero  reports that she has never smoked. She has never used smokeless tobacco. She reports that she does not drink alcohol or use drugs.  family history includes Arthritis in her mother; Cancer in her brother.  Allergies  Allergen Reactions   . Keppra [Levetiracetam]     Malaise       PHYSICAL EXAMINATION: Vital signs: BP 110/66   Pulse 76   Temp (!) 96.7 F (35.9 C)   Ht 5\' 2"  (1.575 m)   Wt 160 lb 3.2 oz (72.7 kg)   BMI 29.30 kg/m   Constitutional: generally well-appearing, no acute distress Psychiatric: alert and oriented x3, cooperative Eyes: extraocular movements intact, anicteric, conjunctiva pink Mouth: oral pharynx moist, no lesions Neck: supple no lymphadenopathy Cardiovascular: heart regular rate and rhythm, no murmur Lungs: clear to auscultation bilaterally Abdomen: soft, nontender, nondistended, no obvious ascites, no peritoneal signs, normal bowel sounds, no organomegaly.  Prior surgical incision well-healed Rectal: Deferred to colonoscopy Extremities: no clubbing, cyanosis, or lower extremity edema bilaterally Skin: no lesions on visible extremities Neuro: No focal deficits.  Cranial nerves intact  ASSESSMENT:  1.  Upper abdominal pain with radiation to the back.  Rule out gallstones.  Rule out pancreatitis.  Rule out gastric ulcer.  Rule out worsening GERD. 2.  Colon cancer screening.  Overdue.  Interested 3.  GERD.  Classic symptoms controlled with PPI 4.  Possible esophageal dysphagia   PLAN:  1.  Upper endoscopy to evaluate upper abdominal pain and possible dysphagia in a patient with longstanding GERD.The nature of the procedure, as well as the risks, benefits, and alternatives were carefully and thoroughly reviewed with the patient. Ample time for discussion and questions allowed. The patient understood, was satisfied, and  agreed to proceed. 2.  Colonoscopy to provide colon cancer screening evaluate abdominal discomfort.The nature of the procedure, as well as the risks, benefits, and alternatives were carefully and thoroughly reviewed with the patient. Ample time for discussion and questions allowed. The patient understood, was satisfied, and agreed to proceed. 3.  Reflux precautions 4.   Continue PPI 5.  Schedule abdominal ultrasound to evaluate abdominal pain.  Rule out gallstones 6.  Additional plans to be determined based on the above A total time of 60 minutes was required preparing to see the patient, reviewing outside test, obtaining comprehensive history and performing comprehensive physical exam.  Also, counseling educating the patient regarding her above listed issues, ordering advanced procedures and advanced imaging studies.  Finally documenting clinical information in the health record

## 2019-06-18 NOTE — Patient Instructions (Addendum)
You have been scheduled for an abdominal ultrasound at [facility] (1st floor of hospital) on Tuesday 06/23/19 at 8:00 am. Please arrive 15 minutes prior to your appointment for registration. Make certain not to have anything to eat or drink 6 hours prior to your appointment. Should you need to reschedule your appointment, please contact radiology at [phone]. This test typically takes about 30 minutes to perform. _________________________________________  Sabrina Guerrero have been scheduled for an endoscopy and colonoscopy. Please follow the written instructions given to you at your visit today. Please pick up your prep supplies at the pharmacy within the next 1-3 days. If you use inhalers (even only as needed), please bring them with you on the day of your procedure. Your physician has requested that you go to www.startemmi.com and enter the access code given to you at your visit today. This web site gives a general overview about your procedure. However, you should still follow specific instructions given to you by our office regarding your preparation for the procedure. _________________________________________ If you are age 65 or older, your body mass index should be between 23-30. Your Body mass index is 29.3 kg/m. If this is out of the aforementioned range listed, please consider follow up with your Primary Care Provider.  If you are age 82 or younger, your body mass index should be between 19-25. Your Body mass index is 29.3 kg/m. If this is out of the aformentioned range listed, please consider follow up with your Primary Care Provider.  __________________________________________ Due to recent changes in healthcare laws, you may see the results of your imaging and laboratory studies on MyChart before your provider has had a chance to review them.  We understand that in some cases there may be results that are confusing or concerning to you. Not all laboratory results come back in the same  time frame and the provider may be waiting for multiple results in order to interpret others.  Please give [place] 48 hours in order for your provider to thoroughly review all the results before contacting the office for clarification of your results.

## 2019-06-21 ENCOUNTER — Other Ambulatory Visit: Payer: Self-pay | Admitting: Neurology

## 2019-06-23 ENCOUNTER — Other Ambulatory Visit: Payer: Self-pay | Admitting: Neurology

## 2019-06-23 ENCOUNTER — Ambulatory Visit (HOSPITAL_COMMUNITY): Payer: Medicare Other

## 2019-06-23 NOTE — Telephone Encounter (Signed)
Spoke with patient and informed her NP refilled Trileptal in Feb with 3 additional refills to [facility]. Patient verbalized understanding, appreciation.

## 2019-06-23 NOTE — Telephone Encounter (Signed)
Patient requesting refill of oxcarbazepine 150mg . Best call back is [phone]

## 2019-06-25 ENCOUNTER — Telehealth: Payer: Self-pay | Admitting: Neurology

## 2019-06-25 ENCOUNTER — Ambulatory Visit (HOSPITAL_COMMUNITY): Payer: Medicare Other

## 2019-06-25 NOTE — Telephone Encounter (Signed)
1) Medication(s) Requested (by name): OXcarbazepine (TRILEPTAL) 150 MG tablet   2) Pharmacy of Choice: [facility]  [phone] [place], [place] [address]  3) Special Requests:  Pt states she was informed by Pharmacy they dont have additional refills on file for pt

## 2019-06-25 NOTE — Telephone Encounter (Signed)
[facility], spoke with Sabrina Guerrero who took information re: trileptal refilled in Feb x 1 year.  She stated she would send the message to pharmacy, and our office will get a call back in 24 hours.

## 2019-06-30 ENCOUNTER — Other Ambulatory Visit: Payer: Self-pay | Admitting: Neurology

## 2019-06-30 ENCOUNTER — Ambulatory Visit (HOSPITAL_COMMUNITY)
Admission: RE | Admit: 2019-06-30 | Discharge: 2019-06-30 | Disposition: A | Discharge status: Home / Self Care | Payer: Medicare Other | Source: Ambulatory Visit | Attending: Internal Medicine | Admitting: Internal Medicine

## 2019-06-30 ENCOUNTER — Other Ambulatory Visit: Payer: Self-pay

## 2019-06-30 DIAGNOSIS — R1010 Upper abdominal pain, unspecified: Secondary | ICD-10-CM | POA: Diagnosis not present

## 2019-06-30 DIAGNOSIS — K219 Gastro-esophageal reflux disease without esophagitis: Secondary | ICD-10-CM | POA: Diagnosis not present

## 2019-06-30 DIAGNOSIS — R143 Flatulence: Secondary | ICD-10-CM | POA: Diagnosis not present

## 2019-06-30 DIAGNOSIS — N281 Cyst of kidney, acquired: Secondary | ICD-10-CM | POA: Diagnosis not present

## 2019-06-30 MED ORDER — OXCARBAZEPINE 150 MG PO TABS: 150.0000 mg | ORAL_TABLET | Freq: Two times a day (BID) | ORAL | 3 refills | Status: DC

## 2019-06-30 NOTE — Telephone Encounter (Signed)
Trileptal refill sent to [facility].

## 2019-06-30 NOTE — Telephone Encounter (Signed)
Pt is needing a refill on her OXcarbazepine (TRILEPTAL) 150 MG tablet sent [facility] the [facility] on [address]

## 2019-08-04 ENCOUNTER — Encounter: Payer: Medicare Other | Admitting: Internal Medicine

## 2019-08-04 ENCOUNTER — Telehealth: Payer: Self-pay | Admitting: Internal Medicine

## 2019-08-04 NOTE — Telephone Encounter (Signed)
For the record, she should have been quite aware of her appointment.  First, on the day of her office visit she was given all of her instructions regarding her endoscopy procedures.  These were provided in great detail including times and dates.  Next, she was notified regarding her abdominal ultrasound and its results.  She was contacted by Delila Spence RN July 07, 2018.  All of this is documented.  Thanks for letting me know.

## 2019-09-01 ENCOUNTER — Ambulatory Visit: Payer: Medicare Other | Attending: Family

## 2019-09-01 DIAGNOSIS — Z23 Encounter for immunization: Secondary | ICD-10-CM

## 2019-09-01 NOTE — Progress Notes (Signed)
   Covid-19 Vaccination Clinic  Name:  Sabrina Guerrero    MRN: [id number] DOB: May 18, 1945  09/01/2019  Sabrina Guerrero was observed post Covid-19 immunization for 15 minutes without incident. She was provided with Vaccine Information Sheet and instruction to access the V-Safe system.   Sabrina Guerrero was instructed to call 911 with any severe reactions post vaccine: Marland Kitchen Difficulty breathing  . Swelling of face and throat  . A fast heartbeat  . A bad rash all over body  . Dizziness and weakness   Immunizations Administered    Name Date Dose VIS Date Route   Pfizer COVID-19 Vaccine 09/01/2019  1:51 PM 0.3 mL 04/01/2018 Intramuscular   Manufacturer: [employer]   Lot: [id number]   [place]: [id number]

## 2019-09-22 ENCOUNTER — Ambulatory Visit: Payer: Medicare Other | Attending: Internal Medicine

## 2019-09-22 DIAGNOSIS — Z23 Encounter for immunization: Secondary | ICD-10-CM

## 2019-09-22 NOTE — Progress Notes (Signed)
   Covid-19 Vaccination Clinic  Name:  Sabrina Guerrero    MRN: [id number] DOB: 1946/01/12  09/22/2019  [name] was observed post Covid-19 immunization for 30 minutes based on pre-vaccination screening without incident. She was provided with Vaccine Information Sheet and instruction to access the V-Safe system.   [name] was instructed to call 911 with any severe reactions post vaccine: Marland Kitchen Difficulty breathing  . Swelling of face and throat  . A fast heartbeat  . A bad rash all over body  . Dizziness and weakness   Immunizations Administered    Name Date Dose VIS Date Route   Pfizer COVID-19 Vaccine 09/22/2019  1:37 PM 0.3 mL 04/01/2018 Intramuscular   Manufacturer: [place]   Lot: [id number]   [place]: [id number]

## 2019-09-24 ENCOUNTER — Telehealth: Payer: Self-pay | Admitting: Internal Medicine

## 2019-09-24 MED ORDER — OMEPRAZOLE 20 MG PO CPDR: 40.0000 mg | DELAYED_RELEASE_CAPSULE | Freq: Two times a day (BID) | ORAL | 2 refills | Status: DC

## 2019-09-24 NOTE — Telephone Encounter (Signed)
Refilled Omeprazole 

## 2019-12-04 ENCOUNTER — Other Ambulatory Visit: Payer: Self-pay

## 2019-12-04 ENCOUNTER — Encounter (HOSPITAL_COMMUNITY): Payer: Self-pay

## 2019-12-04 DIAGNOSIS — S0083XA Contusion of other part of head, initial encounter: Secondary | ICD-10-CM | POA: Diagnosis not present

## 2019-12-04 DIAGNOSIS — W010XXA Fall on same level from slipping, tripping and stumbling without subsequent striking against object, initial encounter: Secondary | ICD-10-CM | POA: Diagnosis not present

## 2019-12-04 DIAGNOSIS — E119 Type 2 diabetes mellitus without complications: Secondary | ICD-10-CM | POA: Insufficient documentation

## 2019-12-04 DIAGNOSIS — Y9301 Activity, walking, marching and hiking: Secondary | ICD-10-CM | POA: Insufficient documentation

## 2019-12-04 DIAGNOSIS — S022XXA Fracture of nasal bones, initial encounter for closed fracture: Secondary | ICD-10-CM | POA: Diagnosis not present

## 2019-12-04 DIAGNOSIS — S0990XA Unspecified injury of head, initial encounter: Secondary | ICD-10-CM | POA: Diagnosis present

## 2019-12-04 NOTE — ED Triage Notes (Signed)
Pt reports 2 dalls Wednesday afternoon while walking dogs. C/o facial pain, nasal pain, and loose teeth. NIH-0.

## 2019-12-05 ENCOUNTER — Emergency Department (HOSPITAL_COMMUNITY): Payer: No Typology Code available for payment source

## 2019-12-05 ENCOUNTER — Emergency Department (HOSPITAL_COMMUNITY)
Admission: EM | Admit: 2019-12-05 | Discharge: 2019-12-05 | Disposition: A | Discharge status: Home / Self Care | Payer: No Typology Code available for payment source | Attending: Emergency Medicine | Admitting: Emergency Medicine

## 2019-12-05 DIAGNOSIS — S022XXA Fracture of nasal bones, initial encounter for closed fracture: Secondary | ICD-10-CM

## 2019-12-05 DIAGNOSIS — S0083XA Contusion of other part of head, initial encounter: Secondary | ICD-10-CM

## 2019-12-05 NOTE — ED Provider Notes (Signed)
[facility] DEPT Provider Note   CSN: [id number] Arrival date & time: 12/04/19  2209     History Chief Complaint  Patient presents with  . Fall    Sabrina Guerrero is a 74 y.o. female.  Patient presents to the emergency department for evaluation after 2 falls that occurred 2 days ago.  Patient reports that she was walking the dogs when she fell.  The first time she slipped on some loose ground after it had drained and fell into a seated position.  She did not injure herself.  Denies tailbone/back pain.  She then tripped over a broken curb and fell, hitting her forehead and the right side of her face.  She has had some facial swelling and pain with a mild headache since.  Her daughter came in from out of town and saw the swelling, brought her to the emergency department to be evaluated. The other complaint she has a slight right knee pain from the fall.        Past Medical History:  Diagnosis Date  . Diabetes mellitus without complication ([place])   . Gastroesophageal reflux disease   . Memory deficit 05/20/2014  . Partial symptomatic epilepsy with complex partial seizures, not intractable, without status epilepticus ([place]) 05/20/2014  . Ventral hernia     Patient Active Problem List   Diagnosis Date Noted  . Partial symptomatic epilepsy with complex partial seizures, not intractable, without status epilepticus ([place]) 05/20/2014  . Memory deficit 05/20/2014  . Altered mental status 03/11/2013  . Trapezius muscle spasm 12/03/2012  . Generalized anxiety disorder 12/03/2012  . Hx SBO 11/18/2012  . GERD (gastroesophageal reflux disease) 11/18/2012  . DM w/o complication type II ([place]) 11/18/2012  . Left thyroid nodule 11/18/2012  . 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 TUBAL LIGATION    . VENTRAL HERNIA REPAIR       OB History    Gravida  3   Para  3   Term  3   Preterm      AB       Living  3     SAB      TAB      Ectopic      Multiple      Live Births              Family History  Problem Relation Age of Onset  . Arthritis Mother   . Cancer Brother        lung  . Colon cancer Neg Hx   . Esophageal cancer Neg Hx   . Stomach cancer Neg Hx   . Liver disease Neg Hx     Social History   Tobacco Use  . Smoking status: Never Smoker  . Smokeless tobacco: Never Used  Substance Use Topics  . Alcohol use: No  . Drug use: No    Home Medications Prior to Admission medications   Medication Sig Start Date End Date Taking? Authorizing Provider  acetaminophen (TYLENOL) 500 MG tablet Take 1 tablet (500 mg total) by mouth every 6 (six) hours as needed. 03/05/18  Yes Wurst, [place], PA-C  omeprazole (PRILOSEC) 20 MG capsule Take 2 capsules (40 mg total) by mouth 2 (two) times daily before a meal. 09/24/19  Yes Irene Shipper, MD  OXcarbazepine (TRILEPTAL) 150 MG tablet Take 1 tablet (150 mg total) by mouth 2 (two) times daily. 06/30/19  Yes Suzzanne Cloud,  NP    Allergies    Keppra [levetiracetam]  Review of Systems   Review of Systems  HENT: Positive for facial swelling.   Musculoskeletal: Positive for arthralgias.  Neurological: Positive for headaches.  All other systems reviewed and are negative.   Physical Exam Updated Vital Signs BP (!) 160/86 (BP Location: Right Arm)   Pulse 83   Temp 98.7 F (37.1 C) (Oral)   Resp 18   Ht 5\' 2"  (1.575 m)   Wt 72.6 kg   SpO2 99%   BMI 29.26 kg/m   Physical Exam Vitals and nursing note reviewed.  Constitutional:      General: She is not in acute distress.    Appearance: Normal appearance. She is well-developed.  HENT:     Head: Normocephalic.     Comments: Slight infraorbital bruising - R eye Swelling R lower lip with mucosal contusioin, no lac    Right Ear: Hearing normal.     Left Ear: Hearing normal.     Nose: Nose normal.  Eyes:     Conjunctiva/sclera: Conjunctivae normal.     Pupils: Pupils  are equal, round, and reactive to light.  Cardiovascular:     Rate and Rhythm: Regular rhythm.     Heart sounds: S1 normal and S2 normal. No murmur heard.  No friction rub. No gallop.   Pulmonary:     Effort: Pulmonary effort is normal. No respiratory distress.     Breath sounds: Normal breath sounds.  Chest:     Chest wall: No tenderness.  Abdominal:     General: Bowel sounds are normal.     Palpations: Abdomen is soft.     Tenderness: There is no abdominal tenderness. There is no guarding or rebound. Negative signs include Murphy's sign and McBurney's sign.     Hernia: No hernia is present.  Musculoskeletal:        General: Normal range of motion.     Cervical back: Normal, normal range of motion and neck supple.     Thoracic back: Normal.     Lumbar back: Normal.     Right knee: No swelling, deformity, effusion, erythema, ecchymosis, lacerations or bony tenderness. Normal range of motion. Tenderness (anterior) present. No LCL laxity, MCL laxity, ACL laxity or PCL laxity. Normal alignment. Normal pulse.     Left knee: Normal.  Skin:    General: Skin is warm and dry.     Findings: No abrasion, bruising, ecchymosis, laceration or rash.  Neurological:     Mental Status: She is alert and oriented to person, place, and time.     GCS: GCS eye subscore is 4. GCS verbal subscore is 5. GCS motor subscore is 6.     Cranial Nerves: No cranial nerve deficit.     Sensory: No sensory deficit.     Coordination: Coordination normal.  Psychiatric:        Speech: Speech normal.        Behavior: Behavior normal.        Thought Content: Thought content normal.     ED Results / Procedures / Treatments   Labs (all labs ordered are listed, but only abnormal results are displayed) Labs Reviewed - No data to display  EKG None  Radiology CT HEAD WO CONTRAST  Result Date: 12/05/2019 CLINICAL DATA:  Fall, facial trauma, facial and nasal pain, loosen dentition EXAM: CT HEAD WITHOUT CONTRAST CT  MAXILLOFACIAL WITHOUT CONTRAST TECHNIQUE: Multidetector CT imaging of the head and maxillofacial structures were  performed using the standard protocol without intravenous contrast. Multiplanar CT image reconstructions of the maxillofacial structures were also generated. COMPARISON:  None. FINDINGS: CT HEAD FINDINGS Brain: Normal anatomic configuration. No abnormal intra or extra-axial mass lesion or fluid collection. No abnormal mass effect or midline shift. No evidence of acute intracranial hemorrhage or infarct. Ventricular size is normal. Cerebellum unremarkable. Vascular: Unremarkable Skull: Intact Other: Mastoid air cells and middle ear cavities are clear. CT MAXILLOFACIAL FINDINGS Osseous: There is an acute, minimally displaced fracture of the right nasal bone. Periapical abscesses and caries involve the residual maxillary and, to a lesser extent, mandibular incisors as well as the right maxillary canine with significant undermining of the residual right maxillary incisor. Orbits: The orbits are unremarkable. Sinuses: Small mucous retention cysts or polyps within the left sphenoid sinus. Remaining paranasal sinuses are clear. Soft tissues: Soft tissue swelling noted anterior to the mandibular mentum. Facial soft tissues are otherwise unremarkable. IMPRESSION: No acute intracranial injury.  No calvarial fracture. Minimally displaced right nasal bone fracture. Near anatomic alignment. Extensive periodontal disease as described above. Mild soft tissue swelling superficial to the mandibular mentum. No mandibular fracture associated. Electronically Signed   By: Fidela Salisbury MD   On: 12/05/2019 01:43   DG Knee Complete 4 Views Right  Result Date: 12/05/2019 CLINICAL DATA:  Fall EXAM: RIGHT KNEE - COMPLETE 4+ VIEW COMPARISON:  None. FINDINGS: Moderate right knee osteoarthrosis. No fracture or dislocation. No joint effusion. IMPRESSION: No fracture or dislocation of the right knee. Electronically Signed   By:  Ulyses Jarred M.D.   On: 12/05/2019 01:08   CT MAXILLOFACIAL WO CONTRAST  Result Date: 12/05/2019 CLINICAL DATA:  Fall, facial trauma, facial and nasal pain, loosen dentition EXAM: CT HEAD WITHOUT CONTRAST CT MAXILLOFACIAL WITHOUT CONTRAST TECHNIQUE: Multidetector CT imaging of the head and maxillofacial structures were performed using the standard protocol without intravenous contrast. Multiplanar CT image reconstructions of the maxillofacial structures were also generated. COMPARISON:  None. FINDINGS: CT HEAD FINDINGS Brain: Normal anatomic configuration. No abnormal intra or extra-axial mass lesion or fluid collection. No abnormal mass effect or midline shift. No evidence of acute intracranial hemorrhage or infarct. Ventricular size is normal. Cerebellum unremarkable. Vascular: Unremarkable Skull: Intact Other: Mastoid air cells and middle ear cavities are clear. CT MAXILLOFACIAL FINDINGS Osseous: There is an acute, minimally displaced fracture of the right nasal bone. Periapical abscesses and caries involve the residual maxillary and, to a lesser extent, mandibular incisors as well as the right maxillary canine with significant undermining of the residual right maxillary incisor. Orbits: The orbits are unremarkable. Sinuses: Small mucous retention cysts or polyps within the left sphenoid sinus. Remaining paranasal sinuses are clear. Soft tissues: Soft tissue swelling noted anterior to the mandibular mentum. Facial soft tissues are otherwise unremarkable. IMPRESSION: No acute intracranial injury.  No calvarial fracture. Minimally displaced right nasal bone fracture. Near anatomic alignment. Extensive periodontal disease as described above. Mild soft tissue swelling superficial to the mandibular mentum. No mandibular fracture associated. Electronically Signed   By: Fidela Salisbury MD   On: 12/05/2019 01:43    Procedures Procedures (including critical care time)  Medications Ordered in ED Medications -  No data to display  ED Course  I have reviewed the triage vital signs and the nursing notes.  Pertinent labs & imaging results that were available during my care of the patient were reviewed by me and considered in my medical decision making (see chart for details).    MDM Rules/Calculators/A&P  Patient presents for evaluation after 2 falls that occurred 2 days ago.  Patient complaining of mild right knee pain.  Examination reveals normal range of motion, no deformity.  X-ray does not show any acute abnormality.  Patient complaining of headache, facial swelling after hitting her head on the ground.  She does have a contusion of the lip without any laceration that needs repair, no sign of infection.  CT head and maxillofacial bones performed.  The only finding is a nondisplaced nasal bone fracture does not require any intervention.  No blood in the sinuses.  No nasal septal hematoma.  Patient reassured, no further work-up necessary.  Final Clinical Impression(s) / ED Diagnoses Final diagnoses:  Contusion of face, initial encounter  Closed fracture of nasal bone, initial encounter    Rx / DC Orders ED Discharge Orders    None       Kelsei Defino, Gwenyth Allegra, MD 12/05/19 [phone]

## 2020-01-06 HISTORY — PX: SUBDURAL HEMATOMA EVACUATION VIA CRANIOTOMY: SUR319

## 2020-02-01 ENCOUNTER — Telehealth: Payer: Self-pay | Admitting: Neurology

## 2020-02-01 DIAGNOSIS — W01198A Fall on same level from slipping, tripping and stumbling with subsequent striking against other object, initial encounter: Secondary | ICD-10-CM | POA: Diagnosis not present

## 2020-02-01 DIAGNOSIS — S065X0A Traumatic subdural hemorrhage without loss of consciousness, initial encounter: Secondary | ICD-10-CM | POA: Diagnosis not present

## 2020-02-01 DIAGNOSIS — W101XXA Fall (on)(from) sidewalk curb, initial encounter: Secondary | ICD-10-CM | POA: Insufficient documentation

## 2020-02-01 DIAGNOSIS — S065XAA Traumatic subdural hemorrhage with loss of consciousness status unknown, initial encounter: Secondary | ICD-10-CM | POA: Insufficient documentation

## 2020-02-01 DIAGNOSIS — K219 Gastro-esophageal reflux disease without esophagitis: Secondary | ICD-10-CM | POA: Diagnosis not present

## 2020-02-01 DIAGNOSIS — Y998 Other external cause status: Secondary | ICD-10-CM | POA: Diagnosis not present

## 2020-02-01 DIAGNOSIS — Y92009 Unspecified place in unspecified non-institutional (private) residence as the place of occurrence of the external cause: Secondary | ICD-10-CM | POA: Diagnosis not present

## 2020-02-01 DIAGNOSIS — Z9181 History of falling: Secondary | ICD-10-CM | POA: Diagnosis not present

## 2020-02-01 DIAGNOSIS — S065X9A Traumatic subdural hemorrhage with loss of consciousness of unspecified duration, initial encounter: Secondary | ICD-10-CM | POA: Diagnosis not present

## 2020-02-01 DIAGNOSIS — E119 Type 2 diabetes mellitus without complications: Secondary | ICD-10-CM | POA: Diagnosis not present

## 2020-02-01 NOTE — Telephone Encounter (Signed)
I called daughter, danyell.  She is driving in from [facility] for pt.  She is going to ED [facility] for fall after starring spell.  ? Seizures.  She has had multiple falls 12-05-19 then today.  Not sure related to seizures.   I made earlier f/u tomorrow at 1545 but if after ED eval this may need to see Dr. Anne Hahn but will hold off until more information.  She verbalized understanding.

## 2020-02-01 NOTE — Telephone Encounter (Signed)
Pt's daughter Maecyn Panning on [place] called wanting to speak to the RN about some falls the pt has been having. Pt seems to not be all there and then fall flat on her face. Please advise.

## 2020-02-02 ENCOUNTER — Ambulatory Visit: Payer: Self-pay | Admitting: Neurology

## 2020-02-02 DIAGNOSIS — S065X9A Traumatic subdural hemorrhage with loss of consciousness of unspecified duration, initial encounter: Secondary | ICD-10-CM | POA: Diagnosis not present

## 2020-02-02 DIAGNOSIS — S065X9D Traumatic subdural hemorrhage with loss of consciousness of unspecified duration, subsequent encounter: Secondary | ICD-10-CM | POA: Diagnosis not present

## 2020-02-02 NOTE — Telephone Encounter (Signed)
I called spoke to Duwayne Heck, daughter of pt.  Pt is admitted to [facility] for SDH due to falls.  Has consult with neurosurgery.  Will need f/u with Dr. Anne Hahn.  They will call us to make appt when pt is discharged.  I relayed will send message to SS/NP and Dr. Anne Hahn as Lorain Childes.

## 2020-02-02 NOTE — Telephone Encounter (Signed)
Noted in care everywhere, CT head and cervical shows done pending results.  Falls/dizziness as why seen in ED.  Since not seizure/ see pcp need referral to MD for new problem?

## 2020-02-02 NOTE — Progress Notes (Deleted)
PATIENT: Sabrina Guerrero DOB: [birth year]  REASON FOR VISIT: follow up HISTORY FROM: patient  HISTORY OF PRESENT ILLNESS: Today 02/02/20  HISTORY 04/01/2019 SS: Sabrina Guerrero is a 74 year old female with history of seizure type events.  She remains on Trileptal 150 mg twice daily.  Her last seizure was in 2014.  She is tolerating the medication without side effect.  She has not had recurrent seizure.  Her grandchildren live with her.  She drives a car without difficulty and performs all of her own ADLs.  She says her overall health has been good.  She presents today for evaluation unaccompanied.   REVIEW OF SYSTEMS: Out of a complete 14 system review of symptoms, the patient complains only of the following symptoms, and all other reviewed systems are negative.  ALLERGIES: Allergies  Allergen Reactions  . Keppra [Levetiracetam]     Malaise    HOME MEDICATIONS: Outpatient Medications Prior to Visit  Medication Sig Dispense Refill  . acetaminophen (TYLENOL) 500 MG tablet Take 1 tablet (500 mg total) by mouth every 6 (six) hours as needed. 30 tablet 0  . omeprazole (PRILOSEC) 20 MG capsule Take 2 capsules (40 mg total) by mouth 2 (two) times daily before a meal. 120 capsule 2  . OXcarbazepine (TRILEPTAL) 150 MG tablet Take 1 tablet (150 mg total) by mouth 2 (two) times daily. 180 tablet 3   No facility-administered medications prior to visit.    PAST MEDICAL HISTORY: Past Medical History:  Diagnosis Date  . Diabetes mellitus without complication (H[facility])   . Gastroesophageal reflux disease   . Memory deficit 05/20/2014  . Partial symptomatic epilepsy with complex partial seizures, not intractable, without status epilepticus ([facility]) 05/20/2014  . Ventral hernia     PAST SURGICAL HISTORY: Past Surgical History:  Procedure Laterality Date  . TUBAL LIGATION    . VENTRAL HERNIA REPAIR      FAMILY HISTORY: Family History  Problem Relation Age of Onset  . Arthritis Mother   . Cancer  Brother        lung  . Colon cancer Neg Hx   . Esophageal cancer Neg Hx   . Stomach cancer Neg Hx   . Liver disease Neg Hx     SOCIAL HISTORY: Social History   Socioeconomic History  . Marital status: Divorced    Spouse name: Not on file  . Number of children: 3  . Years of education: 18 years  . Highest education level: Not on file  Occupational History  . Occupation: unemployed  Tobacco Use  . Smoking status: Never Smoker  . Smokeless tobacco: Never Used  Substance and Sexual Activity  . Alcohol use: No  . Drug use: No  . Sexual activity: Never  Other Topics Concern  . Not on file  Social History Narrative   Patient is right handed.   Patient does not drink caffeine.   Social Determinants of Health   Financial Resource Strain: Not on file  Food Insecurity: Not on file  Transportation Needs: Not on file  Physical Activity: Not on file  Stress: Not on file  Social Connections: Not on file  Intimate Partner Violence: Not on file      PHYSICAL EXAM  There were no vitals filed for this visit. There is no height or weight on file to calculate BMI.  Generalized: Well developed, in no acute distress   Neurological examination  Mentation: Alert oriented to time, place, history taking. Follows all commands speech and language  fluent Cranial nerve II-XII: Pupils were equal round reactive to light. Extraocular movements were full, visual field were full on confrontational test. Facial sensation and strength were normal. Uvula tongue midline. Head turning and shoulder shrug  were normal and symmetric. Motor: The motor testing reveals 5 over 5 strength of all 4 extremities. Good symmetric motor tone is noted throughout.  Sensory: Sensory testing is intact to soft touch on all 4 extremities. No evidence of extinction is noted.  Coordination: Cerebellar testing reveals good finger-nose-finger and heel-to-shin bilaterally.  Gait and station: Gait is normal. Tandem gait is  normal. Romberg is negative. No drift is seen.  Reflexes: Deep tendon reflexes are symmetric and normal bilaterally.   DIAGNOSTIC DATA (LABS, IMAGING, TESTING) - I reviewed patient records, labs, notes, testing and imaging myself where available.  Lab Results  Component Value Date   WBC 4.8 04/01/2019   HGB 12.7 04/01/2019   HCT 38.3 04/01/2019   MCV 94 04/01/2019   PLT 213 04/01/2019      Component Value Date/Time   NA 138 04/01/2019 1407   K 4.4 04/01/2019 1407   CL 99 04/01/2019 1407   CO2 25 04/01/2019 1407   GLUCOSE 150 (H) 04/01/2019 1407   GLUCOSE 103 (H) 04/11/2018 1346   BUN 10 04/01/2019 1407   CREATININE 0.83 04/01/2019 1407   CALCIUM 9.3 04/01/2019 1407   PROT 7.3 04/01/2019 1407   ALBUMIN 3.8 04/01/2019 1407   AST 16 04/01/2019 1407   ALT 8 04/01/2019 1407   ALKPHOS 91 04/01/2019 1407   BILITOT <0.2 04/01/2019 1407   GFRNONAA 70 04/01/2019 1407   GFRAA 81 04/01/2019 1407   Lab Results  Component Value Date   CHOL 233 (H) 11/18/2012   HDL 41.10 11/18/2012   LDLDIRECT 178.0 11/18/2012   TRIG 62.0 11/18/2012   CHOLHDL 6 11/18/2012   Lab Results  Component Value Date   HGBA1C 6.5 11/18/2012   Lab Results  Component Value Date   VITAMINB12 510 11/18/2012   Lab Results  Component Value Date   TSH 0.65 11/18/2012      ASSESSMENT AND PLAN 74 y.o. year old female  has a past medical history of Diabetes mellitus without complication ([place]), Gastroesophageal reflux disease, Memory deficit (05/20/2014), Partial symptomatic epilepsy with complex partial seizures, not intractable, without status epilepticus ([place]) (05/20/2014), and Ventral hernia. here with ***   I spent 15 minutes with the patient. 50% of this time was spent   Butler Denmark, [place], DNP 02/02/2020, 5:57 AM [facility] [address] [place], [place] [address] [phone]

## 2020-02-03 NOTE — Telephone Encounter (Signed)
Events noted

## 2020-02-08 NOTE — Telephone Encounter (Signed)
Pt.'s daughter Dorna Bloom called into see if Dr. Anne Hahn can look at notes of mom's surgery last week. Also to receive a call back regarding mom being seen in office. Please advise.

## 2020-02-09 NOTE — Telephone Encounter (Signed)
I called and talk with the sister.  The patient is back home from the hospital, apparently doing fairly well does not even need physical therapy.  She damaged the tooth with the fall, will need to see a dentist.  I will try to get an earlier 14 revisit.

## 2020-02-10 NOTE — Telephone Encounter (Signed)
Reached Danyelle by phone, scheduled patient in with Dr. Anne Hahn 03/23/20 @ 1420.  Verbalized understanding to arrive at1400.  Expressed appreciation for the call.

## 2020-03-07 DIAGNOSIS — Z9889 Other specified postprocedural states: Secondary | ICD-10-CM | POA: Diagnosis not present

## 2020-03-07 DIAGNOSIS — Z8679 Personal history of other diseases of the circulatory system: Secondary | ICD-10-CM | POA: Diagnosis not present

## 2020-03-23 ENCOUNTER — Ambulatory Visit (INDEPENDENT_AMBULATORY_CARE_PROVIDER_SITE_OTHER): Payer: Medicare Other | Admitting: Neurology

## 2020-03-23 ENCOUNTER — Encounter: Payer: Self-pay | Admitting: Neurology

## 2020-03-23 VITALS — BP 120/76 | HR 84 | Ht 62.0 in | Wt 161.6 lb

## 2020-03-23 DIAGNOSIS — Z87828 Personal history of other (healed) physical injury and trauma: Secondary | ICD-10-CM | POA: Diagnosis not present

## 2020-03-23 DIAGNOSIS — G40209 Localization-related (focal) (partial) symptomatic epilepsy and epileptic syndromes with complex partial seizures, not intractable, without status epilepticus: Secondary | ICD-10-CM

## 2020-03-23 HISTORY — DX: Personal history of other (healed) physical injury and trauma: Z87.828

## 2020-03-23 NOTE — Progress Notes (Signed)
Reason for visit: Seizures, recent subdural hematoma  Sabrina Guerrero is a 75 y.o. female  History of present illness:  Sabrina Guerrero is a 75 year old right-handed black female with a history of well-controlled seizures on a very low-dose of Trileptal taking 150 mg twice daily.  She has not had a seizure in several years.  She apparently had a fall on 05 December 2019, she sustained facial trauma.  She went to the emergency room and a CT scan of the head was unremarkable.  The patient had a couple of minor falls several weeks later, but went to the hospital on 01 February 2020 after the family noted that she was having significant gait instability with falls and was not acting completely normal.  Bilateral subdural hematoma were noted at that time, she required surgical decompression.  She has recovered well from this.  She indicates that she has not had any seizures around this time, the seizures did not cause any falls.  She has stopped operating a motor vehicle for now until she fully recovers.  Another CT of the head is planned for the future.  She has had blood work done through her primary care physician 2 weeks ago.  She reports no headache, vision change, speech problem, or difficulty with numbness or weakness of the face, arms, or legs.  Her gait stability is normal.  She denies any problems controlling the bowels or the bladder.  Past Medical History:  Diagnosis Date  . Diabetes mellitus without complication ([place])   . Gastroesophageal reflux disease   . 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 Memory deficit 05/20/2014  . Partial symptomatic epilepsy with complex partial seizures, not intractable, without status epilepticus ([place]) 05/20/2014  . Ventral hernia     Past Surgical History:  Procedure Laterality Date  . TUBAL LIGATION    . VENTRAL HERNIA REPAIR      Family History  Problem Relation Age of Onset  . Arthritis Mother   .  Cancer Brother        lung  . Colon cancer Neg Hx   . Esophageal cancer Neg Hx   . Stomach cancer Neg Hx   . Liver disease Neg Hx     Social history:  reports that she has never smoked. She has never used smokeless tobacco. She reports that she does not drink alcohol and does not use drugs.  Medications:  Prior to Admission medications   Medication Sig Start Date End Date Taking? Authorizing Provider  acetaminophen (TYLENOL) 500 MG tablet Take 1 tablet (500 mg total) by mouth every 6 (six) hours as needed. 03/05/18  Yes Wurst, [place], PA-C  omeprazole (PRILOSEC) 20 MG capsule Take 2 capsules (40 mg total) by mouth 2 (two) times daily before a meal. 09/24/19  Yes Irene Shipper, MD  OXcarbazepine (TRILEPTAL) 150 MG tablet Take 1 tablet (150 mg total) by mouth 2 (two) times daily. 06/30/19  Yes Suzzanne Cloud, NP      Allergies  Allergen Reactions  . Keppra [Levetiracetam]     Malaise    ROS:  Out of a complete 14 system review of symptoms, the patient complains only of the following symptoms, and all other reviewed systems are negative.  Falls History of seizures  Blood pressure 120/76, pulse 84, height 5\' 2"  (1.575 m), weight 161 lb 9.6 oz (73.3 kg).  Physical Exam  General: The patient is alert and cooperative at the time  of the examination.  Eyes: Pupils are equal, round, and reactive to light. Discs are flat bilaterally.  Neck: The neck is supple, no carotid bruits are noted on the right, the patient has a left carotid bruit.  Respiratory: The respiratory examination is clear.  Cardiovascular: The cardiovascular examination reveals a regular rate and rhythm, no obvious murmurs or rubs are noted.  Skin: Extremities are without significant edema.  Neurologic Exam  Mental status: The patient is alert and oriented x 3 at the time of the examination. The patient has apparent normal recent and remote memory, with an apparently normal attention span and concentration  ability.  Cranial nerves: Facial symmetry is present. There is good sensation of the face to pinprick and soft touch bilaterally. The strength of the facial muscles and the muscles to head turning and shoulder shrug are normal bilaterally. Speech is well enunciated, no aphasia or dysarthria is noted. Extraocular movements are full. Visual fields are full. The tongue is midline, and the patient has symmetric elevation of the soft palate. No obvious hearing deficits are noted.  Motor: The motor testing reveals 5 over 5 strength of all 4 extremities. Good symmetric motor tone is noted throughout.  Sensory: Sensory testing is intact to pinprick, soft touch and vibration sensation on all 4 extremities. No evidence of extinction is noted.  Coordination: Cerebellar testing reveals good finger-nose-finger and heel-to-shin bilaterally.  Gait and station: Gait is normal. Tandem gait is normal. Romberg is negative. No drift is seen.  Reflexes: Deep tendon reflexes are symmetric, but are depressed bilaterally. Toes are downgoing bilaterally.   CTA head and neck 02/02/20:  CONCLUSION:    Redemonstrated large bilateral subdural hematomas along both cerebral convexities as described on same-day head CT.   No large vessel occlusion, hemodynamically significant stenosis, or intracranial aneurysm identified.   No large vessel occlusion, significant stenosis, or dissection. Approximately, 40% stenosis of the proximal left ICA secondary to atherosclerotic plaque.   Enlarged multinodular thyroid gland better evaluated by recent thyroid ultrasound.   CT head 02/01/20:  Impression  Mixed attenuation, likely acute on subacute or acute on chronic, bilateral frontoparietal convexity subdural hematomas with corresponding local mass effect but no midline shift or evidence of herniation or hydrocephalus.     Assessment/Plan:  1.  History of seizures, well controlled  2.  Recent bilateral subdural  hematoma  3.  Left carotid bruit  The patient fortunately has recovered well following the subdural.  The patient reports no recent seizure events.  She will continue her Trileptal at the current dose, she will follow up in 1 year, sooner if needed.  She has had recent blood work done through her primary care physician.  The patient has a left carotid bruit, but a recent CT angiogram revealed no significant left carotid artery stenosis.  Jill Alexanders MD 03/23/2020 2:51 PM  [facility] [address] [place], [place] [address]  Phone [phone] Fax [phone]

## 2020-03-31 ENCOUNTER — Ambulatory Visit: Payer: Medicare Other | Admitting: Neurology

## 2020-04-07 DIAGNOSIS — Z9889 Other specified postprocedural states: Secondary | ICD-10-CM | POA: Diagnosis not present

## 2020-04-07 DIAGNOSIS — S065X9A Traumatic subdural hemorrhage with loss of consciousness of unspecified duration, initial encounter: Secondary | ICD-10-CM | POA: Diagnosis not present

## 2020-04-07 DIAGNOSIS — S065X9D Traumatic subdural hemorrhage with loss of consciousness of unspecified duration, subsequent encounter: Secondary | ICD-10-CM | POA: Diagnosis not present

## 2020-04-08 ENCOUNTER — Emergency Department
Admission: EM | Admit: 2020-04-08 | Discharge: 2020-04-08 | Disposition: A | Discharge status: Home / Self Care | Payer: No Typology Code available for payment source | Attending: Emergency Medicine | Admitting: Emergency Medicine

## 2020-04-08 ENCOUNTER — Other Ambulatory Visit: Payer: Self-pay

## 2020-04-08 ENCOUNTER — Encounter: Payer: Self-pay | Admitting: Emergency Medicine

## 2020-04-08 DIAGNOSIS — J0300 Acute streptococcal tonsillitis, unspecified: Secondary | ICD-10-CM | POA: Diagnosis not present

## 2020-04-08 DIAGNOSIS — J020 Streptococcal pharyngitis: Secondary | ICD-10-CM

## 2020-04-08 DIAGNOSIS — H9203 Otalgia, bilateral: Secondary | ICD-10-CM | POA: Diagnosis not present

## 2020-04-08 DIAGNOSIS — Z20822 Contact with and (suspected) exposure to covid-19: Secondary | ICD-10-CM | POA: Diagnosis not present

## 2020-04-08 DIAGNOSIS — J039 Acute tonsillitis, unspecified: Secondary | ICD-10-CM

## 2020-04-08 DIAGNOSIS — J029 Acute pharyngitis, unspecified: Secondary | ICD-10-CM | POA: Diagnosis present

## 2020-04-08 LAB — GROUP A STREP BY PCR: Group A Strep by PCR: NOT DETECTED

## 2020-04-08 MED ORDER — AMOXICILLIN 500 MG PO CAPS: 500.0000 mg | ORAL_CAPSULE | Freq: Two times a day (BID) | ORAL | 0 refills | Status: DC

## 2020-04-08 NOTE — ED Triage Notes (Signed)
Pt to ED via POV, c/o awakening this morning with sore throat and bilateral ear pain. States pain has lessened as she has been up and moving. Pt states she thinks she needs an abx. A&O x4, NAD noted at this time.

## 2020-04-08 NOTE — Discharge Instructions (Signed)
Follow discharge care instruction take medication as directed. COVID-19 test results will be available later in the MyChart app. If positive must quarantine additional 10 days per CDC recommendation. If negative advised to get the booster shot.

## 2020-04-08 NOTE — ED Provider Notes (Signed)
[facility] Emergency Department Provider Note   ____________________________________________   Event Date/Time   First MD Initiated Contact with Patient 04/08/20 1342     (approximate)  I have reviewed the triage vital signs and the nursing notes.   HISTORY  Chief Complaint Sore Throat and Otalgia    HPI Sabrina Guerrero is a 75 y.o. female patient presents with 2 days of sore throat and bilateral ear pain.  Patient denies hearing loss.  Patient states throat pain is increased with a.m. awakening.  Patient also states she has lost her "taste buds".  Patient has takenCOVID-19 vaccine shots.  Patient has not taking the booster.  Denies recent travel or known contact with COVID-19.         Past Medical History:  Diagnosis Date  . Diabetes mellitus without complication ([place])   . Gastroesophageal reflux disease   . 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 Memory deficit 05/20/2014  . Partial symptomatic epilepsy with complex partial seizures, not intractable, without status epilepticus ([place]) 05/20/2014  . Ventral hernia     Patient Active Problem List   Diagnosis Date Noted  . History of subdural hematoma (post traumatic) 03/23/2020  . Partial symptomatic epilepsy with complex partial seizures, not intractable, without status epilepticus ([place]) 05/20/2014  . Memory deficit 05/20/2014  . Altered mental status 03/11/2013  . Trapezius muscle spasm 12/03/2012  . Generalized anxiety disorder 12/03/2012  . Hx SBO 11/18/2012  . GERD (gastroesophageal reflux disease) 11/18/2012  . DM w/o complication type II ([place]) 11/18/2012  . Left thyroid nodule 11/18/2012  . 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 TUBAL LIGATION    . VENTRAL HERNIA REPAIR      Prior to Admission medications   Medication Sig Start Date End Date Taking? Authorizing Provider   amoxicillin (AMOXIL) 500 MG capsule Take 1 capsule (500 mg total) by mouth 2 (two) times daily. 04/08/20  Yes Sable Feil, PA-C  acetaminophen (TYLENOL) 500 MG tablet Take 1 tablet (500 mg total) by mouth every 6 (six) hours as needed. 03/05/18   Wurst, [place], PA-C  omeprazole (PRILOSEC) 20 MG capsule Take 2 capsules (40 mg total) by mouth 2 (two) times daily before a meal. 09/24/19   Irene Shipper, MD  OXcarbazepine (TRILEPTAL) 150 MG tablet Take 1 tablet (150 mg total) by mouth 2 (two) times daily. 06/30/19   Suzzanne Cloud, NP    Allergies Keppra [levetiracetam]  Family History  Problem Relation Age of Onset  . Arthritis Mother   . Cancer Brother        lung  . Colon cancer Neg Hx   . Esophageal cancer Neg Hx   . Stomach cancer Neg Hx   . Liver disease Neg Hx     Social History Social History   Tobacco Use  . Smoking status: Never Smoker  . Smokeless tobacco: Never Used  Substance Use Topics  . Alcohol use: No  . Drug use: No    Review of Systems  Constitutional: No fever/chills Eyes: No visual changes. ENT: No sore throat. Cardiovascular: Denies chest pain. Respiratory: Denies shortness of breath. Gastrointestinal: No abdominal pain.  No nausea, no vomiting.  No diarrhea.  No constipation. Genitourinary: Negative for dysuria. Musculoskeletal: Negative for back pain. Skin: Negative for rash. Neurological: Negative for headaches, focal weakness or numbness. Psychiatric:  Generalized anxiety. Endocrine: Diabetes Allergic/Immunilogical: Keppra  ____________________________________________   PHYSICAL EXAM:  VITAL SIGNS: ED Triage Vitals  Enc Vitals Group     BP 04/08/20 1312 123/75     Pulse Rate 04/08/20 1312 79     Resp 04/08/20 1312 18     Temp 04/08/20 1312 97.8 F (36.6 C)     Temp Source 04/08/20 1312 Oral     SpO2 04/08/20 1312 97 %     Weight 04/08/20 1307 161 lb 9.6 oz (73.3 kg)     Height 04/08/20 1307 5\' 2"  (1.575 m)     Head Circumference  --      Peak Flow --      Pain Score 04/08/20 1306 6     Pain Loc --      Pain Edu? --      Excl. in [place]? --    Constitutional: Alert and oriented. Well appearing and in no acute distress. Eyes: Conjunctivae are normal. PERRL. EOMI. Head: Atraumatic. Nose: No congestion/rhinnorhea. Mouth/Throat: Mucous membranes are moist.  Oropharynx non-erythematous.  Generous tonsils.  No visible exudate. Neck: No stridor. Hematological/Lymphatic/Immunilogical: No cervical lymphadenopathy. Cardiovascular: Normal rate, regular rhythm. Grossly normal heart sounds.  Good peripheral circulation. Respiratory: Normal respiratory effort.  No retractions. Lungs CTAB. Gastrointestinal: Soft and nontender. No distention. No abdominal bruits. No CVA tenderness. Genitourinary: Deferred Musculoskeletal: No lower extremity tenderness nor edema.  No joint effusions. Neurologic:  Normal speech and language. No gross focal neurologic deficits are appreciated. No gait instability. Skin:  Skin is warm, dry and intact. No rash noted. Psychiatric: Mood and affect are normal. Speech and behavior are normal.  ____________________________________________   LABS (all labs ordered are listed, but only abnormal results are displayed)  Labs Reviewed  GROUP A STREP BY PCR  SARS CORONAVIRUS 2 (TAT 6-24 HRS)   ____________________________________________  EKG   ____________________________________________  RADIOLOGY Cecilio Asper, personally viewed and evaluated these images (plain radiographs) as part of my medical decision making, as well as reviewing the written report by the radiologist.  ED MD interpretation:  Official radiology report(s): No results found.  ____________________________________________   PROCEDURES  Procedure(s) performed (including Critical Care):  Procedures   ____________________________________________   INITIAL IMPRESSION / ASSESSMENT AND PLAN / ED COURSE  As part of my  medical decision making, I reviewed the following data within the [place]        Patient presents with 2 days of sore throat. Patient also complain of bilateral ear pain. Patient stated pain has improved since arrival. Patient complaint physical exam is consistent with tonsillitis. Patient given discharge care instruction. Patient advised self quarantine pending results of COVID-19 test. If test is positive was quarantine per CDC recommendations. If test is negative advised to get the booster shot. Follow-up with PCP.      ____________________________________________   FINAL CLINICAL IMPRESSION(S) / ED DIAGNOSES  Final diagnoses:  Strep pharyngitis  Tonsillitis     ED Discharge Orders         Ordered    amoxicillin (AMOXIL) 500 MG capsule  2 times daily        04/08/20 1452          *Please note:  Sabrina Guerrero was evaluated in Emergency Department on 04/08/2020 for the symptoms described in the history of present illness. She was evaluated in the context of the global COVID-19 pandemic, which necessitated consideration that the patient might be at risk for infection with the SARS-CoV-2 virus that causes COVID-19. Institutional protocols  and algorithms that pertain to the evaluation of patients at risk for COVID-19 are in a state of rapid change based on information released by regulatory bodies including the CDC and federal and state organizations. These policies and algorithms were followed during the patient's care in the ED.  Some ED evaluations and interventions may be delayed as a result of limited staffing during and the pandemic.*   Note:  This document was prepared using Dragon voice recognition software and may include unintentional dictation errors.    Sable Feil, PA-C 04/08/20 1455    Arta Silence, MD 04/08/20 [phone]

## 2020-04-08 NOTE — ED Notes (Signed)
See triage note  Presents with sore throat this am  States she did have sore slight sore throat yesterday  Unsure of fever at that time afebrile on arrival  Also having some ear pain

## 2020-04-09 LAB — SARS CORONAVIRUS 2 (TAT 6-24 HRS): SARS Coronavirus 2: NEGATIVE

## 2020-04-13 ENCOUNTER — Other Ambulatory Visit: Payer: Self-pay

## 2020-04-13 ENCOUNTER — Encounter: Payer: Self-pay | Admitting: *Deleted

## 2020-04-13 ENCOUNTER — Emergency Department
Admission: EM | Admit: 2020-04-13 | Discharge: 2020-04-13 | Disposition: A | Discharge status: Home / Self Care | Payer: No Typology Code available for payment source | Attending: Emergency Medicine | Admitting: Emergency Medicine

## 2020-04-13 ENCOUNTER — Emergency Department: Payer: No Typology Code available for payment source

## 2020-04-13 DIAGNOSIS — Z5321 Procedure and treatment not carried out due to patient leaving prior to being seen by health care provider: Secondary | ICD-10-CM | POA: Diagnosis not present

## 2020-04-13 DIAGNOSIS — R079 Chest pain, unspecified: Secondary | ICD-10-CM | POA: Insufficient documentation

## 2020-04-13 LAB — CBC
HCT: 37.1 % (ref 36.0–46.0)
Hemoglobin: 12.3 g/dL (ref 12.0–15.0)
MCH: 31.3 pg (ref 26.0–34.0)
MCHC: 33.2 g/dL (ref 30.0–36.0)
MCV: 94.4 fL (ref 80.0–100.0)
Platelets: 229 10*3/uL (ref 150–400)
RBC: 3.93 MIL/uL (ref 3.87–5.11)
RDW: 12.1 % (ref 11.5–15.5)
WBC: 7.8 10*3/uL (ref 4.0–10.5)
nRBC: 0 % (ref 0.0–0.2)

## 2020-04-13 LAB — TROPONIN I (HIGH SENSITIVITY): Troponin I (High Sensitivity): 4 ng/L (ref <18)

## 2020-04-13 LAB — BASIC METABOLIC PANEL
Anion gap: 8 (ref 5–15)
BUN: 10 mg/dL (ref 8–23)
CO2: 26 mmol/L (ref 22–32)
Calcium: 9 mg/dL (ref 8.9–10.3)
Chloride: 99 mmol/L (ref 98–111)
Creatinine, Ser: 0.67 mg/dL (ref 0.44–1.00)
GFR, Estimated: >60 mL/min (ref >60)
Glucose, Bld: 121 mg/dL — ABNORMAL HIGH (ref 70–99)
Potassium: 3.7 mmol/L (ref 3.5–5.1)
Sodium: 133 mmol/L — ABNORMAL LOW (ref 135–145)

## 2020-04-13 NOTE — ED Notes (Signed)
Pt ambulatory to STAT desk, without difficulty or distress noted, accomp by daughter who has re-entered lobby after being told several times to wait outside by registration, previous 1st nurse and security; pt & daughter remain upset over [place] visitation restrictions; explained to both that daughter may accomp pt to an exam room but not remain in lobby; both upset that another pt has someone with them (indicating pt in lobby that is a minor); remain upset despite explanations given; pt is completely A&Ox3 and ambulates independently; pt st that she is leaving despite encouragement to stay and be evaluated and assurance that she would be going to an exam room soon

## 2020-04-13 NOTE — ED Triage Notes (Signed)
Pt reports a chest pain this morning.  Pain was in the center of chest  No pain now.  No sob.  No n/v  No diaphoresis.  Pt alert  Speech clear.

## 2020-04-15 ENCOUNTER — Encounter: Payer: Self-pay | Admitting: Psychology

## 2020-04-21 DIAGNOSIS — Z48811 Encounter for surgical aftercare following surgery on the nervous system: Secondary | ICD-10-CM | POA: Diagnosis not present

## 2020-04-21 DIAGNOSIS — S065X0D Traumatic subdural hemorrhage without loss of consciousness, subsequent encounter: Secondary | ICD-10-CM | POA: Diagnosis not present

## 2020-06-07 ENCOUNTER — Encounter: Payer: No Typology Code available for payment source | Attending: Psychology | Admitting: Psychology

## 2020-06-23 ENCOUNTER — Other Ambulatory Visit: Payer: Self-pay | Admitting: Neurology

## 2020-07-21 ENCOUNTER — Ambulatory Visit: Payer: No Typology Code available for payment source | Admitting: Psychology

## 2020-07-27 ENCOUNTER — Encounter: Payer: No Typology Code available for payment source | Attending: Psychology | Admitting: Psychology

## 2020-07-27 ENCOUNTER — Other Ambulatory Visit: Payer: Self-pay

## 2020-07-27 DIAGNOSIS — F411 Generalized anxiety disorder: Secondary | ICD-10-CM | POA: Diagnosis present

## 2020-07-27 DIAGNOSIS — Z87828 Personal history of other (healed) physical injury and trauma: Secondary | ICD-10-CM | POA: Diagnosis present

## 2020-07-27 DIAGNOSIS — R413 Other amnesia: Secondary | ICD-10-CM | POA: Insufficient documentation

## 2020-08-28 NOTE — Progress Notes (Signed)
Neuropsychological Consultation   Patient:   Sabrina Guerrero   DOB:   May 15, 1945  MR Number:  [id number]  Location:  [facility] [place], [place] [id number] [facility] [place] [address] Dept: [phone]           Date of Service:   07/27/2020  Start Time:   3 PM End Time:   5 PM  Today's visit was an in person visit was conducted in my outpatient clinic office.  1 hour and 15 minutes were spent in formal clinical interview and the other 45 minutes were spent with records review, report writing and setting up testing protocols.  Provider/Observer:  Ilean Skill, Psy.D.       Clinical Neuropsychologist       Billing Code/Service: 96116/[id number]  Chief Complaint:    Sabrina Guerrero is a 75 year old female referred by the patient's PCP Landis Gandy, MD and is also being followed by the [facility].  The patient describes increasing memory difficulties as well as a period of time between 2012 and 2019 that she cannot remember at all.  The patient was admitted to [facility] on 04/05/2019 and was found to have had bilateral subdural hematomas.  The patient was emergently taken to the operating room for bilateral bur holes for subdural hematoma evacuation.  Prior to this event, the patient had had previous falls including one on December 05, 2019 and had been having significant gait instability prior to these falls.  Family felt that she had not been acting completely normal.  The patient did have a prior history of seizure disorder and was followed by Dr. Jannifer Franklin with [facility].  She had had abnormal EEG evaluations but has had no known seizure activity after January 2015 and was well managed on Trileptal twice daily.  Reason for Service:  Sabrina Guerrero is a 75 year old female referred by the patient's PCP Landis Gandy, MD and is  also being followed by the [facility].  The patient describes increasing memory difficulties as well as a period of time between 2012 and 2019 that she cannot remember at all.  The patient was admitted to [facility] on 04/05/2019 and was found to have had bilateral subdural hematomas.  The patient was emergently taken to the operating room for bilateral bur holes for subdural hematoma evacuation.  Prior to this event, the patient had had previous falls including one on December 05, 2019 and had been having significant gait instability prior to these falls.  Family felt that she had not been acting completely normal.  The patient did have a prior history of seizure disorder and was followed by Dr. Jannifer Franklin with [facility].  She had had abnormal EEG evaluations but has had no known seizure activity after January 2015 and was well managed on Trileptal twice daily.  The patient has a past medical history that includes episodes of altered mental status, history of shortness of breath, GERD, type 2 diabetes without complications, generalized anxiety disorder.  History of partial seizures, memory deficits and past history of subdural hematoma.  The patient reports that when she had her significant bleed requiring neurosurgical interventions that they were able to address it quite quickly.  The patient fell but did not tell others but her son came over later and noticed that alterations in the patient and took her directly to the hospital.  She  reports that she fell and struck her head on cement.  The patient describes her memory loss as being somewhat problematic for her.  She reports that she feels like she is remembering less than she used to and does not remember her kids childhood events and other past history.  She reports that she is doing okay with "new stuff" but does feel like she is having some issues with this.  The patient reports having neurosurgical  interventions following her brain bleed and has recovered well but feels like since that time she has been forgetting more things and feels her memory is still improving but will forget where she put things.  The patient describes past events prior to 2012 but then she has a near complete loss of memory for events between 2012 and 2019.  The patient reports that she sleeps well and goes to bed between 9 and 10 PM and wakes up between 2 and 3 AM to go to the bathroom and then is able to go right back to sleep.  She describes a good appetite but feels like her taste buds are "off" but denies this is related to [place] or other issues.  The patient denies any geographic disorientation or visual spatial changes and denies any tremors.  She also denies any changes in motor functioning.  Behavioral Observation: Sabrina Guerrero  presents as a 75 y.o.-year-old Right African American Female who appeared her stated age. her dress was Appropriate and she was Well Groomed and her manners were Appropriate to the situation.  her participation was indicative of Appropriate behaviors.  There were not physical disabilities noted.  she displayed an appropriate level of cooperation and motivation.     Interactions:    Active Appropriate  Attention:   abnormal and attention span appeared shorter than expected for age  Memory:   abnormal; recent memory intact, remote memory impaired  Visuo-spatial:  not examined  Speech (Volume):  normal  Speech:   normal; possibly some mild word finding issues noted during clinical interview.  Thought Process:  Coherent and Relevant  Though Content:  WNL; not suicidal and not homicidal  Orientation:   person, place, time/date, and situation  Judgment:   Good  Planning:   Fair  Affect:    Appropriate  Mood:    Euthymic  Insight:   Good  Intelligence:   normal  Marital Status/Living: The patient was born in [place] but moved to [place] early in her life.  She  was 1 of 5 siblings.  The patient is divorced.  She initially married another gentleman while they were both stationed in [place] in [place].  He went back after couple of years to the [place] and decided he wanted to divorce.  They were married for 10 years.  They have 3 children all of whom are doing well.  Current Employment: The patient is retired.  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  Substance Use:  No concerns of substance abuse are reported.    Education:   The patient completed her undergraduate college degree and had to your graduate program in home economics and was also math major.  She reports that she was always a very good Ship broker.  Medical History:   Past Medical History:  Diagnosis Date   Diabetes mellitus without complication ([place])    Gastroesophageal reflux disease    History of subdural hematoma (post traumatic) 03/23/2020   Bilateral, requiring surgery   Memory deficit 05/20/2014   Partial symptomatic epilepsy with complex partial seizures, not intractable, without status epilepticus ([place]) 05/20/2014   Ventral hernia          Patient Active Problem List   Diagnosis Date Noted   History of subdural hematoma (post traumatic) 03/23/2020   Partial symptomatic epilepsy with complex partial seizures, not intractable, without status epilepticus ([place]) 05/20/2014   Memory deficit 05/20/2014   Altered mental status 03/11/2013   Trapezius muscle spasm 12/03/2012   Generalized anxiety disorder 12/03/2012   Hx SBO 11/18/2012   GERD (gastroesophageal reflux disease) [id number]   DM w/o complication type II ([place]) 11/18/2012   Left thyroid nodule 11/18/2012   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  Family Med/Psych History:  Family History  Problem Relation Age of Onset   Arthritis Mother    Cancer Brother        lung   Colon cancer Neg Hx    Esophageal cancer Neg Hx    Stomach cancer Neg Hx    Liver disease Neg Hx     Impression/DX:  Sabrina Guerrero is a 75 year old female referred by the patient's PCP Landis Gandy, MD and is also being followed by the [facility] [employer].  The patient describes increasing memory difficulties as well as a period of time between 2012 and 2019 that she cannot remember at all.  The patient was admitted to [facility] on 04/05/2019 and was found to have had bilateral subdural hematomas.  The patient was emergently taken to the operating room for bilateral bur holes for subdural hematoma evacuation.  Prior to this event, the patient had had previous falls including one on December 05, 2019 and had been having significant gait instability prior to these falls.  Family felt that she had not been acting completely normal.  The patient did have a prior history of seizure disorder and was followed by Dr. Jannifer Franklin with [facility].  She had had abnormal EEG evaluations but has had no known seizure activity after January 2015 and was well managed on Trileptal twice daily.  Disposition/Plan:  We have set the patient up for formal neuropsychological testing.  We will initially start with a battery of the Wechsler Adult Intelligence Scale-IV as well as the Wechsler Memory Scale's.  Once this foundational battery is completed a determination will be made as to need for any other potential testing beyond this to facilitate differential diagnosis as well as specifically look at treatment recommendations.  Formal report will be produced and provided with recommendations to the patient and to referring physician and be available in her EMR for other treating physicians including Dr.  Jannifer Franklin.  Diagnosis:    History of subdural hematoma (post traumatic)  Memory deficit  Generalized anxiety disorder         Electronically Signed   _______________________ Ilean Skill, Psy.D. Clinical Neuropsychologist

## 2020-09-01 ENCOUNTER — Other Ambulatory Visit: Payer: Self-pay

## 2020-09-01 ENCOUNTER — Encounter: Payer: No Typology Code available for payment source | Attending: Psychology

## 2020-09-01 DIAGNOSIS — Z87828 Personal history of other (healed) physical injury and trauma: Secondary | ICD-10-CM | POA: Diagnosis present

## 2020-09-01 NOTE — Progress Notes (Signed)
Behavioral Observations  The patient appeared well-groomed and appropriately dressed for the testing session. Her manners were polite and appropriate to the situation. The patient demonstrated a positive attitude toward testing and appeared to give her best effort.  Neuropsychology Note  Sabrina Guerrero completed 150 minutes of neuropsychological testing with technician, Sabrina Guerrero, BA, under the supervision of Ilean Skill, PsyD., Clinical Neuropsychologist. The patient did not appear overtly distressed by the testing session, per behavioral observation or via self-report to the technician. Rest breaks were offered.   Clinical Decision Making: In considering the patient's current level of functioning, level of presumed impairment, nature of symptoms, emotional and behavioral responses during clinical interview, level of literacy, and observed level of motivation/effort, a battery of tests was selected by Dr. Sima Matas during initial consultation on 07/27/2020. This was communicated to the technician. Communication between the neuropsychologist and technician was ongoing throughout the testing session and changes were made as deemed necessary based on patient performance on testing, technician observations and additional pertinent factors such as those listed above.  Tests Administered: Wechsler Adult Intelligence Scale, 4th Edition (WAIS-IV) Wechsler Memory Scale, 4th Edition (WMS-IV); Older Adult Battery    Results:  Composite Score Summary  Scale Sum of Scaled Scores Composite Score Percentile Rank 95% Conf. Interval Qualitative Description  Verbal Comprehension 24 VCI 89 23 84-95 Low Average  Perceptual Reasoning 33 PRI 105 63 99-111 Average  Working Memory 14 WMI 83 13 77-91 Low Average  Processing Speed 11 PSI 76 5 70-87 Borderline  Full Scale 82 FSIQ 87 19 83-91 Low Average  General Ability 57 GAI 97 42 92-102 Average     .  Verbal Comprehension Subtests Summary   Subtest Raw Score Scaled Score Percentile Rank Reference Group Scaled Score SEM  Similarities '20 8 25 7 '$ 0.95  Vocabulary 33 9 37 9 0.67  Information '9 7 16 8 '$ 0.73  (Comprehension) '14 6 9 5 '$ 1.27       Perceptual Reasoning Subtests Summary  Subtest Raw Score Scaled Score Percentile Rank Reference Group Scaled Score SEM  Block Design 33 11 63 7 0.99  Matrix Reasoning 16 12 75 8 0.90  Visual Puzzles 11 10 50 7 0.99  (Picture Completion) '6 7 16 5 '$ 0.99       Working Doctor, general practice Raw Score Scaled Score Percentile Rank Reference Group Scaled Score SEM  Digit Span '20 8 25 6 '$ 0.73  Arithmetic '8 6 9 5 '$ 1.20       Processing Speed Subtests Summary  Subtest Raw Score Scaled Score Percentile Rank Reference Group Scaled Score SEM  Symbol Search '10 4 2 2 '$ 1.12  Coding 36 '7 16 4 '$ 1.12      Index Score Summary  Index Sum of Scaled Scores Index Score Percentile Rank 95% Confidence Interval Qualitative Descriptor  Auditory Memory (AMI) 23 75 5 70-82 Borderline  Visual Memory (VMI) 25 113 81 108-118 High Average  Immediate Memory (IMI) 27 93 32 87-100 Average  Delayed Memory (DMI) 21 81 10 75-90 Low Average     Primary Subtest Scaled Score Summary  Subtest Domain Raw Score Scaled Score Percentile Rank  Logical Memory I AM '22 7 16  '$ Logical Memory II AM '7 5 5  '$ Verbal Paired Associates I AM '13 7 16  '$ Verbal Paired Associates II AM '2 4 2  '$ Visual Reproduction I VM 36 13 84  Visual Reproduction II VM 23 12 75  Symbol Span VWM 14 9 37  Auditory Memory Process Score Summary  Process Score Raw Score Scaled Score Percentile Rank Cumulative Percentage (Base Rate)  LM II Recognition 16 - - 17-25%  VPA II Recognition 28 - - 51-75%       Visual Memory Process Score Summary  Process Score Raw Score Scaled Score Percentile Rank Cumulative Percentage (Base Rate)  VR II Recognition 3 - - 17-25%      ABILITY-MEMORY ANALYSIS  Ability Score:  GAI: 97 Date  of Testing:  WAIS-IV; WMS-IV 2020/09/01  Predicted Difference Method   Index Predicted WMS-IV Index Score Actual WMS-IV Index Score Difference Critical Value  Significant Difference Y/N Base Rate  Auditory Memory 98 75 23 10.41 Y 4%  Visual Memory 98 113 -15 7.89 Y 10-15%  Immediate Memory 98 93 5 9.97 N   Delayed Memory 98 81 17 12.33 Y 10%  Statistical significance (critical value) at the .01 level.      Feedback to Patient: Sabrina Guerrero will return on 12/20/2020 for an interactive feedback session with Dr. Sima Matas at which time her test performances, clinical impressions and treatment recommendations will be reviewed in detail. The patient understands she can contact our office should she require our assistance before this time.  150 minutes spent face-to-face with patient administering standardized tests, 30 minutes spent scoring Environmental education officer). [CPT [id number], [id number]  Full report to follow.

## 2020-10-06 ENCOUNTER — Ambulatory Visit (INDEPENDENT_AMBULATORY_CARE_PROVIDER_SITE_OTHER): Payer: Medicare Other | Admitting: Neurology

## 2020-10-06 ENCOUNTER — Encounter: Payer: Self-pay | Admitting: Neurology

## 2020-10-06 VITALS — BP 112/64 | HR 76 | Ht 62.0 in | Wt 165.2 lb

## 2020-10-06 DIAGNOSIS — E538 Deficiency of other specified B group vitamins: Secondary | ICD-10-CM

## 2020-10-06 DIAGNOSIS — R413 Other amnesia: Secondary | ICD-10-CM

## 2020-10-06 DIAGNOSIS — Z5181 Encounter for therapeutic drug level monitoring: Secondary | ICD-10-CM | POA: Diagnosis not present

## 2020-10-06 DIAGNOSIS — G40209 Localization-related (focal) (partial) symptomatic epilepsy and epileptic syndromes with complex partial seizures, not intractable, without status epilepticus: Secondary | ICD-10-CM | POA: Diagnosis not present

## 2020-10-06 DIAGNOSIS — R5382 Chronic fatigue, unspecified: Secondary | ICD-10-CM

## 2020-10-06 MED ORDER — OXCARBAZEPINE 300 MG PO TABS: 300.0000 mg | ORAL_TABLET | Freq: Two times a day (BID) | ORAL | 1 refills | Status: DC

## 2020-10-06 NOTE — Progress Notes (Signed)
Reason for visit: Seizures, memory disturbance  Sabrina Guerrero is an 75 y.o. female  History of present illness:  Sabrina Guerrero is a 75 year old right-handed black female with a history of diabetes and a history of seizures.  The patient has been well controlled on a very low-dose of Trileptal taking 150 mg twice daily for a number of years.  Within the last year, she did have a fall and developed bilateral subdural hematoma requiring surgery.  She had fallen in October 2021, presented to the hospital on 01 February 2020 with gait instability and confusion.  She lives with her daughter who has noted that over the last several months she has sustained 3 witnessed seizure events.  The patient would not remember whether or not she had a seizure if she were alone.  The patient will have a 10 to 15-minute episode of staring off and associated confusion with gradual return to normal baseline.  The patient has developed a memory disorder, she has been set up for formal neuropsychological testing in November 2022.  She is no longer operating a motor vehicle.  She does report occasional episodes of soreness in the head that may come and go, she has had 4-5 such episodes since her surgery for the subdural hematoma.  She returns to this office for an evaluation.  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, requiring surgery   Memory deficit 05/20/2014   Partial symptomatic epilepsy with complex partial seizures, not intractable, without status epilepticus ([place]) 05/20/2014   most recent 09/10/20   Ventral hernia     Past Surgical History:  Procedure Laterality Date   SUBDURAL HEMATOMA EVACUATION VIA CRANIOTOMY  01/2020   TUBAL LIGATION     VENTRAL HERNIA REPAIR      Family History  Problem Relation Age of Onset   Arthritis Mother    Cancer Brother         lung   Colon cancer Neg Hx    Esophageal cancer Neg Hx    Stomach cancer Neg Hx    Liver disease Neg Hx     Social history:  reports that she has never smoked. She has never used smokeless tobacco. She reports that she does not drink alcohol and does not use drugs.    Allergies  Allergen Reactions   Keppra [Levetiracetam]     Malaise    Medications:  Prior to Admission medications   Medication Sig Start Date End Date Taking? Authorizing Provider  acetaminophen (TYLENOL) 500 MG tablet Take 1 tablet (500 mg total) by mouth every 6 (six) hours as needed. 03/05/18   Wurst, [place], PA-C  amoxicillin (AMOXIL) 500 MG capsule Take 1 capsule (500 mg total) by mouth 2 (two) times daily. 04/08/20   Sable Feil, PA-C  omeprazole (PRILOSEC) 20 MG capsule Take 2 capsules (40 mg total) by mouth 2 (two) times daily before a meal. 09/24/19   Irene Shipper, MD  OXcarbazepine (TRILEPTAL) 150 MG tablet TAKE 1 TABLET BY MOUTH TWICE A DAY 06/23/20   Kathrynn Ducking, MD    ROS:  Out of a complete 14 system review of symptoms, the patient complains only of the following symptoms, and all other reviewed systems are negative.  Seizures Memory problems Head soreness  Blood pressure 112/64, pulse 76, height '5\' 2"'$  (1.575 m), weight 165 lb  3.2 oz (74.9 kg).  Physical Exam  General: The patient is alert and cooperative at the time of the examination.  Skin: No significant peripheral edema is noted.   Neurologic Exam  Mental status: The patient is alert and oriented x 3 at the time of the examination. The Mini-Mental status examination done today shows a total score of 25/30.   Cranial nerves: Facial symmetry is present. Speech is normal, no aphasia or dysarthria is noted. Extraocular movements are full. Visual fields are full.  Motor: The patient has good strength in all 4 extremities.  Sensory examination: Soft touch sensation is symmetric on the face, arms, and legs.  Coordination:  The patient has good finger-nose-finger and heel-to-shin bilaterally.  Gait and station: The patient has a normal gait. Tandem gait is slightly unsteady. Romberg is negative. No drift is seen.  Reflexes: Deep tendon reflexes are symmetric.   Assessment/Plan:  1.  History of seizures with recent recurrence  2.  Memory disturbance  The patient last had a seizure on 10 September 2020.  She is not operating motor vehicle at this time.  She will be increased on the Trileptal to 300 mg twice daily.  Blood work will be checked today.  She will be followed for her memory issue.  She is not a good candidate for Aricept, she could potentially go on Namenda in the future.  The patient will be seen in about 4 months, in the future, she can be followed through Dr. April Manson.  Jill Alexanders MD 10/06/2020 9:43 AM  [facility] [address] [place], [place] [address]  Phone [phone] Fax [phone]

## 2020-10-06 NOTE — Patient Instructions (Signed)
We will increase the Trileptal to 300 mg twice a day.  Trileptal (oxcarbazepine) is a seizure medication that is sometimes also used for nerve related pain. As with any seizure medication, this drug may worsen depression. Occasionally, there may be other side effects that include low sodium levels, drowsiness, incoordination, dizziness, headache, nausea, double vision, or a potential allergy involving a skin rash. If you believe that you are having side effects on this medication, please contact our office.

## 2020-10-09 LAB — RPR: RPR Ser Ql: NONREACTIVE

## 2020-10-09 LAB — CBC WITH DIFFERENTIAL/PLATELET
Basophils Absolute: 0 10*3/uL (ref 0.0–0.2)
Basos: 0 %
EOS (ABSOLUTE): 0 10*3/uL (ref 0.0–0.4)
Eos: 0 %
Hematocrit: 38.7 % (ref 34.0–46.6)
Hemoglobin: 13 g/dL (ref 11.1–15.9)
Immature Grans (Abs): 0 10*3/uL (ref 0.0–0.1)
Immature Granulocytes: 0 %
Lymphocytes Absolute: 1.7 10*3/uL (ref 0.7–3.1)
Lymphs: 38 %
MCH: 31.9 pg (ref 26.6–33.0)
MCHC: 33.6 g/dL (ref 31.5–35.7)
MCV: 95 fL (ref 79–97)
Monocytes Absolute: 0.5 10*3/uL (ref 0.1–0.9)
Monocytes: 12 %
Neutrophils Absolute: 2.3 10*3/uL (ref 1.4–7.0)
Neutrophils: 50 %
Platelets: 222 10*3/uL (ref 150–450)
RBC: 4.08 x10E6/uL (ref 3.77–5.28)
RDW: 12.1 % (ref 11.7–15.4)
WBC: 4.6 10*3/uL (ref 3.4–10.8)

## 2020-10-09 LAB — COMPREHENSIVE METABOLIC PANEL
ALT: 9 IU/L (ref 0–32)
AST: 15 IU/L (ref 0–40)
Albumin/Globulin Ratio: 1.3 (ref 1.2–2.2)
Albumin: 4.2 g/dL (ref 3.7–4.7)
Alkaline Phosphatase: 100 IU/L (ref 44–121)
BUN/Creatinine Ratio: 11 — ABNORMAL LOW (ref 12–28)
BUN: 9 mg/dL (ref 8–27)
Bilirubin Total: <0.2 mg/dL (ref 0.0–1.2)
CO2: 23 mmol/L (ref 20–29)
Calcium: 9.6 mg/dL (ref 8.7–10.3)
Chloride: 99 mmol/L (ref 96–106)
Creatinine, Ser: 0.81 mg/dL (ref 0.57–1.00)
Globulin, Total: 3.2 g/dL (ref 1.5–4.5)
Glucose: 121 mg/dL — ABNORMAL HIGH (ref 65–99)
Potassium: 4.3 mmol/L (ref 3.5–5.2)
Sodium: 135 mmol/L (ref 134–144)
Total Protein: 7.4 g/dL (ref 6.0–8.5)
eGFR: 76 mL/min/{1.73_m2} (ref >59)

## 2020-10-09 LAB — TSH: TSH: 1.08 u[IU]/mL (ref 0.450–4.500)

## 2020-10-09 LAB — VITAMIN B12: Vitamin B-12: 510 pg/mL (ref 232–1245)

## 2020-10-09 LAB — 10-HYDROXYCARBAZEPINE: Oxcarbazepine SerPl-Mcnc: 5 ug/mL — ABNORMAL LOW (ref 10–35)

## 2020-10-27 ENCOUNTER — Emergency Department: Payer: No Typology Code available for payment source

## 2020-10-27 ENCOUNTER — Other Ambulatory Visit: Payer: Self-pay

## 2020-10-27 ENCOUNTER — Emergency Department
Admission: EM | Admit: 2020-10-27 | Discharge: 2020-10-28 | Disposition: A | Discharge status: Home / Self Care | Payer: No Typology Code available for payment source | Attending: Emergency Medicine | Admitting: Emergency Medicine

## 2020-10-27 DIAGNOSIS — E871 Hypo-osmolality and hyponatremia: Secondary | ICD-10-CM | POA: Diagnosis not present

## 2020-10-27 DIAGNOSIS — Z7984 Long term (current) use of oral hypoglycemic drugs: Secondary | ICD-10-CM | POA: Diagnosis not present

## 2020-10-27 DIAGNOSIS — R1011 Right upper quadrant pain: Secondary | ICD-10-CM | POA: Diagnosis not present

## 2020-10-27 DIAGNOSIS — R0789 Other chest pain: Secondary | ICD-10-CM | POA: Diagnosis not present

## 2020-10-27 DIAGNOSIS — K219 Gastro-esophageal reflux disease without esophagitis: Secondary | ICD-10-CM | POA: Diagnosis not present

## 2020-10-27 DIAGNOSIS — E119 Type 2 diabetes mellitus without complications: Secondary | ICD-10-CM | POA: Insufficient documentation

## 2020-10-27 DIAGNOSIS — R1010 Upper abdominal pain, unspecified: Secondary | ICD-10-CM | POA: Diagnosis not present

## 2020-10-27 LAB — CBC
HCT: 37.6 % (ref 36.0–46.0)
Hemoglobin: 13.6 g/dL (ref 12.0–15.0)
MCH: 34 pg (ref 26.0–34.0)
MCHC: 36.2 g/dL — ABNORMAL HIGH (ref 30.0–36.0)
MCV: 94 fL (ref 80.0–100.0)
Platelets: 211 10*3/uL (ref 150–400)
RBC: 4 MIL/uL (ref 3.87–5.11)
RDW: 11.9 % (ref 11.5–15.5)
WBC: 5.2 10*3/uL (ref 4.0–10.5)
nRBC: 0 % (ref 0.0–0.2)

## 2020-10-27 LAB — BASIC METABOLIC PANEL
Anion gap: 8 (ref 5–15)
BUN: 7 mg/dL — ABNORMAL LOW (ref 8–23)
CO2: 27 mmol/L (ref 22–32)
Calcium: 9.4 mg/dL (ref 8.9–10.3)
Chloride: 91 mmol/L — ABNORMAL LOW (ref 98–111)
Creatinine, Ser: 0.6 mg/dL (ref 0.44–1.00)
GFR, Estimated: >60 mL/min (ref >60)
Glucose, Bld: 134 mg/dL — ABNORMAL HIGH (ref 70–99)
Potassium: 4.5 mmol/L (ref 3.5–5.1)
Sodium: 126 mmol/L — ABNORMAL LOW (ref 135–145)

## 2020-10-27 LAB — LIPASE, BLOOD: Lipase: 28 U/L (ref 11–51)

## 2020-10-27 LAB — HEPATIC FUNCTION PANEL
ALT: 11 U/L (ref 0–44)
AST: 16 U/L (ref 15–41)
Albumin: 4 g/dL (ref 3.5–5.0)
Alkaline Phosphatase: 84 U/L (ref 38–126)
Bilirubin, Direct: <0.1 mg/dL (ref 0.0–0.2)
Total Bilirubin: 0.6 mg/dL (ref 0.3–1.2)
Total Protein: 8.3 g/dL — ABNORMAL HIGH (ref 6.5–8.1)

## 2020-10-27 LAB — TROPONIN I (HIGH SENSITIVITY)
Troponin I (High Sensitivity): 3 ng/L (ref <18)
Troponin I (High Sensitivity): 4 ng/L (ref <18)

## 2020-10-27 MED ORDER — SUCRALFATE 1 G PO TABS: 1.0000 g | ORAL_TABLET | Freq: Four times a day (QID) | ORAL | 1 refills | Status: DC | PRN

## 2020-10-27 NOTE — ED Triage Notes (Signed)
Pt to ED via POV from home with daughter. Pt c/o sharp central CP that radiates to the back x3days. Pt has hx stomach ulcer. Pt also c/o constipation. Pt hx DM and seizures. Pt denies any medication changes.

## 2020-10-27 NOTE — Discharge Instructions (Addendum)
As we discussed, your work-up was generally reassuring.  Your sodium was a little bit low, and this is probably from not eating or drinking all that well over the last few days.  Try drinking some Gatorade or Powerade and eating plenty of food with a normal or even higher than usual sodium content over the next few days.  Do not drink much water by itself.  This will likely correct over time, but you can follow-up with your primary care doctor next week to have repeat labs drawn to see how you are doing.  Your lab work otherwise was reassuring and your ultrasound was normal, showing no evidence of problems with your gallbladder.  We sent a message through the computer system to Dr. Vicente Males who is a GI doctor and ask him to have his staff reach out to you to schedule follow-up appointment.  If you have not heard from them by early next week, you can call the office at the number provided.    Return to the emergency department if you develop new or worsening symptoms that concern you.

## 2020-10-27 NOTE — ED Provider Notes (Signed)
[facility] Emergency Department Provider Note  ____________________________________________   Event Date/Time   First MD Initiated Contact with Patient 10/27/20 2322     (approximate)  I have reviewed the triage vital signs and the nursing notes.   HISTORY  Chief Complaint Chest Pain (R/t back )    HPI Sabrina Guerrero is a 75 y.o. female with medical history as listed below who presents for evaluation of episodic pain in her upper abdomen, chest, and back over the last 4 to 5 days.  It seems to always occur after she eats or drinks something.  The pain can be sharp and aching, usually moderate to severe, and feels like it goes through from her upper abdomen and middle to lower chest through to her back.  She says she feels like food is going from her mouth into her back rather than into her stomach.  Perhaps a bit of nausea but no vomiting.  She has been constipated for a few days as well but then had a good bowel movement yesterday but it did not stop the episodic discomfort.  She has had no shortness of breath.  She denies fever, sore throat, lower abdominal pain, and dysuria.  She has had no episodes of dizziness or lightheadedness.  She has had no focal numbness nor weakness in her extremities.  The patient is currently pain-free.  She said it last occurred after drinking some grape juice just prior to coming to the emergency department.  She has been pain-free for about 6 hours.  The patient's daughter initially reported in triage that the patient had a history of stomach ulcers, but she thinks actually what she was diagnosed with was a hiatal hernia.  Perhaps 40 years ago the patient had some procedure on her stomach but they are not sure if it was to close an ulcer, but the more she thought about it, the more she believes it was for a ventral hernia (of note, ventral hernia repair is documented in the electronic medical record).  The patient takes omeprazole  40 mg twice daily.  No recent medication changes.     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, requiring surgery   Memory deficit 05/20/2014   Partial symptomatic epilepsy with complex partial seizures, not intractable, without status epilepticus ([place]) 05/20/2014   most recent 09/10/20   Ventral hernia     Patient Active Problem List   Diagnosis Date Noted   History of subdural hematoma (post traumatic) 03/23/2020   Partial symptomatic epilepsy with complex partial seizures, not intractable, without status epilepticus ([place]) 05/20/2014   Memory deficit 05/20/2014   Altered mental status 03/11/2013   Trapezius muscle spasm 12/03/2012   Generalized anxiety disorder 12/03/2012   Hx SBO 11/18/2012   GERD (gastroesophageal reflux disease) 57/26/2035   DM w/o complication type II ([place]) 11/18/2012   Left thyroid nodule 11/18/2012   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 Authorizing Provider  atorvastatin (LIPITOR) 20 MG tablet Take 20 mg by mouth daily. 09/30/20  Yes [provider]  Cholecalciferol 50 MCG (2000  UT) TABS Take 1 tablet by mouth 2 (two) times daily. 09/30/20  Yes [provider]  metFORMIN (GLUCOPHAGE-XR) 500 MG 24 hr tablet Take 500 mg by mouth 2 (two) times daily. 09/30/20  Yes [provider]  omeprazole (PRILOSEC) 20 MG capsule Take 2 capsules (40 mg total) by mouth 2 (two) times daily before a meal. 09/24/19  Yes Irene Shipper, MD  Oxcarbazepine (TRILEPTAL) 300 MG tablet Take 1 tablet (300 mg total) by mouth 2 (two) times daily. 10/06/20  Yes Kathrynn Ducking, MD   sucralfate (CARAFATE) 1 g tablet Take 1 tablet (1 g total) by mouth 4 (four) times daily as needed (for abdominal discomfort, nausea, and/or vomiting). 10/27/20  Yes Hinda Kehr, MD  thiamine 100 MG tablet Take 1 tablet by mouth daily. Patient not taking: No sig reported 04/11/20   [provider]    Allergies Patient has no known allergies.  Family History  Problem Relation Age of Onset   Arthritis Mother    Cancer Brother        lung   Colon cancer Neg Hx    Esophageal cancer Neg Hx    Stomach cancer Neg Hx    Liver disease Neg Hx     Social History Social History   Tobacco Use   Smoking status: Never   Smokeless tobacco: Never  Substance Use Topics   Alcohol use: No   Drug use: No    Review of Systems Constitutional: No fever/chills Eyes: No visual changes. ENT: No sore throat. Cardiovascular: Pain in the chest and upper abdomen as described above. Respiratory: Denies shortness of breath. Gastrointestinal: Episodic upper abdominal and chest discomfort that radiates through to the back with occasional nausea, no lower abdominal pain, no vomiting.  Some constipation but improved as of yesterday. Genitourinary: Negative for dysuria. Musculoskeletal: Negative for neck pain.  Negative for back pain. Integumentary: Negative for rash. Neurological: Negative for headaches, focal weakness or numbness.   ____________________________________________   PHYSICAL EXAM:  VITAL SIGNS: ED Triage Vitals  Enc Vitals Group     BP 10/27/20 1844 (!) 189/96     Pulse Rate 10/27/20 1844 64     Resp 10/27/20 1844 18     Temp 10/27/20 1844 98.5 F (36.9 C)     Temp Source 10/27/20 1844 Oral     SpO2 10/27/20 1844 99 %     Weight 10/27/20 1840 77.1 kg (170 lb)     Height 10/27/20 1840 1.575 m (5\' 2" )     Head Circumference --      Peak Flow --      Pain Score 10/27/20 1839 7     Pain Loc --      Pain Edu? --      Excl. in [place]? --     Constitutional: Alert and  oriented.  Eyes: Conjunctivae are normal.  Head: Atraumatic. Nose: No congestion/rhinnorhea. Mouth/Throat: Patient is wearing a mask. Neck: No stridor.  No meningeal signs.   Cardiovascular: Normal rate, regular rhythm. Good peripheral circulation. Respiratory: Normal respiratory effort.  No retractions. Gastrointestinal: Soft and nontender with no rebound and no guarding.  Negative Murphy sign. Musculoskeletal: No lower extremity tenderness nor edema. No gross deformities of extremities. Neurologic:  Normal speech and language. No gross focal neurologic deficits are appreciated.  Skin:  Skin is warm, dry and intact. Psychiatric: Mood and affect are normal. Speech and behavior are normal.  ____________________________________________   LABS (all labs ordered are listed, but only  abnormal results are displayed)  Labs Reviewed  BASIC METABOLIC PANEL - Abnormal; Notable for the following components:      Result Value   Sodium 126 (*)    Chloride 91 (*)    Glucose, Bld 134 (*)    BUN 7 (*)    All other components within normal limits  CBC - Abnormal; Notable for the following components:   MCHC 36.2 (*)    All other components within normal limits  HEPATIC FUNCTION PANEL - Abnormal; Notable for the following components:   Total Protein 8.3 (*)    All other components within normal limits  LIPASE, BLOOD  TROPONIN I (HIGH SENSITIVITY)  TROPONIN I (HIGH SENSITIVITY)   ____________________________________________  EKG  ED ECG REPORT I, Hinda Kehr, the attending physician, personally viewed and interpreted this ECG.  Date: 10/26/2020 EKG Time: 18: 42 Rate: 62 Rhythm: normal sinus rhythm QRS Axis: Left axis deviation Intervals: Right bundle branch block ST/T Wave abnormalities: Non-specific ST segment / T-wave changes, but no clear evidence of acute ischemia. Narrative Interpretation: no definitive evidence of acute ischemia; does not meet STEMI  criteria.  ____________________________________________  RADIOLOGY I, Hinda Kehr, personally viewed and evaluated these images (plain radiographs) as part of my medical decision making, as well as reviewing the written report by the radiologist.  ED MD interpretation: Acute abnormalities on chest x-ray.  Ultrasound of the right upper quadrant shows no acute abnormalities.  Official radiology report(s): DG Chest 2 View  Result Date: 10/27/2020 CLINICAL DATA:  Chest pain for several days, initial encounter EXAM: CHEST - 2 VIEW COMPARISON:  11/03/2013 FINDINGS: Cardiac shadow is within normal limits. The lungs are well aerated bilaterally. No focal infiltrate or sizable effusion is seen. No bony abnormality is noted. IMPRESSION: No active cardiopulmonary disease. Electronically Signed   By: Inez Catalina M.D.   On: 10/27/2020 19:17   US ABDOMEN LIMITED RUQ (LIVER/GB)  Result Date: 10/28/2020 CLINICAL DATA:  Episodic upper abdominal and back pain for 3 days. EXAM: ULTRASOUND ABDOMEN LIMITED RIGHT UPPER QUADRANT COMPARISON:  06/30/2019 FINDINGS: Gallbladder: Gallbladder is moderately contracted. Patient is nonfasting. No gallstones, gallbladder wall thickening, sludge, or edema. Murphy's sign is negative. Common bile duct: Diameter: 2 mm, normal Liver: No focal lesion identified. Within normal limits in parenchymal echogenicity. Portal vein is patent on color Doppler imaging with normal direction of blood flow towards the liver. Other: None. IMPRESSION: Contracted gallbladder is likely physiologic in a nonfasting patient. No evidence of cholelithiasis or cholecystitis. Electronically Signed   By: Lucienne Capers M.D.   On: 10/28/2020 00:38    ____________________________________________   INITIAL IMPRESSION / MDM / ASSESSMENT AND PLAN / ED COURSE  As part of my medical decision making, I reviewed the following data within the [place] History obtained from family, Nursing notes  reviewed and incorporated, Labs reviewed , EKG interpreted , Old chart reviewed, and Notes from prior ED visits   Differential diagnosis includes, but is not limited to, GERD, gastritis NOS, hiatal hernia discomfort, ulcer, biliary colic, less likely AAS.  Very unlikely to be the result of ACS or PE.  Patient is well-appearing and in no distress and has had no symptoms for about 6 hours.  Vital signs are stable other than hypertension and she has been in the ED for about 6 and half hours and has not taken her evening medication.  She is in good spirits and told me as soon as I saw her that she does not  plan to stay.  Very unlikely to represent an emergent medical condition at this time.  She has no tenderness to palpation of the abdomen and she has no sign of ischemia on EKG.  I personally reviewed the patient's imaging and agree with the radiologist's interpretation that there are no acute abnormalities on chest x-ray.  Basic metabolic panel is generally reassuring other than a slightly decreased sodium of 126.  I suspect this is volume related since she has been eating and drinking a little bit less than usual but she has no signs of any CNS effects from her decreased sodium including no lethargy, weakness, fatigue, etc.  She is able to take oral intake and she feels reassured after our initial discussion.  I offered to have an IV placed and give her a fluid bolus but she prefers to just take sufficient intake after she goes home including electrolyte solutions and foods with sodium and I think that is reasonable and appropriate.  Plan is to add on hepatic function panel and lipase and to obtain a right upper quadrant ultrasound to look for gallstones.  I anticipate discharge and the patient and her daughter agree with the current plan.     Clinical Course as of 10/28/20 0106  Thu Oct 27, 2020  2355 Lipase: 28 [CF]  2355 Hepatic function panel(!) Normal hepatic function panel and lipase which is  reassuring [CF]    Clinical Course User Index [CF] Hinda Kehr, MD     ____________________________________________  FINAL CLINICAL IMPRESSION(S) / ED DIAGNOSES  Final diagnoses:  Upper abdominal pain  Atypical chest pain  Hyponatremia     MEDICATIONS GIVEN DURING THIS VISIT:  Medications - No data to display   ED Discharge Orders          Ordered    sucralfate (CARAFATE) 1 g tablet  4 times daily PRN        10/27/20 2357             Note:  This document was prepared using Dragon voice recognition software and may include unintentional dictation errors.   Hinda Kehr, MD 10/28/20 [phone]

## 2020-10-27 NOTE — ED Notes (Signed)
ED Provider at bedside. 

## 2020-10-27 NOTE — ED Provider Notes (Signed)
Emergency Medicine Provider Triage Evaluation Note  Sabrina Guerrero , a 75 y.o. female  was evaluated in triage.  Pt complains of sharp chest pain radiating into the back.  This been ongoing x3 days.  It is constant but worse with eating.  Patient denies any abdominal pain.  No shortness of breath.  Pain does not radiate into the neck or down the left arm.  No cardiac history but patient does have a history of a previous stomach ulcer.  This was "decades ago" but pain is similar.  Patient is reporting constipation.  No dysuria, polyuria, hematuria.  No radicular symptoms in the upper or lower extremities..  Review of Systems  Positive: Sharp chest pain radiating into the back.  Positive for constipation. Negative: No shortness of breath, emesis, diarrhea, dysuria, polyuria or hematuria  Physical Exam  BP (!) 189/96 (BP Location: Left Arm)   Pulse 64   Temp 98.5 F (36.9 C) (Oral)   Resp 18   Ht 5\' 2"  (1.575 m)   Wt 77.1 kg   SpO2 99%   BMI 31.09 kg/m  Gen:   Awake, no distress   Resp:  Normal effort  MSK:   Moves extremities without difficulty  Other:  Palpation over the abdomen reveals no tenderness.  Palpation of the ribs reveals no tenderness.  Normal S1 and S2 with no murmurs, rubs or gallops.  Medical Decision Making  Medically screening exam initiated at 6:57 PM.  Appropriate orders placed.  Sabrina Guerrero was informed that the remainder of the evaluation will be completed by another provider, this initial triage assessment does not replace that evaluation, and the importance of remaining in the ED until their evaluation is complete.  Patient presents with sharp chest pain radiating to her back.  Patient will have labs, EKG, chest x-ray.  Differential includes peptic ulcer disease, cholecystitis, pancreatitis, AAA, dissection.  Patient may require further imaging based off of initial work-up.   Sabrina Guerrero 10/27/20 1857    Sabrina Divine, MD 10/28/20 2245

## 2020-11-11 ENCOUNTER — Other Ambulatory Visit: Payer: Self-pay

## 2020-11-11 DIAGNOSIS — E669 Obesity, unspecified: Secondary | ICD-10-CM | POA: Insufficient documentation

## 2020-11-11 DIAGNOSIS — K449 Diaphragmatic hernia without obstruction or gangrene: Secondary | ICD-10-CM | POA: Insufficient documentation

## 2020-11-11 DIAGNOSIS — G40909 Epilepsy, unspecified, not intractable, without status epilepticus: Secondary | ICD-10-CM | POA: Insufficient documentation

## 2020-11-11 DIAGNOSIS — Z8679 Personal history of other diseases of the circulatory system: Secondary | ICD-10-CM | POA: Insufficient documentation

## 2020-11-11 DIAGNOSIS — E785 Hyperlipidemia, unspecified: Secondary | ICD-10-CM | POA: Insufficient documentation

## 2020-11-11 DIAGNOSIS — J029 Acute pharyngitis, unspecified: Secondary | ICD-10-CM | POA: Insufficient documentation

## 2020-11-11 DIAGNOSIS — R109 Unspecified abdominal pain: Secondary | ICD-10-CM | POA: Insufficient documentation

## 2020-11-11 DIAGNOSIS — E1169 Type 2 diabetes mellitus with other specified complication: Secondary | ICD-10-CM | POA: Insufficient documentation

## 2020-11-11 DIAGNOSIS — M546 Pain in thoracic spine: Secondary | ICD-10-CM | POA: Insufficient documentation

## 2020-11-11 DIAGNOSIS — A048 Other specified bacterial intestinal infections: Secondary | ICD-10-CM | POA: Insufficient documentation

## 2020-11-11 DIAGNOSIS — I10 Essential (primary) hypertension: Secondary | ICD-10-CM | POA: Insufficient documentation

## 2020-11-11 HISTORY — DX: Unspecified abdominal pain: R10.9

## 2020-11-17 ENCOUNTER — Ambulatory Visit (INDEPENDENT_AMBULATORY_CARE_PROVIDER_SITE_OTHER): Payer: No Typology Code available for payment source | Admitting: Gastroenterology

## 2020-11-17 ENCOUNTER — Encounter: Payer: Self-pay | Admitting: Gastroenterology

## 2020-11-17 ENCOUNTER — Other Ambulatory Visit: Payer: Self-pay

## 2020-11-17 VITALS — BP 150/82 | HR 64 | Temp 98.4°F | Ht 62.0 in | Wt 165.6 lb

## 2020-11-17 DIAGNOSIS — Z1211 Encounter for screening for malignant neoplasm of colon: Secondary | ICD-10-CM

## 2020-11-17 DIAGNOSIS — R1319 Other dysphagia: Secondary | ICD-10-CM | POA: Diagnosis not present

## 2020-11-17 DIAGNOSIS — K5900 Constipation, unspecified: Secondary | ICD-10-CM

## 2020-11-17 MED ORDER — NA SULFATE-K SULFATE-MG SULF 17.5-3.13-1.6 GM/177ML PO SOLN: 354.0000 mL | Freq: Once | ORAL | 0 refills | Status: AC

## 2020-11-17 NOTE — Patient Instructions (Signed)
Polyethylene Glycol Powder What is this medication? POLYETHYLENE GLYCOL (pol ee ETH i leen; GLYE col) prevents and treats occasional constipation. It works by softening the stool, making it easier to have a bowel movement. It belongs to a group of medications called laxatives. This medicine may be used for other purposes; ask your health care provider or pharmacist if you have questions. COMMON BRAND NAME(S): GaviLax, GIALAX, GlycoLax, Healthylax, MiraLax, Smooth LAX, Vita Health What should I tell my care team before I take this medication? They need to know if you have any of these conditions: History of blockage in your bowels Nausea Phenylketonuria Stomach or intestine problem Stomach pain Sudden change in bowel habit lasting more than 2 weeks Vomiting An unusual or allergic reaction to polyethylene glycol (PEG), other medications, foods, dyes, or preservatives Pregnant or trying to get pregnant Breast-feeding How should I use this medication? Take this medication by mouth. Take it as directed on the label. Add the right dose to 4 to 8 ounces or 120 to 240 mL of water, juice, soda, coffee or tea. Do not mix this medication with foods or other liquids. Do not combine with starch-based thickeners (e.g., flour, cornstarch, arrowroot, tapioca, xanthan gum). Mix well. Drink the solution. Do not use it more often than directed. Talk to your care team about the use of this medication in children. While it may be given to children as young as 16 years for selected conditions, precautions do apply. Overdosage: If you think you have taken too much of this medicine contact a poison control center or emergency room at once. NOTE: This medicine is only for you. Do not share this medicine with others. What if I miss a dose? If you miss a dose, take it as soon as you can. If it is almost time for your next dose, take only that dose. Do not take double or extra doses. What may interact with this  medication? Interactions are not expected. This list may not describe all possible interactions. Give your health care provider a list of all the medicines, herbs, non-prescription drugs, or dietary supplements you use. Also tell them if you smoke, drink alcohol, or use illegal drugs. Some items may interact with your medicine. What should I watch for while using this medication? Do not use for more than one week without advice from your care team. If your constipation returns, check with your care team. Drink plenty of water while taking this medication. Drinking water helps decrease constipation. Stop using this medication and contact your care team if you experience any rectal bleeding or do not have a bowel movement after use. These could be signs of a more serious condition. What side effects may I notice from receiving this medication? Side effects that you should report to your care team as soon as possible: Allergic reactions-skin rash, itching, hives, swelling of the face, lips, tongue, or throat Side effects that usually do not require medical attention (report to your care team if they continue or are bothersome): Bloating Gas Nausea Stomach cramping This list may not describe all possible side effects. Call your doctor for medical advice about side effects. You may report side effects to FDA at 1-800-FDA-1088. Where should I keep my medication? Keep out of the reach of children and pets. Store at room temperature between 20 and 25 degrees C (68 and 77 degrees F). Get rid of any unused medication after the expiration date. To get rid of medications that are no longer needed or  have expired: Take the medication to a medication take-back program. Check with your pharmacy or law enforcement to find a location. If you cannot return the medication, check the label or package insert to see if the medication should be thrown out in the garbage or flushed down the toilet. If you are not sure,  ask your care team. If it is safe to put it in the trash, pour the medication out of the container. Mix the medication with cat litter, dirt, coffee grounds, or other unwanted substance. Seal the mixture in a bag or container. Put it in the trash. NOTE: This sheet is a summary. It may not cover all possible information. If you have questions about this medicine, talk to your doctor, pharmacist, or health care provider.  2022 Elsevier/Gold Standard (2020-04-27 14:13:02)

## 2020-11-17 NOTE — Progress Notes (Signed)
Jonathon Bellows MD, MRCP(U.K) [address]  [place]  [place], [place] [address]  Main: [phone]  Fax: [phone]   Gastroenterology Consultation  Referring Provider:     Cheyenne Adas, MD Primary Care Physician:  Cheyenne Adas, MD Primary Gastroenterologist:  Dr. Jonathon Bellows  Reason for Consultation:     Abdominal pain        HPI:   Sabrina Guerrero is a 75 y.o. y/o female referred for consultation & management  by Dr. Lendon Ka, Beryle Lathe, MD.     She is here today to see me for abdominal pain she was previously seen by Dr. Henrene Pastor for abdominal pain back in May 2021.  At that time she was noted to have GERD, issues with gas, had dysphagia was on omeprazole 40 mg twice a day with resolution of symptoms plan was to do an upper endoscopy and colonoscopy.  I do not see any report of any of these procedures.  She came into the ER on 10/27/2020 for upper abdominal pain related to food intake sharp in nature she had been on omeprazole 40 mg twice a day labs were performed with a normal lipase, hepatic function and CBC.  Creatinine was 0.6.  She says for a few months she has had issues with swallowing and then food gets stuck in her food pipe and does not feel like it is going to the stomach not very precise with the description.  Denies any clear weight loss over the recent time.  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  Had some issues with gas and bloating.  She states she usually has a bowel movement every day but sometimes does not have 1.  Never had a colonoscopy he takes a Prilosec 40 mg twice a day initially stated that she did not take it before meals but later she did mention that she usually daily before meals.  She eats that she has had a hiatal hernia in the past before  Past Medical History:  Diagnosis Date  . Diabetes mellitus without complication ([place])   . Gastroesophageal reflux disease   . 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 Memory deficit 05/20/2014  . Partial symptomatic epilepsy with complex partial seizures, not intractable, without status epilepticus ([place]) 05/20/2014   most recent 09/10/20  . Ventral hernia     Past Surgical History:  Procedure Laterality Date  . SUBDURAL HEMATOMA EVACUATION VIA CRANIOTOMY  01/2020  . TUBAL LIGATION    . VENTRAL HERNIA REPAIR      Prior to Admission medications   Medication Sig Start Date End Date Taking? Authorizing Provider  atorvastatin (LIPITOR) 20 MG tablet Take 20 mg by mouth daily. 09/30/20   [provider]  Cholecalciferol 50 MCG (2000 UT) TABS Take 1 tablet by mouth 2 (two) times daily. 09/30/20   [provider]  metFORMIN (GLUCOPHAGE-XR) 500 MG 24 hr tablet Take 500 mg by mouth 2 (two) times daily. 09/30/20   [provider]  omeprazole (PRILOSEC) 20 MG capsule Take 2 capsules (40 mg total) by mouth 2 (two) times daily before a meal. 09/24/19   Irene Shipper, MD  sucralfate (CARAFATE) 1 g tablet Take 1 tablet (1 g total) by mouth 4 (four) times daily as needed (for abdominal discomfort, nausea, and/or vomiting). 10/27/20   Hinda Kehr, MD  thiamine 100 MG tablet Take 1 tablet by mouth daily. Patient not taking: No  sig reported 04/11/20   [provider]    Family History  Problem Relation Age of Onset  . Arthritis Mother   . Cancer Brother        lung  . Colon cancer Neg Hx   . Esophageal cancer Neg Hx   . Stomach cancer Neg Hx   . Liver disease Neg Hx      Social History   Tobacco Use  . Smoking status: Never  . Smokeless tobacco: Never  Substance Use Topics  . Alcohol use: No  . Drug use: No    Allergies as of 11/17/2020  . (No Known Allergies)    Review of Systems:    All systems reviewed and negative except where noted in HPI.   Physical Exam:  BP (!) 150/82   Pulse 64   Temp 98.4 F (36.9 C) (Oral)   Ht 5\' 2"  (1.575 m)   Wt 165 lb 9.6 oz (75.1 kg)   BMI 30.29  kg/m  No LMP recorded. Patient is postmenopausal. Psych:  Alert and cooperative. Normal mood and affect. General:   Alert,  Well-developed, well-nourished, pleasant and cooperative in NAD Head:  Normocephalic and atraumatic. Eyes:  Sclera clear, no icterus.   Conjunctiva pink. Ears:  Normal auditory acuity. Lungs:  Respirations even and unlabored.  Clear throughout to auscultation.   No wheezes, crackles, or rhonchi. No acute distress. Heart:  Regular rate and rhythm; no murmurs, clicks, rubs, or gallops. Abdomen:  Normal bowel sounds.  No bruits.  Soft, non-tender and non-distended without masses, hepatosplenomegaly or hernias noted.  No guarding or rebound tenderness.    Neurologic:  Alert and oriented x3;  grossly normal neurologically. Psych:  Alert and cooperative. Normal mood and affect.  Imaging Studies: DG Chest 2 View  Result Date: 10/27/2020 CLINICAL DATA:  Chest pain for several days, initial encounter EXAM: CHEST - 2 VIEW COMPARISON:  11/03/2013 FINDINGS: Cardiac shadow is within normal limits. The lungs are well aerated bilaterally. No focal infiltrate or sizable effusion is seen. No bony abnormality is noted. IMPRESSION: No active cardiopulmonary disease. Electronically Signed   By: Inez Catalina M.D.   On: 10/27/2020 19:17   US ABDOMEN LIMITED RUQ (LIVER/GB)  Result Date: 10/28/2020 CLINICAL DATA:  Episodic upper abdominal and back pain for 3 days. EXAM: ULTRASOUND ABDOMEN LIMITED RIGHT UPPER QUADRANT COMPARISON:  06/30/2019 FINDINGS: Gallbladder: Gallbladder is moderately contracted. Patient is nonfasting. No gallstones, gallbladder wall thickening, sludge, or edema. Murphy's sign is negative. Common bile duct: Diameter: 2 mm, normal Liver: No focal lesion identified. Within normal limits in parenchymal echogenicity. Portal vein is patent on color Doppler imaging with normal direction of blood flow towards the liver. Other: None. IMPRESSION: Contracted gallbladder is likely  physiologic in a nonfasting patient. No evidence of cholelithiasis or cholecystitis. Electronically Signed   By: Lucienne Capers M.D.   On: 10/28/2020 00:38    Assessment and Plan:   Sabrina Guerrero is a 75 y.o. y/o female has been referred for abdominal pain.  Her symptoms are of dysphagia.  She has a hiatal hernia and may have an esophageal stricture.  She has never had a screening colonoscopy.  She may have a degree of constipation based on her history and I suggested her to commence on MiraLAX 1 capful daily.  We will proceed with an EGD and colonoscopy.   I have discussed alternative options, risks & benefits,  which include, but are not limited to, bleeding, infection, perforation,respiratory complication & drug  reaction.  The patient agrees with this plan & written consent will be obtained.     Follow up in 3 months  Dr Jonathon Bellows MD,MRCP(U.K)

## 2020-12-08 ENCOUNTER — Ambulatory Visit: Payer: Medicare Other | Admitting: Registered Nurse

## 2020-12-08 ENCOUNTER — Other Ambulatory Visit: Payer: Self-pay

## 2020-12-08 ENCOUNTER — Ambulatory Visit
Admission: RE | Admit: 2020-12-08 | Discharge: 2020-12-08 | Disposition: A | Discharge status: Home / Self Care | Payer: Medicare Other | Source: Ambulatory Visit | Attending: Gastroenterology | Admitting: Gastroenterology

## 2020-12-08 ENCOUNTER — Encounter
Admission: RE | Disposition: A | Discharge status: Home / Self Care | Payer: Self-pay | Source: Ambulatory Visit | Attending: Gastroenterology

## 2020-12-08 DIAGNOSIS — Z79899 Other long term (current) drug therapy: Secondary | ICD-10-CM | POA: Insufficient documentation

## 2020-12-08 DIAGNOSIS — K635 Polyp of colon: Secondary | ICD-10-CM | POA: Diagnosis not present

## 2020-12-08 DIAGNOSIS — E785 Hyperlipidemia, unspecified: Secondary | ICD-10-CM | POA: Diagnosis not present

## 2020-12-08 DIAGNOSIS — Z1211 Encounter for screening for malignant neoplasm of colon: Secondary | ICD-10-CM | POA: Insufficient documentation

## 2020-12-08 DIAGNOSIS — R1319 Other dysphagia: Secondary | ICD-10-CM

## 2020-12-08 DIAGNOSIS — D122 Benign neoplasm of ascending colon: Secondary | ICD-10-CM | POA: Diagnosis not present

## 2020-12-08 DIAGNOSIS — K5730 Diverticulosis of large intestine without perforation or abscess without bleeding: Secondary | ICD-10-CM | POA: Diagnosis not present

## 2020-12-08 DIAGNOSIS — R1310 Dysphagia, unspecified: Secondary | ICD-10-CM | POA: Insufficient documentation

## 2020-12-08 HISTORY — PX: COLONOSCOPY WITH PROPOFOL: SHX5780

## 2020-12-08 HISTORY — PX: ESOPHAGOGASTRODUODENOSCOPY: SHX5428

## 2020-12-08 SURGERY — COLONOSCOPY WITH PROPOFOL
Anesthesia: General

## 2020-12-08 MED ORDER — PHENYLEPHRINE HCL (PRESSORS) 10 MG/ML IV SOLN
INTRAVENOUS | Status: DC | PRN
  Administered 2020-12-08: 80 ug via INTRAVENOUS

## 2020-12-08 MED ORDER — PROPOFOL 500 MG/50ML IV EMUL
INTRAVENOUS | Status: AC
  Filled 2020-12-08: qty 50

## 2020-12-08 MED ORDER — PROPOFOL 10 MG/ML IV BOLUS
INTRAVENOUS | Status: DC | PRN
  Administered 2020-12-08: 80 mg via INTRAVENOUS

## 2020-12-08 MED ORDER — SIMETHICONE 40 MG/0.6ML PO SUSP
ORAL | Status: DC | PRN
  Administered 2020-12-08: 100 mL via ORAL

## 2020-12-08 MED ORDER — PROPOFOL 500 MG/50ML IV EMUL
INTRAVENOUS | Status: DC | PRN
Start: 2020-12-08 — End: 2020-12-08
  Administered 2020-12-08: 150 ug/kg/min via INTRAVENOUS

## 2020-12-08 MED ORDER — LIDOCAINE HCL (PF) 2 % IJ SOLN
INTRAMUSCULAR | Status: AC
  Filled 2020-12-08: qty 5

## 2020-12-08 MED ORDER — LIDOCAINE HCL (CARDIAC) PF 100 MG/5ML IV SOSY
PREFILLED_SYRINGE | INTRAVENOUS | Status: DC | PRN
  Administered 2020-12-08: 40 mg via INTRAVENOUS

## 2020-12-08 MED ORDER — SODIUM CHLORIDE 0.9 % IV SOLN: INTRAVENOUS | Status: DC

## 2020-12-08 NOTE — Anesthesia Postprocedure Evaluation (Signed)
Anesthesia Post Note  Patient: Sabrina Guerrero  Procedure(s) Performed: COLONOSCOPY WITH PROPOFOL ESOPHAGOGASTRODUODENOSCOPY (EGD)  Patient location during evaluation: PACU Anesthesia Type: General Level of consciousness: awake and oriented Pain management: pain level controlled Vital Signs Assessment: post-procedure vital signs reviewed and stable Respiratory status: spontaneous breathing and respiratory function stable Cardiovascular status: blood pressure returned to baseline Anesthetic complications: no   No notable events documented.   Last Vitals:  Vitals:   12/08/20 0920 12/08/20 0930  BP: (!) 133/93 128/72  Pulse: 65 63  Resp: 19 20  Temp:    SpO2: 100% 100%    Last Pain:  Vitals:   12/08/20 0902  TempSrc: Temporal  PainSc:                  [name],Sabrina Longhi

## 2020-12-08 NOTE — Anesthesia Preprocedure Evaluation (Signed)
Anesthesia Evaluation  Patient identified by MRN, date of birth, ID band Patient awake    Reviewed: Allergy & Precautions, NPO status , Patient's Chart, lab work & pertinent test results  Airway Mallampati: III  TM Distance: >3 FB Neck ROM: full    Dental  (+) Missing, Loose,    Pulmonary neg pulmonary ROS,    Pulmonary exam normal breath sounds clear to auscultation       Cardiovascular Exercise Tolerance: Good hypertension, Pt. on medications negative cardio ROS Normal cardiovascular exam Rhythm:Regular Rate:Normal     Neuro/Psych Seizures -, Well Controlled,  Anxiety negative neurological ROS  negative psych ROS   GI/Hepatic negative GI ROS, Neg liver ROS, GERD  ,  Endo/Other  negative endocrine ROSdiabetes, Well Controlled, Type 2, Oral Hypoglycemic Agents  Renal/GU negative Renal ROS  negative genitourinary   Musculoskeletal negative musculoskeletal ROS (+)   Abdominal (+) + obese,   Peds negative pediatric ROS (+)  Hematology negative hematology ROS (+)   Anesthesia Other Findings Past Medical History: No date: Diabetes mellitus without complication ([facility]) No date: Gastroesophageal reflux disease 03/23/2020: History of subdural hematoma (post traumatic)     Comment:  Bilateral, requiring surgery 05/20/2014: Memory deficit 05/20/2014: Partial symptomatic epilepsy with complex partial  seizures, not intractable, without status epilepticus ([place])     Comment:  most recent 09/10/20 No date: Ventral hernia  Past Surgical History: 01/2020: SUBDURAL HEMATOMA EVACUATION VIA CRANIOTOMY No date: TUBAL LIGATION No date: VENTRAL HERNIA REPAIR  BMI    Body Mass Index: 30.18 kg/m      Reproductive/Obstetrics negative OB ROS                             Anesthesia Physical Anesthesia Plan  ASA: 3  Anesthesia Plan: General   Post-op Pain Management:    Induction:  Intravenous  PONV Risk Score and Plan: Propofol infusion and TIVA  Airway Management Planned: Nasal Cannula  Additional Equipment:   Intra-op Plan:   Post-operative Plan:   Informed Consent: I have reviewed the patients History and Physical, chart, labs and discussed the procedure including the risks, benefits and alternatives for the proposed anesthesia with the patient or authorized representative who has indicated his/her understanding and acceptance.     Dental Advisory Given  Plan Discussed with: CRNA and Surgeon  Anesthesia Plan Comments:         Anesthesia Quick Evaluation

## 2020-12-08 NOTE — Transfer of Care (Signed)
Immediate Anesthesia Transfer of Care Note  Patient: Sabrina Guerrero  Procedure(s) Performed: Procedure(s): COLONOSCOPY WITH PROPOFOL (N/A) ESOPHAGOGASTRODUODENOSCOPY (EGD) (N/A)  Patient Location: PACU and Endoscopy Unit  Anesthesia Type:General  Level of Consciousness: sedated  Airway & Oxygen Therapy: Patient Spontanous Breathing and Patient connected to nasal cannula oxygen  Post-op Assessment: Report given to RN and Post -op Vital signs reviewed and stable  Post vital signs: Reviewed and stable  Last Vitals:  Vitals:   12/08/20 0850 12/08/20 0851  BP: 106/67 106/67  Pulse: 74   Resp: (!) 25   Temp:    SpO2: 34%     Complications: No apparent anesthesia complications

## 2020-12-08 NOTE — Op Note (Signed)
[facility] Gastroenterology Patient Name: Sabrina Guerrero Procedure Date: 12/08/2020 8:15 AM MRN: [id number] Account #: [id number] Date of Birth: 07-Jul-1945 Admit Type: Outpatient Age: 75 Room: [facility] ENDO ROOM 3 Gender: Female Note Status: Finalized Instrument Name: Park Meo [id number] Procedure:             Colonoscopy Indications:           Screening for colorectal malignant neoplasm Providers:             Jonathon Bellows MD, MD Referring MD:          No Local Md, MD (Referring MD) Medicines:             Monitored Anesthesia Care Complications:         No immediate complications. Procedure:             Pre-Anesthesia Assessment:                        - Prior to the procedure, a History and Physical was                         performed, and patient medications, allergies and                         sensitivities were reviewed. The patient's tolerance                         of previous anesthesia was reviewed.                        - The risks and benefits of the procedure and the                         sedation options and risks were discussed with the                         patient. All questions were answered and informed                         consent was obtained.                        - ASA Grade Assessment: II - A patient with mild                         systemic disease.                        After obtaining informed consent, the colonoscope was                         passed under direct vision. Throughout the procedure,                         the patient's blood pressure, pulse, and oxygen                         saturations were monitored continuously. The                         Colonoscope was  introduced through the anus and                         advanced to the the cecum, identified by the                         appendiceal orifice. The colonoscopy was performed                         with ease. The patient tolerated the procedure well.                          The quality of the bowel preparation was excellent. Findings:      The perianal and digital rectal examinations were normal.      A 5 mm polyp was found in the proximal ascending colon. The polyp was       sessile. The polyp was removed with a cold snare. Resection and       retrieval were complete.      The exam was otherwise without abnormality on direct and retroflexion       views. Impression:            - One 5 mm polyp in the proximal ascending colon,                         removed with a cold snare. Resected and retrieved.                        - The examination was otherwise normal on direct and                         retroflexion views. Recommendation:        - Discharge patient to home (with escort).                        - Resume previous diet.                        - Continue present medications.                        - Await pathology results.                        - Repeat colonoscopy is not recommended due to current                         age (19 years or older) for surveillance based on                         pathology results. Procedure Code(s):     --- Professional ---                        [phone], Colonoscopy, flexible; with removal of                         tumor(s), polyp(s), or other lesion(s) by snare  technique Diagnosis Code(s):     --- Professional ---                        Z12.11, Encounter for screening for malignant neoplasm                         of colon                        K63.5, Polyp of colon CPT copyright 2019 American Medical Association. All rights reserved. The codes documented in this report are preliminary and upon coder review may  be revised to meet current compliance requirements. Jonathon Bellows, MD Jonathon Bellows MD, MD 12/08/2020 8:50:01 AM This report has been signed electronically. Number of Addenda: 0 Note Initiated On: 12/08/2020 8:15 AM Scope Withdrawal Time: 0 hours 9 minutes 26  seconds  Total Procedure Duration: 0 hours 13 minutes 14 seconds  Estimated Blood Loss:  Estimated blood loss: none.      [facility]

## 2020-12-08 NOTE — H&P (Signed)
Sabrina Bellows, MD [address], [place], [place], [place], [address] [address], [place], [place], [place], [address] Phone: [phone]  Fax: [phone]  Primary Care Physician:  Cheyenne Adas, MD   Pre-Procedure History & Physical: HPI:  Sabrina Guerrero is a 75 y.o. 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 Authorizing Provider  omeprazole (PRILOSEC) 20 MG capsule Take 2 capsules (40 mg total) by mouth 2 (two) times daily before a meal. 09/24/19  Yes Irene Shipper, MD  OXcarbazepine (TRILEPTAL) 150 MG tablet Take 1 tablet by mouth 2 (two) times daily. 12/23/18  Yes [provider]    Allergies as of 11/17/2020   (No Known Allergies)    Family History  Problem Relation Age of Onset   Arthritis Mother    Cancer Brother        lung   Colon cancer Neg Hx    Esophageal cancer Neg Hx    Stomach cancer Neg Hx    Liver disease Neg Hx     Social History   Socioeconomic History   Marital status: Divorced    Spouse name: Not on file   Number of children: 3   Years of education: 18 years   Highest education level: Not on file  Occupational History   Occupation: unemployed  Tobacco Use   Smoking status: Never   Smokeless tobacco: Never  Substance and Sexual Activity   Alcohol use: No   Drug use: No    Sexual activity: Never  Other Topics Concern   Not on file  Social History Narrative   10/06/20 lives with dgtr   Patient is right handed.   Patient does not drink caffeine.   Social Determinants of Health   Financial Resource Strain: Not on file  Food Insecurity: Not on file  Transportation Needs: Not on file  Physical Activity: Not on file  Stress: Not on file  Social Connections: Not on file  Intimate Partner Violence: Not on file    Review of Systems: See HPI, otherwise negative ROS  Physical Exam: BP (!) 143/76   Pulse 65   Temp (!) 96 F (35.6 C) (Temporal)   Resp 18   Ht 5\' 2"  (1.575 m)   Wt 74.8 kg   SpO2 100%   BMI 30.18 kg/m  General:   Alert,  pleasant and cooperative in NAD Head:  Normocephalic and atraumatic. Neck:  Supple; no masses or thyromegaly. Lungs:  Clear throughout to auscultation, normal respiratory effort.    Heart:  +S1, +S2, Regular rate and rhythm, No edema. Abdomen:  Soft, nontender and nondistended. Normal bowel sounds, without guarding, and without rebound.   Neurologic:  Alert and  oriented x4;  grossly normal neurologically.  Impression/Plan: CUBA [name] is here for an endoscopy and colonoscopy  to be performed for  evaluation of dysphagia and colon cancer screening     Risks, benefits, limitations, and alternatives regarding endoscopy have been reviewed with the patient.  Questions have been answered.  All parties agreeable.   Sabrina Bellows, MD  12/08/2020, 8:11 AM

## 2020-12-08 NOTE — Anesthesia Procedure Notes (Signed)
Date/Time: 12/08/2020 8:23 AM Performed by: Doreen Salvage, CRNA Pre-anesthesia Checklist: Patient identified, Emergency Drugs available, Suction available and Patient being monitored Patient Re-evaluated:Patient Re-evaluated prior to induction Oxygen Delivery Method: Nasal cannula Induction Type: IV induction Dental Injury: Teeth and Oropharynx as per pre-operative assessment  Comments: Nasal cannula with etCO2 monitoring

## 2020-12-08 NOTE — Op Note (Signed)
[facility] Gastroenterology Patient Name: Sabrina Guerrero Procedure Date: 12/08/2020 8:17 AM MRN: [id number] Account #: [id number] Date of Birth: 05/11/1945 Admit Type: Outpatient Age: 75 Room: [facility] ENDO ROOM 3 Gender: Female Note Status: Finalized Instrument Name: Upper Endoscope [id number] Procedure:             Upper GI endoscopy Indications:           Dysphagia Providers:             Jonathon Bellows MD, MD Referring MD:          No Local Md, MD (Referring MD) Medicines:             Monitored Anesthesia Care Complications:         No immediate complications. Procedure:             Pre-Anesthesia Assessment:                        - Prior to the procedure, a History and Physical was                         performed, and patient medications, allergies and                         sensitivities were reviewed. The patient's tolerance                         of previous anesthesia was reviewed.                        - The risks and benefits of the procedure and the                         sedation options and risks were discussed with the                         patient. All questions were answered and informed                         consent was obtained.                        - ASA Grade Assessment: II - A patient with mild                         systemic disease.                        After obtaining informed consent, the endoscope was                         passed under direct vision. Throughout the procedure,                         the patient's blood pressure, pulse, and oxygen                         saturations were monitored continuously. The Endoscope                         was introduced  through the mouth, and advanced to the                         third part of duodenum. The upper GI endoscopy was                         accomplished with ease. The patient tolerated the                         procedure well. Findings:      The stomach was normal.       The examined duodenum was normal.      The cardia and gastric fundus were normal on retroflexion.      The examined esophagus was normal. Biopsies were taken with a cold       forceps for histology. Impression:            - Normal stomach.                        - Normal examined duodenum.                        - Normal esophagus. Biopsied. Recommendation:        - Await pathology results.                        - Recommend getting false set of teeth to help with                         chewing food Procedure Code(s):     --- Professional ---                        [phone], Esophagogastroduodenoscopy, flexible,                         transoral; with biopsy, single or multiple Diagnosis Code(s):     --- Professional ---                        R13.10, Dysphagia, unspecified CPT copyright 2019 American Medical Association. All rights reserved. The codes documented in this report are preliminary and upon coder review may  be revised to meet current compliance requirements. Jonathon Bellows, MD Jonathon Bellows MD, MD 12/08/2020 8:31:51 AM This report has been signed electronically. Number of Addenda: 0 Note Initiated On: 12/08/2020 8:17 AM Estimated Blood Loss:  Estimated blood loss: none.      [facility]

## 2020-12-09 ENCOUNTER — Encounter: Payer: Self-pay | Admitting: Gastroenterology

## 2020-12-09 LAB — SURGICAL PATHOLOGY

## 2020-12-15 ENCOUNTER — Encounter: Payer: No Typology Code available for payment source | Attending: Psychology | Admitting: Psychology

## 2020-12-15 ENCOUNTER — Other Ambulatory Visit: Payer: Self-pay

## 2020-12-15 ENCOUNTER — Encounter: Payer: Self-pay | Admitting: Psychology

## 2020-12-15 DIAGNOSIS — G40209 Localization-related (focal) (partial) symptomatic epilepsy and epileptic syndromes with complex partial seizures, not intractable, without status epilepticus: Secondary | ICD-10-CM | POA: Diagnosis present

## 2020-12-15 DIAGNOSIS — R413 Other amnesia: Secondary | ICD-10-CM | POA: Insufficient documentation

## 2020-12-15 DIAGNOSIS — G40909 Epilepsy, unspecified, not intractable, without status epilepticus: Secondary | ICD-10-CM | POA: Insufficient documentation

## 2020-12-15 DIAGNOSIS — Z87828 Personal history of other (healed) physical injury and trauma: Secondary | ICD-10-CM | POA: Insufficient documentation

## 2020-12-15 DIAGNOSIS — I679 Cerebrovascular disease, unspecified: Secondary | ICD-10-CM | POA: Insufficient documentation

## 2020-12-15 NOTE — Progress Notes (Signed)
Neuropsychological Evaluation   Patient:  Sabrina Guerrero   DOB: [birth year]  MR Number: [id number]  Location: [facility] PHYSICAL MEDICINE AND REHABILITATION [place], [address] [id number] [place] [address] Dept: [phone]  Start: 8 AM End: 9 AM  Provider/Observer:     Edgardo Roys PsyD  Chief Complaint:      Chief Complaint  Patient presents with   Cerebrovascular Accident   Memory Loss    Reason For Service:     Sabrina Guerrero is a 75 year old female referred by the patient's PCP Landis Gandy, MD and is also being followed by the [facility].  The patient describes increasing memory difficulties as well as a period of time between 2012 and 2019 that she cannot remember at all.  The patient was admitted to [facility] on 04/05/2019 and was found to have had bilateral subdural hematomas.  The patient was emergently taken to the operating room for bilateral bur holes for subdural hematoma evacuation.  Prior to this event, the patient had had previous falls including one on December 05, 2019 and had been having significant gait instability prior to these falls.  Family felt that she had not been acting completely normal.  The patient did have a prior history of seizure disorder and was followed by Dr. Jannifer Franklin with [facility].  She had had abnormal EEG evaluations but has had no known seizure activity after January 2015 and was well managed on Trileptal twice daily.  The patient has a past medical history that includes episodes of altered mental status, history of shortness of breath, GERD, type 2 diabetes without complications, generalized anxiety disorder.  History of partial seizures, memory deficits and past history of subdural hematoma.  The patient reports that when she had her significant bleed requiring neurosurgical interventions that they were  able to address it quite quickly.  The patient fell but did not tell others but her son came over later and noticed alterations in the patient and took her directly to the hospital.  She reports that she fell and struck her head on cement.  The patient describes her memory loss as being somewhat problematic for her.  She reports that she feels like she is remembering less than she used to and does not remember her kids childhood events and other past history.  She reports that she is doing okay with "new stuff" but does feel like she is having some issues with this.  The patient reports having neurosurgical interventions following her brain bleed and has recovered well but feels like since that time she has been forgetting more things and feels her memory is still improving but will forget where she put things.  The patient describes past events prior to 2012 but then she has a near complete loss of memory for events between 2012 and 2019.  The patient reports that she sleeps well and goes to bed between 9 and 10 PM and wakes up between 2 and 3 AM to go to the bathroom and then is able to go right back to sleep.  She describes a good appetite but feels like her taste buds are "off" but denies this is related to [place] or other issues.  The patient denies any geographic disorientation or visual spatial changes and denies any tremors.  She also denies any changes in motor functioning.  Neuro imaging measures available in her EMR are of an  MRI conducted in 2016 and requested due to the patient's reports of memory problems and seizures.  This is prior to her fall in 2021 resulted in bilateral subdural hematomas.  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  The patient had a CT that I was able to find in her EMR from [place] on 04/2019.  This identified resolved  subdural hematoma over both cerebral convexities.  There was no new hemorrhage noted.  No acute large vascular territory infarct noted.  There was patchy hyperattenuation in the periventricular and subcortical white matter likely from chronic small vessel disease.   Tests Administered: Wechsler Adult Intelligence Scale, 4th Edition (WAIS-IV) Wechsler Memory Scale, 4th Edition (WMS-IV); Older Adult Battery   Participation Level:   Active  Participation Quality:  Appropriate      Behavioral Observation:  [name] completed 150 minutes of neuropsychological testing with technician, Dina Rich, BA, under the supervision of Ilean Skill, PsyD., Clinical Neuropsychologist. The patient did not appear overtly distressed by the testing session, per behavioral observation or via self-report to the technician. Rest breaks were offered.  Well Groomed, Alert, and Appropriate.   Test Results:   Initially, an estimation was made as to the patient's historic/premorbid intellectual and cognitive functioning to allow for comparisons between predicted historical functioning and current achieved scores on a wide range of neuropsychological and cognitive tests.  The patient graduated from college as well as doing some graduate work in Stage manager.  She majored in math.  The patient was always a good student in school.  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  The patient was in the [employer] serving in the [employer] and worked in air traffic control with the [employer].  She was honorably discharged.  It is conservatively estimated that the patient was likely functioning in the high average range relative to a normative population and we will utilize a standard score of around 115 (1 standard deviation) above a normative population as an estimate of premorbid functioning.  Composite Score Summary         Scale Sum of Scaled  Scores Composite Score Percentile Rank 95% Conf. Interval Qualitative Description  Verbal Comprehension 24 VCI 89 23 84-95 Low Average  Perceptual Reasoning 33 PRI 105 63 99-111 Average  Working Memory 14 WMI 83 13 77-91 Low Average  Processing Speed 11 PSI 76 5 70-87 Borderline  Full Scale 82 FSIQ 87 19 83-91 Low Average  General Ability 57 GAI 97 42 92-102 Average    The patient was administered the Wechsler Adult Intelligence Scale-IV in order to provide a well standardized excellently normed battery of a wide range of cognitive measures.  However, it should be cautioned that the patient's current full-scale IQ scores and other composite scores should be interpreted in the setting of her current functioning and not descriptive of her lifelong functioning.  The patient is describing significant cognitive changes and memory changes over historic function.  The patient produced a full-scale IQ score of 87 which falls at the 19th percentile and is in the low average range.  This is well below predicted levels of premorbid functioning and suggest that there are number of cognitive domains that are functioning well below her premorbid functioning levels.  We also calculated the patient's general abilities index score  which places less emphasis on measures most sensitive to acute changes including auditory encoding and information processing speed/focus execute abilities.  The patient produced a general abilities index score of 97 which falls at the 42nd percentile and is in the average range relative to normative population.  This does suggest that there are more issues that the patient is facing then symptoms changes in attention and concentration variables. .           Verbal Comprehension Subtests Summary      Subtest Raw Score Scaled Score Percentile Rank Reference Group Scaled Score SEM  Similarities 20 8 25 7  0.95  Vocabulary 33 9 37 9 0.67  Information 9 7 16 8  0.73  (Comprehension) 14 6 9  5  1.27    The patient produced a verbal comprehension index score of 89 which falls at the 23rd percentile and is in the low average range relative to a normative population.  There is some significant scatter noted in subtest performance.  The patient performed in the average range on measures related to verbal reasoning and problem-solving and her general vocabulary knowledge.  The patient had mild to moderate difficulties on items related to her general fund of information and her social judgment comprehension.               Perceptual Reasoning Subtests Summary     Subtest Raw Score Scaled Score Percentile Rank Reference Group Scaled Score SEM  Block Design 33 11 63 7 0.99  Matrix Reasoning 16 12 75 8 0.90  Visual Puzzles 11 10 50 7 0.99  (Picture Completion) 6 7 16 5  0.99      The patient produced a perceptual reasoning index score of 105 which falls at the 63rd percentile and is in the average range.  The majority of her scores on various subtest were in the average to high average range relative to normative population including her abilities related to visual analysis and organization, visual reasoning and problem-solving and visual judgment and estimation abilities.  The patient did display difficulties identifying visual anomalies within a visual gestalt.             Working Electrical engineer Raw Score Scaled Score Percentile Rank Reference Group Scaled Score SEM  Digit Span 20 8 25 6  0.73  Arithmetic 8 6 9 5  1.20    The patient produced a working memory index score of 83 which falls at the 13th percentile and is in the low average range.  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  These professional activities on top of her educational accomplishments would require much greater abilities and  auditory encoding abilities that she achieved on the current assessment.  The patient showed mild deficits and performance is significantly below predicted levels on pure auditory encoding variables and significant difficulties when asked to process and actively work on current encoded registers.  Given the fact that the patient always did very well in mathematics and likely had very good auditory encoding this is clearly indicative of significant change related to auditory encoding and processing abilities.               Processing Speed Subtests Summary      Subtest Raw Score Scaled Score Percentile Rank Reference Group  Scaled Score SEM  Symbol Search 10 4 2 2  1.12  Coding 36 7 16 4  1.12    The patient produced a processing speed index score of 76 which falls at the 5th percentile and is in the borderline range relative to a normative population.  The patient demonstrated significant difficulties with visual scanning, visual searching and overall speed of mental operations.  These deficits and focus execute abilities are significantly below premorbid estimates as this is also likely an area that she always did quite well and given her educational and occupational history.          Index Score Summary       Index Sum of Scaled Scores Index Score Percentile Rank 95% Confidence Interval Qualitative Descriptor  Auditory Memory (AMI) 23 75 5 70-82 Borderline  Visual Memory (VMI) 25 113 81 108-118 High Average  Immediate Memory (IMI) 27 93 32 87-100 Average  Delayed Memory (DMI) 21 81 10 75-90 Low Average    The patient was administered the Wechsler Memory Scale-IV to provide a structured well normed assessment of a broad range of memory and learning variables.  She was assessed in both auditory and visual modalities.  On the Wechsler Adult Intelligence Scale the patient showed significant impairments on both auditory encoding variables as well as focus execute abilities variables.  The patient did  better on visual encoding components on the Weschler memory scales where she produced standard scaled scores of average performance relative to a normative population with regard to visual encoding.  The patient's auditory encoding deficits versus reduced but adequate visual encoding deficits correlate quite well with specific memory deficits noted.  The patient produced significant deficits for auditory learning and memory but her visual memory was generally consistent with premorbid estimations.  The patient produced an auditory memory index score of 75 which falls at the 5th percentile and is in the borderline range relative to a normative population.  The patient's visual memory index score was 113 which fell at the 81st percentile and was in the high average range.  This is a stark contrast between auditory versus visual memory and is also consistent with significant auditory encoding deficits with generally average visual encoding capacities.  Breaking the patient's memory functions down between immediate versus delayed the patient produced an immediate memory index score of 93 which fell at the 32nd percentile and was in the average range.  Delayed memory index score was 81 which fell at the 10th percentile and was in the low average range.  The patient did show some adequate improvement in memory functions under recognition/cued recall formats.          Primary Subtest Scaled Score Summary     Subtest Domain Raw Score Scaled Score Percentile Rank  Logical Memory I AM 22 7 16   Logical Memory II AM 7 5 5   Verbal Paired Associates I AM 13 7 16   Verbal Paired Associates II AM 2 4 2   Visual Reproduction I VM 36 13 84  Visual Reproduction II VM 23 12 75  Symbol Span VWM 14 9 37                Auditory Memory Process Score Summary      Process Score Raw Score Scaled Score Percentile Rank Cumulative Percentage (Base Rate)  LM II Recognition 16 - - 17-25%  VPA II Recognition 28 - - 51-75%  Visual Memory Process Score Summary      Process Score Raw Score Scaled Score Percentile Rank Cumulative Percentage (Base Rate)  VR II Recognition 3 - - 17-25%          ABILITY-MEMORY ANALYSIS   Ability Score:    GAI: 97 Date of Testing:           WAIS-IV; WMS-IV 2020/09/01             Predicted Difference Method    Index Predicted WMS-IV Index Score Actual WMS-IV Index Score Difference Critical Value   Significant Difference Y/N Base Rate  Auditory Memory 98 75 23 10.41 Y 4%  Visual Memory 98 113 -15 7.89 Y 10-15%  Immediate Memory 98 93 5 9.97 N    Delayed Memory 98 81 17 12.33 Y 10%  Statistical significance (critical value) at the .01 level.      Overall, the patient displayed significant deficits with regard to auditory encoding, auditory memory including weaknesses for storage and organization and significant deficits in retrieval of auditory information after period of delay.  The patient's visual memory functions were generally within normal limits displaying a marked difference between auditory versus visual memory and learning and visual attention versus auditory attentional variables.  The patient's delayed memory function was also significantly impaired but there were moderate improvements with cueing and recognition challenges suggesting that her primary memory deficits are related to severe and significant auditory encoding deficits and deficits with regard to retrieval of previously learned information rather than simply related to deficits of storage and organization of new information.  Visual memory functions appear to be within normal limits.   Summary of Results:   Overall, the results of the current neuropsychological testing on a wide range of various cognitive domains suggest that the patient is showing significant difficulties with retrieval of previously learned information and loss of previously learned information, significant deficits  with regard to auditory encoding while visual encoding capacities are generally within normal limits and significant deficits with regard to information processing speed and visual scanning and visual searching abilities.  The patient has generally well-preserved functions with regard to visual-spatial and visual analysis with the exception of difficulties identifying visual anomalies within a visual gestalt.  Visual constructional abilities appear to be within normal limits.  The patient also showed significant auditory attentional and memory deficits with generally well-preserved visual attentional and memory functions.  The patient had severe and significant difficulties for auditory encoding, auditory storage and organization and deficits with regard to retrieval of previously experienced auditory information.  Impression/Diagnosis:   Overall, the results of the current neuropsychological evaluation are generally consistent with subjective reports by the patient of difficulty with recall of previously learned long-term memory functions, significant memory deficits with regard to auditory learning and significant attention and concentration deficits primarily in the areas of auditory encoding (with generally well-preserved visual encoding) and significant deficits with regard to information processing speed.  The patient's visual memory and learning were generally within normal limits and consistent with premorbid estimations.  The neuropsychological strengths and weaknesses would be consistent with significant frontal and particularly left temporal dysfunction and dysregulation.  The patient shows primary deficits with regard to auditory memory and learning, auditory encoding deficits, reduction in focus execute abilities and information processing speed with only mild deficits in visual-spatial and visual constructional abilities and generally well-preserved visual memory and learning.  Previous neuroimaging  studies in 2016 suggested some mild small vessel disease but no specific atrophy  in temporal or hippocampal locations.  The patient was already at this time describing significant memory difficulties and had a history of seizures already presenting at that time.  The patient had a CT scan done that I was able to find in 04/2019 suggesting resolution of her bilateral cortical subdural hematomas as well as indications of possible chronic small vessel disease as well.  As far as diagnostic considerations, the patient clearly display some significant lateralization patterns on neuropsychological testing.  The patient shows significant deficits for auditory attention and auditory memory functions versus well-preserved visual memory functions.  Long-term and new learning deficits particularly for auditory components are noted.  The patient also shows indications of patterns consistent with white matter involvement related to reduced information processing speed and frontal lobe involvement as well.  It is likely that the patient's current difficulties are related to a multifactorial model of difficulty including the progressive worsening of white matter involvement due to small vessel disease and potential acute worsening due to significant concussive and TBI injuries as well as the results of her bilateral subdural hematomas.  However, the patient was identified in memory and cognitive difficulties well before her 2021 fall with subdural hematomas.  The patient has been followed by neurology with complaints of memory deficits at least as far back as 2016 and was followed by Dr. Gaynell Face and MRIs were performed at that time.  The patient also likely has had some impact of her recurrent seizure and the lateralization of her cognitive strengths and weaknesses would be consistent with focal abnormalities and left temporal functions.  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  I will sit down with the patient go over the results of the current neuropsychological evaluation and talk about recommendations specifically.  Clearly, the patient will need to have ongoing support from family members due to significant memory deficits and memory difficulties.  The patient is doing much better for visual learning and memory versus auditory memory and learning and efforts should be made to present information visually as much as possible.  The patient will need to continue to address her history of seizures and to be monitored for the appropriateness of antiseizure medications.  Functional adjustments will be needed as the patient is clearly showing some significant cognitive deficits in memory, executive functioning, attentional deficits and information processing speed deficits.  Diagnosis:    History of subdural hematoma (post traumatic)  Memory deficit  Seizure disorder (HCC)  Small vessel disease, cerebrovascular  Partial symptomatic epilepsy with complex partial seizures, not intractable, without status epilepticus ([place])   _____________________ Ilean Skill, Psy.D. Clinical Neuropsychologist

## 2020-12-19 DIAGNOSIS — G40209 Localization-related (focal) (partial) symptomatic epilepsy and epileptic syndromes with complex partial seizures, not intractable, without status epilepticus: Secondary | ICD-10-CM

## 2020-12-19 MED ORDER — LACOSAMIDE 100 MG PO TABS: 100.0000 mg | ORAL_TABLET | Freq: Two times a day (BID) | ORAL | 0 refills | Status: DC

## 2020-12-20 ENCOUNTER — Encounter: Payer: Self-pay | Admitting: Psychology

## 2020-12-20 ENCOUNTER — Encounter (HOSPITAL_BASED_OUTPATIENT_CLINIC_OR_DEPARTMENT_OTHER): Payer: No Typology Code available for payment source | Admitting: Psychology

## 2020-12-20 ENCOUNTER — Other Ambulatory Visit: Payer: Self-pay

## 2020-12-20 DIAGNOSIS — R413 Other amnesia: Secondary | ICD-10-CM | POA: Diagnosis not present

## 2020-12-20 DIAGNOSIS — G40909 Epilepsy, unspecified, not intractable, without status epilepticus: Secondary | ICD-10-CM | POA: Diagnosis not present

## 2020-12-20 DIAGNOSIS — I679 Cerebrovascular disease, unspecified: Secondary | ICD-10-CM | POA: Diagnosis not present

## 2020-12-20 DIAGNOSIS — G40209 Localization-related (focal) (partial) symptomatic epilepsy and epileptic syndromes with complex partial seizures, not intractable, without status epilepticus: Secondary | ICD-10-CM

## 2020-12-20 DIAGNOSIS — Z87828 Personal history of other (healed) physical injury and trauma: Secondary | ICD-10-CM | POA: Diagnosis not present

## 2020-12-20 NOTE — Progress Notes (Signed)
12/20/2020 10 AM-11 AM: Today's visit was an in person visit that was conducted in my outpatient clinic office with the patient, her daughter and myself present.  Today I provided feedback regarding the results of the recent neuropsychological evaluation that can be found in the patient's EMR in its entirety dated 12/15/2020.  I have included the impression/diagnostic and recommendation portions of my report below for convenience.  We reviewed the results of the testing and the focal nature of her deficits primarily residing in the left hemisphere consistent with both her seizures as well as cerebrovascular event including subdural hematoma.  The patient was able to comprehend and understand these results.  The family does have a plan for the patient to move in with her daughter at some point in the future as the patient is still living by herself.  However, the patient's executive functioning, visual memory and other well-preserved aspects of her cognition do suggest that she is competent and generally safe to be living alone at this time.  There are no indications of any progressive cortical dementias at this time such as Alzheimer's, Lewy body etc.   Impression/Diagnosis:                     Overall, the results of the current neuropsychological evaluation are generally consistent with subjective reports by the patient of difficulty with recall of previously learned long-term memory functions, significant memory deficits with regard to auditory learning and significant attention and concentration deficits primarily in the areas of auditory encoding (with generally well-preserved visual encoding) and significant deficits with regard to information processing speed.  The patient's visual memory and learning were generally within normal limits and consistent with premorbid estimations.  The neuropsychological strengths and weaknesses would be consistent with significant frontal and particularly left temporal  dysfunction and dysregulation.  The patient shows primary deficits with regard to auditory memory and learning, auditory encoding deficits, reduction in focus execute abilities and information processing speed with only mild deficits in visual-spatial and visual constructional abilities and generally well-preserved visual memory and learning.  Previous neuroimaging studies in 2016 suggested some mild small vessel disease but no specific atrophy in temporal or hippocampal locations.  The patient was already at this time describing significant memory difficulties and had a history of seizures already presenting at that time.  The patient had a CT scan done that I was able to find in 04/2019 suggesting resolution of her bilateral cortical subdural hematomas as well as indications of possible chronic small vessel disease as well.  As far as diagnostic considerations, the patient clearly display some significant lateralization patterns on neuropsychological testing.  The patient shows significant deficits for auditory attention and auditory memory functions versus well-preserved visual memory functions.  Long-term and new learning deficits particularly for auditory components are noted.  The patient also shows indications of patterns consistent with white matter involvement related to reduced information processing speed and frontal lobe involvement as well.  It is likely that the patient's current difficulties are related to a multifactorial model of difficulty including the progressive worsening of white matter involvement due to small vessel disease and potential acute worsening due to significant concussive and TBI injuries as well as the results of her bilateral subdural hematomas.  However, the patient was identified in memory and cognitive difficulties well before her 2021 fall with subdural hematomas.  The patient has been followed by neurology with complaints of memory deficits at least as far back as 2016 and  was followed by Dr. Gaynell Face and MRIs were performed at that time.  The patient also likely has had some impact of her recurrent seizure and the lateralization of her cognitive strengths and weaknesses would be consistent with focal abnormalities and left temporal functions.  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  I will sit down with the patient go over the results of the current neuropsychological evaluation and talk about recommendations specifically.  Clearly, the patient will need to have ongoing support from family members due to significant memory deficits and memory difficulties.  The patient is doing much better for visual learning and memory versus auditory memory and learning and efforts should be made to present information visually as much as possible.  The patient will need to continue to address her history of seizures and to be monitored for the appropriateness of antiseizure medications.  Functional adjustments will be needed as the patient is clearly showing some significant cognitive deficits in memory, executive functioning, attentional deficits and information processing speed deficits.   Diagnosis:                                History of subdural hematoma (post traumatic)   Memory deficit   Seizure disorder (HCC)   Small vessel disease, cerebrovascular   Partial symptomatic epilepsy with complex partial seizures, not intractable, without status epilepticus ([place])     _____________________ Ilean Skill, Psy.D. Clinical Neuropsychologist

## 2020-12-21 ENCOUNTER — Ambulatory Visit (INDEPENDENT_AMBULATORY_CARE_PROVIDER_SITE_OTHER): Payer: Medicare Other | Admitting: Neurology

## 2020-12-21 DIAGNOSIS — G40209 Localization-related (focal) (partial) symptomatic epilepsy and epileptic syndromes with complex partial seizures, not intractable, without status epilepticus: Secondary | ICD-10-CM | POA: Diagnosis not present

## 2020-12-21 NOTE — Procedures (Signed)
    History:  89 yof with seizure  EEG classification:  Awake and asleep  Description of the recording: The background rhythms of this recording consists of a fairly well modulated medium amplitude background activity of 9 Hz. As the record progresses, the patient initially is in the waking state, but appears to enter the early stage II sleep during the recording, with rudimentary sleep spindles and vertex sharp wave activity seen. During the wakeful state, photic stimulation is performed, and no abnormal responses were seen. Hyperventilation was also performed, no abnormal response seen. No epileptiform discharges seen during this recording. There was no focal slowing. EKG monitor shows no evidence of cardiac rhythm abnormalities with a heart rate of 72.  Impression: This is a normal EEG recording in the waking and sleeping state. No evidence interictal epileptiform discharges were seen at any time during the recording.  A normal EEG does not exclude a diagnosis of epilepsy.    Alric Ran, MD [facility]

## 2021-01-02 ENCOUNTER — Encounter: Payer: Self-pay | Admitting: Gastroenterology

## 2021-01-02 NOTE — Progress Notes (Signed)
Ok

## 2021-01-16 ENCOUNTER — Other Ambulatory Visit: Payer: Self-pay | Admitting: Neurology

## 2021-02-15 ENCOUNTER — Telehealth: Payer: Self-pay | Admitting: Nurse Practitioner

## 2021-02-15 ENCOUNTER — Other Ambulatory Visit: Payer: Self-pay

## 2021-02-15 ENCOUNTER — Ambulatory Visit: Payer: Self-pay | Admitting: Nurse Practitioner

## 2021-02-15 NOTE — Telephone Encounter (Signed)
Pt was a no show on 02/15/21.

## 2021-02-16 ENCOUNTER — Ambulatory Visit (INDEPENDENT_AMBULATORY_CARE_PROVIDER_SITE_OTHER): Payer: Medicare Other | Admitting: Gastroenterology

## 2021-02-16 ENCOUNTER — Encounter: Payer: Self-pay | Admitting: Gastroenterology

## 2021-02-16 ENCOUNTER — Other Ambulatory Visit: Payer: Self-pay

## 2021-02-16 VITALS — BP 127/82 | HR 85 | Temp 98.2°F | Wt 165.0 lb

## 2021-02-16 DIAGNOSIS — K5900 Constipation, unspecified: Secondary | ICD-10-CM

## 2021-02-16 DIAGNOSIS — R1319 Other dysphagia: Secondary | ICD-10-CM

## 2021-02-16 NOTE — Progress Notes (Signed)
Jonathon Bellows MD, MRCP(U.K) [address]  [place]  [place], [place] [address]  Main: [phone]  Fax: [phone]   Primary Care Physician: Cheyenne Adas, MD  Primary Gastroenterologist:  Dr. Jonathon Bellows   C/c : Follow up dysphagia   HPI: Sabrina Guerrero is a 76 y.o. female   Summary of history :  Initallly referred and seen on 11/17/2020 for abdominal pain she was previously seen by Dr. Henrene Pastor for abdominal pain back in May 2021.  At that time she was noted to have GERD, issues with gas, had dysphagia was on omeprazole 40 mg twice a day with resolution of symptoms plan was to do an upper endoscopy and colonoscopy.  I do not see any report of any of these procedures.  She came into the ER on 10/27/2020 for upper abdominal pain related to food intake sharp in nature she had been on omeprazole 40 mg twice a day labs were performed with a normal lipase, hepatic function and CBC.  Creatinine was 0.6.  At initial visit had dysphagia of a few months duration . Had some issues with gas and bloating.  She states she usually has a bowel movement every day but sometimes does not have 1.    Interval history  11/17/2020-02/16/2021   12/08/2020: colonoscopy : Small polyp resected , EGD no abnormality seen. Biopsies taken from esophagus showed no evidence of EOE  She had no teeth on examination of her mouth . Since her last visit says no issues with constipation while on miralax. No abdominal pain.   Current Outpatient Medications  Medication Sig Dispense Refill   Lacosamide 100 MG TABS Take 1 tablet (100 mg total) by mouth in the morning and at bedtime. 180 tablet 0   omeprazole (PRILOSEC) 40 MG capsule Take 40 mg by mouth 2 (two) times daily.     OXcarbazepine (TRILEPTAL) 150 MG tablet Take 1 tablet by mouth 2 (two) times daily.     thiamine 100 MG tablet Take 1 tablet by mouth daily.     No current facility-administered medications for this visit.    Allergies as of  02/16/2021   (No Known Allergies)    ROS:  General: Negative for anorexia, weight loss, fever, chills, fatigue, weakness. ENT: Negative for hoarseness, difficulty swallowing , nasal congestion. CV: Negative for chest pain, angina, palpitations, dyspnea on exertion, peripheral edema.  Respiratory: Negative for dyspnea at rest, dyspnea on exertion, cough, sputum, wheezing.  GI: See history of present illness. GU:  Negative for dysuria, hematuria, urinary incontinence, urinary frequency, nocturnal urination.  Endo: Negative for unusual weight change.    Physical Examination:   BP 127/82    Pulse 85    Temp 98.2 F (36.8 C) (Oral)    Wt 165 lb (74.8 kg)    BMI 30.18 kg/m   General: Well-nourished, well-developed in no acute distress.  Eyes: No icterus. Conjunctivae pink. Mouth: very poor dentition - very few teeth seen  Neuro: Alert and oriented x 3.  Grossly intact. Skin: Warm and dry, no jaundice.   Psych: Alert and cooperative, normal mood and affect.   Imaging Studies: No results found.  Assessment and Plan:   Sabrina Guerrero is a 76 y.o. y/o female is here today to follow up for abdominal pain and dysphagia. S/p EGD and colonoscopy with no evidence of stricture of EOE. Likely etiology for dysphagia is poor dentition- she has  very poor/no dentition Plan  Get false teeth  if dysphagia persists after then please refer back  Continue miralax for constipation      I have discussed alternative options, risks & benefits,  which include, but are not limited to, bleeding, infection, perforation,respiratory complication & drug reaction.  The patient agrees with this plan & written consent will be obtained.      Dr Jonathon Bellows  MD,MRCP [facility]) Follow up in as needed

## 2021-02-27 NOTE — Progress Notes (Signed)
PATIENT: Sabrina Guerrero DOB: 04/16/1945  REASON FOR VISIT: Follow up for seizures HISTORY FROM: Patient and her daughter  PRIMARY NEUROLOGIST: Dr. April Manson  HISTORY OF PRESENT ILLNESS: Today 02/28/21 Sabrina Guerrero here today for follow-up with history of seizures.  Had about 3 seizures in 2022, at last visit with Dr. Jannifer Guerrero, Trileptal was increased to 300 mg twice daily. EEG was normal in November 2022.  Vimpat was added in November 2022 due to breakthrough seizures. 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 December 2021 had brain surgery for SDH. She lives with her daughter. Walks 20 minutes almost daily. No falls.   HISTORY  10/06/20 Dr. Jannifer Guerrero: Sabrina Guerrero is a 76 year old right-handed black female with a history of diabetes and a history of seizures.  The patient has been well controlled on a very low-dose of Trileptal taking 150 mg twice daily for a number of years.  Within the last year, she did have a fall and developed bilateral subdural hematoma requiring surgery.  She had fallen in October 2021, presented to the hospital on 01 February 2020 with gait instability and confusion.  She lives with her daughter who has noted that over the last several months she has sustained 3 witnessed seizure events.  The patient would not remember whether or not she had a seizure if she were alone.  The patient will have a 10 to 15-minute episode of staring off and associated confusion with gradual return to normal baseline.  The patient has developed a memory disorder, she has been set up for formal neuropsychological testing in November 2022.  She is no longer operating a motor vehicle.  She does report occasional episodes of  soreness in the head that may come and go, she has had 4-5 such episodes since her surgery for the subdural hematoma.  She returns to this office for an evaluation.  REVIEW OF SYSTEMS: Out of a complete 14 system review of symptoms, the patient complains only of the following symptoms, and all other reviewed systems are negative.  See HPI  ALLERGIES: No Known Allergies  HOME MEDICATIONS: Outpatient Medications Prior to Visit  Medication Sig Dispense Refill   omeprazole (PRILOSEC) 40 MG capsule Take 40 mg by mouth 2 (two) times daily.     OXcarbazepine (TRILEPTAL) 150 MG tablet Take 1 tablet by mouth 2 (two) times daily.     thiamine 100 MG tablet Take 1 tablet by mouth daily.     Lacosamide 100 MG TABS Take 1 tablet (100 mg total) by mouth in the morning and at bedtime. 180 tablet 0   No facility-administered medications prior to visit.    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, requiring surgery   Memory deficit 05/20/2014   Partial symptomatic epilepsy with complex partial seizures, not intractable, without status epilepticus ([place]) 05/20/2014   most recent 09/10/20   Ventral hernia     PAST SURGICAL HISTORY: Past Surgical History:  Procedure Laterality Date   COLONOSCOPY WITH PROPOFOL N/A 12/08/2020   Procedure: COLONOSCOPY WITH PROPOFOL;  Surgeon: Jonathon Bellows, MD;  [facility] END[facility]COPY;  Service: Gastroenterology;  Laterality: N/A;   ESOPHAGOGASTRODUODENOSCOPY N/A 12/08/2020   Procedure: ESOPHAGOGASTRODUODENOSCOPY (EGD);  Surgeon: Jonathon Bellows, MD;  Location: [facility] ENDOSCOPY;  Service: Gastroenterology;  Laterality: N/A;   SUBDURAL HEMATOMA EVACUATION VIA CRANIOTOMY  01/2020   TUBAL LIGATION     VENTRAL HERNIA REPAIR      FAMILY HISTORY: Family History  Problem Relation Age of Onset   Arthritis  Mother    Cancer Brother        lung   Colon cancer Neg Hx    Esophageal cancer Neg Hx    Stomach cancer Neg Hx    Liver disease Neg Hx     SOCIAL HISTORY: Social History   Socioeconomic History   Marital status: Divorced    Spouse name: Not on file   Number of children: 3   Years of education: 18 years   Highest education level: Not on file  Occupational History   Occupation: unemployed  Tobacco Use   Smoking status: Never   Smokeless tobacco: Never  Substance and Sexual Activity   Alcohol use: No   Drug use: No   Sexual activity: Never  Other Topics Concern   Not on file  Social History Narrative   10/06/20 lives with dgtr   Patient is right handed.   Patient does not drink caffeine.   Social Determinants of Health   Financial Resource Strain: Not on file  Food Insecurity: Not on file  Transportation Needs: Not on file  Physical Activity: Not on file  Stress: Not on file  Social Connections: Not on file  Intimate Partner Violence: Not on file   PHYSICAL EXAM  Vitals:   02/28/21 1025  BP: 124/74  Pulse: 74  Weight: 165 lb (74.8 kg)  Height: 5\' 2"  (1.575 m)   Body mass index is 30.18 kg/m.  Generalized: Well developed, in no acute distress   Neurological examination  Mentation: Alert oriented to time, place, history taking. Follows all commands speech and language fluent Cranial nerve II-XII: Pupils were equal round reactive to light. Extraocular movements were full, visual field were full on confrontational test. Facial sensation and strength were normal. Head turning and shoulder shrug  were normal and symmetric. Motor: Good strength all extremities Sensory: Sensory testing is intact to soft touch on all 4 extremities. No evidence of extinction is noted.  Coordination: Cerebellar testing reveals good finger-nose-finger and heel-to-shin bilaterally.  Gait and station: Gait is normal, independent Reflexes: Deep tendon reflexes are symmetric and normal  bilaterally.   DIAGNOSTIC DATA (LABS, IMAGING, TESTING) - I reviewed patient records, labs, notes, testing and imaging myself where available.  Lab Results  Component Value Date   WBC 5.2 10/27/2020   HGB 13.6 10/27/2020   HCT 37.6 10/27/2020   MCV 94.0 10/27/2020   PLT 211 10/27/2020      Component Value Date/Time   NA 126 (L) 10/27/2020 1845   NA 135 10/06/2020 0951   K 4.5 10/27/2020 1845   CL 91 (L) 10/27/2020 1845   CO2 27 10/27/2020 1845   GLUCOSE 134 (H) 10/27/2020 1845   BUN 7 (L) 10/27/2020 1845   BUN 9 10/06/2020 0951   CREATININE 0.60 10/27/2020 1845   CALCIUM 9.4 10/27/2020 1845   PROT 8.3 (H) 10/27/2020 2127   PROT 7.4 10/06/2020 0951   ALBUMIN 4.0 10/27/2020 2127   ALBUMIN 4.2 10/06/2020 0951   AST 16 10/27/2020 2127   ALT 11 10/27/2020 2127   ALKPHOS 84 10/27/2020 2127   BILITOT 0.6 10/27/2020 2127  BILITOT <0.2 10/06/2020 0951   GFRNONAA >60 10/27/2020 1845   GFRAA 81 04/01/2019 1407   Lab Results  Component Value Date   CHOL 233 (H) 11/18/2012   HDL 41.10 11/18/2012   LDLDIRECT 178.0 11/18/2012   TRIG 62.0 11/18/2012   CHOLHDL 6 11/18/2012   Lab Results  Component Value Date   HGBA1C 6.5 11/18/2012   Lab Results  Component Value Date   VITAMINB12 510 10/06/2020   Lab Results  Component Value Date   TSH 1.080 10/06/2020   ASSESSMENT AND PLAN 76 y.o. year old female  has a past medical history of Diabetes mellitus without complication ([place]), Gastroesophageal reflux disease, History of subdural hematoma (post traumatic) (03/23/2020), Memory deficit (05/20/2014), Partial symptomatic epilepsy with complex partial seizures, not intractable, without status epilepticus ([place]) (05/20/2014), and Ventral hernia. here with:  1.  History of seizures, last was in November 2022 2.  History of subdural hematoma  -Ms. Bryk, has done well since last seizure, is well appearing today  -Continue Trileptal 300 mg twice daily -Continue Vimpat 100 mg twice  daily, was filled 01/18/21 3 month supply, daughter will notify when due again -Check routine labs today -EEG was normal in November 2022 -She is not to drive until she is seizure-free for 6 months -Call for seizure activity, otherwise follow-up in 6 months or sooner if needed, she will now be followed by Dr. Pecola Lawless, AGNP-C, DNP 02/28/2021, 10:30 AM [facility] [address] [place], [place] [address] [phone]

## 2021-02-28 ENCOUNTER — Other Ambulatory Visit: Payer: Self-pay

## 2021-02-28 ENCOUNTER — Ambulatory Visit (INDEPENDENT_AMBULATORY_CARE_PROVIDER_SITE_OTHER): Payer: Medicare Other | Admitting: Neurology

## 2021-02-28 ENCOUNTER — Encounter: Payer: Self-pay | Admitting: Neurology

## 2021-02-28 VITALS — BP 124/74 | HR 74 | Ht 62.0 in | Wt 165.0 lb

## 2021-02-28 DIAGNOSIS — G40209 Localization-related (focal) (partial) symptomatic epilepsy and epileptic syndromes with complex partial seizures, not intractable, without status epilepticus: Secondary | ICD-10-CM | POA: Diagnosis not present

## 2021-02-28 DIAGNOSIS — S065XAA Traumatic subdural hemorrhage with loss of consciousness status unknown, initial encounter: Secondary | ICD-10-CM

## 2021-02-28 MED ORDER — OXCARBAZEPINE 300 MG PO TABS: 300.0000 mg | ORAL_TABLET | Freq: Two times a day (BID) | ORAL | 3 refills | Status: DC

## 2021-02-28 NOTE — Patient Instructions (Signed)
Great to see you today Check labs  Continue current medications No driving until seizure free 6 months Call for seizures See you back in 6 months with Dr .April Manson

## 2021-03-03 LAB — COMPREHENSIVE METABOLIC PANEL
ALT: 6 IU/L (ref 0–32)
AST: 11 IU/L (ref 0–40)
Albumin/Globulin Ratio: 1.2 (ref 1.2–2.2)
Albumin: 4.1 g/dL (ref 3.7–4.7)
Alkaline Phosphatase: 103 IU/L (ref 44–121)
BUN/Creatinine Ratio: 9 — ABNORMAL LOW (ref 12–28)
BUN: 6 mg/dL — ABNORMAL LOW (ref 8–27)
Bilirubin Total: <0.2 mg/dL (ref 0.0–1.2)
CO2: 25 mmol/L (ref 20–29)
Calcium: 9.4 mg/dL (ref 8.7–10.3)
Chloride: 95 mmol/L — ABNORMAL LOW (ref 96–106)
Creatinine, Ser: 0.69 mg/dL (ref 0.57–1.00)
Globulin, Total: 3.4 g/dL (ref 1.5–4.5)
Glucose: 133 mg/dL — ABNORMAL HIGH (ref 70–99)
Potassium: 4 mmol/L (ref 3.5–5.2)
Sodium: 131 mmol/L — ABNORMAL LOW (ref 134–144)
Total Protein: 7.5 g/dL (ref 6.0–8.5)
eGFR: 91 mL/min/{1.73_m2} (ref >59)

## 2021-03-03 LAB — CBC WITH DIFFERENTIAL/PLATELET
Basophils Absolute: 0 10*3/uL (ref 0.0–0.2)
Basos: 1 %
EOS (ABSOLUTE): 0 10*3/uL (ref 0.0–0.4)
Eos: 1 %
Hematocrit: 37.6 % (ref 34.0–46.6)
Hemoglobin: 12.6 g/dL (ref 11.1–15.9)
Immature Grans (Abs): 0 10*3/uL (ref 0.0–0.1)
Immature Granulocytes: 0 %
Lymphocytes Absolute: 1.4 10*3/uL (ref 0.7–3.1)
Lymphs: 37 %
MCH: 32 pg (ref 26.6–33.0)
MCHC: 33.5 g/dL (ref 31.5–35.7)
MCV: 95 fL (ref 79–97)
Monocytes Absolute: 0.4 10*3/uL (ref 0.1–0.9)
Monocytes: 9 %
Neutrophils Absolute: 2.1 10*3/uL (ref 1.4–7.0)
Neutrophils: 52 %
Platelets: 240 10*3/uL (ref 150–450)
RBC: 3.94 x10E6/uL (ref 3.77–5.28)
RDW: 12.1 % (ref 11.7–15.4)
WBC: 3.9 10*3/uL (ref 3.4–10.8)

## 2021-03-03 LAB — 10-HYDROXYCARBAZEPINE: Oxcarbazepine SerPl-Mcnc: 13 ug/mL (ref 10–35)

## 2021-03-07 ENCOUNTER — Other Ambulatory Visit: Payer: Self-pay | Admitting: Neurology

## 2021-03-07 MED ORDER — LACOSAMIDE 150 MG PO TABS: 150.0000 mg | ORAL_TABLET | Freq: Two times a day (BID) | ORAL | 1 refills | Status: DC

## 2021-03-07 MED ORDER — OXCARBAZEPINE 150 MG PO TABS: 150.0000 mg | ORAL_TABLET | Freq: Two times a day (BID) | ORAL | 1 refills | Status: DC

## 2021-03-07 NOTE — Telephone Encounter (Signed)
Please call the patient, labs show low sodium level of 131, when it has otherwise been close to normal. The low sodium may be as result of Trileptal. I would recommend reducing the dose of Trileptal back to 150 mg twice daily, increase Vimpat to 150 mg twice daily. I reviewed this plan with Dr. April Manson. If she and her daughter are ok with this I will send in new scripts.

## 2021-03-07 NOTE — Telephone Encounter (Signed)
Provided results to her daughter, Andee Poles. She is agreeable to the recommended medication changes. New prescriptions will be authorized by Judson Roch. We will request the pharmacy to void refills on the other doses.

## 2021-03-07 NOTE — Addendum Note (Signed)
Addended by: Noberto Retort C on: 03/07/2021 02:43 PM   Modules accepted: Orders

## 2021-03-13 ENCOUNTER — Encounter: Payer: Self-pay | Admitting: Neurology

## 2021-03-23 ENCOUNTER — Ambulatory Visit: Payer: Medicare Other | Admitting: Neurology

## 2021-04-10 NOTE — Telephone Encounter (Signed)
Letter was sent - pt has rescheduled new pt visit ?

## 2021-04-20 ENCOUNTER — Ambulatory Visit (INDEPENDENT_AMBULATORY_CARE_PROVIDER_SITE_OTHER): Payer: Medicare Other | Admitting: Nurse Practitioner

## 2021-04-20 ENCOUNTER — Encounter: Payer: Self-pay | Admitting: Nurse Practitioner

## 2021-04-20 ENCOUNTER — Other Ambulatory Visit: Payer: Self-pay

## 2021-04-20 ENCOUNTER — Ambulatory Visit: Payer: No Typology Code available for payment source | Admitting: Nurse Practitioner

## 2021-04-20 VITALS — BP 130/72 | HR 90 | Temp 97.1°F | Ht 60.0 in | Wt 159.2 lb

## 2021-04-20 DIAGNOSIS — E1165 Type 2 diabetes mellitus with hyperglycemia: Secondary | ICD-10-CM | POA: Diagnosis not present

## 2021-04-20 DIAGNOSIS — E785 Hyperlipidemia, unspecified: Secondary | ICD-10-CM | POA: Diagnosis not present

## 2021-04-20 LAB — BASIC METABOLIC PANEL
BUN: 10 mg/dL (ref 6–23)
CO2: 30 mEq/L (ref 19–32)
Calcium: 9.9 mg/dL (ref 8.4–10.5)
Chloride: 100 mEq/L (ref 96–112)
Creatinine, Ser: 0.71 mg/dL (ref 0.40–1.20)
GFR: 83.36 mL/min (ref >60.00)
Glucose, Bld: 120 mg/dL — ABNORMAL HIGH (ref 70–99)
Potassium: 4.4 mEq/L (ref 3.5–5.1)
Sodium: 136 mEq/L (ref 135–145)

## 2021-04-20 LAB — LIPID PANEL
Cholesterol: 213 mg/dL — ABNORMAL HIGH (ref 0–200)
HDL: 45.8 mg/dL (ref >39.00)
LDL Cholesterol: 148 mg/dL — ABNORMAL HIGH (ref 0–99)
NonHDL: 167.31
Total CHOL/HDL Ratio: 5
Triglycerides: 98 mg/dL (ref 0.0–149.0)
VLDL: 19.6 mg/dL (ref 0.0–40.0)

## 2021-04-20 LAB — HEMOGLOBIN A1C: Hgb A1c MFr Bld: 6.8 % — ABNORMAL HIGH (ref 4.6–6.5)

## 2021-04-20 MED ORDER — BLOOD GLUCOSE MONITOR KIT: PACK | 0 refills | Status: AC

## 2021-04-20 NOTE — Patient Instructions (Signed)
Thank you for choosing [facility]. ? ?Go to lab for blood draw and urine collection. ? ?Check glucose 3x/week. Bring glucose reading to next appt ? ?Diabetes Mellitus and Nutrition, Adult ?When you have diabetes, or diabetes mellitus, it is very important to have healthy eating habits because your blood sugar (glucose) levels are greatly affected by what you eat and drink. Eating healthy foods in the right amounts, at about the same times every day, can help you: ?Manage your blood glucose. ?Lower your risk of heart disease. ?Improve your blood pressure. ?Reach or maintain a healthy weight. ?What can affect my meal plan? ?Every person with diabetes is different, and each person has different needs for a meal plan. Your health care provider may recommend that you work with a dietitian to make a meal plan that is best for you. Your meal plan may vary depending on factors such as: ?The calories you need. ?The medicines you take. ?Your weight. ?Your blood glucose, blood pressure, and cholesterol levels. ?Your activity level. ?Other health conditions you have, such as heart or kidney disease. ?How do carbohydrates affect me? ?Carbohydrates, also called carbs, affect your blood glucose level more than any other type of food. Eating carbs raises the amount of glucose in your blood. ?It is important to know how many carbs you can safely have in each meal. This is different for every person. Your dietitian can help you calculate how many carbs you should have at each meal and for each snack. ?How does alcohol affect me? ?Alcohol can cause a decrease in blood glucose (hypoglycemia), especially if you use insulin or take certain diabetes medicines by mouth. Hypoglycemia can be a life-threatening condition. Symptoms of hypoglycemia, such as sleepiness, dizziness, and confusion, are similar to symptoms of having too much alcohol. ?Do not drink alcohol if: ?Your health care provider tells you not to drink. ?You are  pregnant, may be pregnant, or are planning to become pregnant. ?If you drink alcohol: ?Limit how much you have to: ?0-1 drink a day for women. ?0-2 drinks a day for men. ?Know how much alcohol is in your drink. In the [place] one drink equals one 12 oz bottle of beer (355 mL), one 5 oz glass of wine (148 mL), or one 1? oz glass of hard liquor (44 mL). ?Keep yourself hydrated with water, diet soda, or unsweetened iced tea. Keep in mind that regular soda, juice, and other mixers may contain a lot of sugar and must be counted as carbs. ?What are tips for following this plan? ?Reading food labels ?Start by checking the serving size on the Nutrition Facts label of packaged foods and drinks. The number of calories and the amount of carbs, fats, and other nutrients listed on the label are based on one serving of the item. Many items contain more than one serving per package. ?Check the total grams (g) of carbs in one serving. ?Check the number of grams of saturated fats and trans fats in one serving. Choose foods that have a low amount or none of these fats. ?Check the number of milligrams (mg) of salt (sodium) in one serving. Most people should limit total sodium intake to less than 2,300 mg per day. ?Always check the nutrition information of foods labeled as "low-fat" or "nonfat." These foods may be higher in added sugar or refined carbs and should be avoided. ?Talk to your dietitian to identify your daily goals for nutrients listed on the label. ?Shopping ?Avoid buying canned, pre-made, or  processed foods. These foods tend to be high in fat, sodium, and added sugar. ?Shop around the outside edge of the grocery store. This is where you will most often find fresh fruits and vegetables, bulk grains, fresh meats, and fresh dairy products. ?Cooking ?Use low-heat cooking methods, such as baking, instead of high-heat cooking methods, such as deep frying. ?Cook using healthy oils, such as olive, canola, or sunflower oil. ?Avoid  cooking with butter, cream, or high-fat meats. ?Meal planning ?Eat meals and snacks regularly, preferably at the same times every day. Avoid going long periods of time without eating. ?Eat foods that are high in fiber, such as fresh fruits, vegetables, beans, and whole grains. ?Eat 4-6 oz (112-168 g) of lean protein each day, such as lean meat, chicken, fish, eggs, or tofu. One ounce (oz) (28 g) of lean protein is equal to: ?1 oz (28 g) of meat, chicken, or fish. ?1 egg. ?? cup (62 g) of tofu. ?Eat some foods each day that contain healthy fats, such as avocado, nuts, seeds, and fish. ?What foods should I eat? ?Fruits ?Berries. Apples. Oranges. Peaches. Apricots. Plums. Grapes. Mangoes. Papayas. Pomegranates. Kiwi. Cherries. ?Vegetables ?Leafy greens, including lettuce, spinach, kale, chard, collard greens, mustard greens, and cabbage. Beets. Cauliflower. Broccoli. Carrots. Green beans. Tomatoes. Peppers. Onions. Cucumbers. Brussels sprouts. ?Grains ?Whole grains, such as whole-wheat or whole-grain bread, crackers, tortillas, cereal, and pasta. Unsweetened oatmeal. Quinoa. Brown or wild rice. ?Meats and other proteins ?Seafood. Poultry without skin. Lean cuts of poultry and beef. Tofu. Nuts. Seeds. ?Dairy ?Low-fat or fat-free dairy products such as milk, yogurt, and cheese. ?The items listed above may not be a complete list of foods and beverages you can eat and drink. Contact a dietitian for more information. ?What foods should I avoid? ?Fruits ?Fruits canned with syrup. ?Vegetables ?Canned vegetables. Frozen vegetables with butter or cream sauce. ?Grains ?Refined white flour and flour products such as bread, pasta, snack foods, and cereals. Avoid all processed foods. ?Meats and other proteins ?Fatty cuts of meat. Poultry with skin. Breaded or fried meats. Processed meat. Avoid saturated fats. ?Dairy ?Full-fat yogurt, cheese, or milk. ?Beverages ?Sweetened drinks, such as soda or iced tea. ?The items listed above  may not be a complete list of foods and beverages you should avoid. Contact a dietitian for more information. ?Questions to ask a health care provider ?Do I need to meet with a certified diabetes care and education specialist? ?Do I need to meet with a dietitian? ?What number can I call if I have questions? ?When are the best times to check my blood glucose? ?Where to find more information: ?American Diabetes Association: diabetes.org ?Academy of Nutrition and Dietetics: eatright.org ?[employer] of Diabetes and Digestive and Kidney Diseases: AmenCredit.is ?Association of Diabetes Care & Education Specialists: diabeteseducator.org ?Summary ?It is important to have healthy eating habits because your blood sugar (glucose) levels are greatly affected by what you eat and drink. It is important to use alcohol carefully. ?A healthy meal plan will help you manage your blood glucose and lower your risk of heart disease. ?Your health care provider may recommend that you work with a dietitian to make a meal plan that is best for you. ?This information is not intended to replace advice given to you by your health care provider. Make sure you discuss any questions you have with your health care provider. ?Document Revised: 08/26/2019 Document Reviewed: 08/26/2019 ?Elsevier Patient Education ? [place]. ? ? ?Diabetes Mellitus and Foot Care ?Foot care  is an important part of your health, especially when you have diabetes. Diabetes may cause you to have problems because of poor blood flow (circulation) to your feet and legs, which can cause your skin to: ?Become thinner and drier. ?Break more easily. ?Heal more slowly. ?Peel and crack. ?You may also have nerve damage (neuropathy) in your legs and feet, causing decreased feeling in them. This means that you may not notice minor injuries to your feet that could lead to more serious problems. Noticing and addressing any potential problems early is the best way to  prevent future foot problems. ?How to care for your feet ?Foot hygiene ? ?Wash your feet daily with warm water and mild soap. Do not use hot water. Then, pat your feet and the areas between your toes until they

## 2021-04-20 NOTE — Assessment & Plan Note (Addendum)
No medication at this time. She does not think this is needed. No glucose check at home ?We discussed Diabetes Mellitus Disease Process and possible complications. The need for low carb/low sugar diet to help improve glucose control. She verbalized understanding. ? ?Check hgbA1c, lipid panel, BMP and urine microalbumin ?HgbA1c: 6.8% from 6.5%. start metformin '250mg'$  BID ?Abnormal lipid panel: ASCVD risk at 33.1%. start crestor '20mg'$  3x/week ?Entered referral to ophthalmology ?Completed foot exam ?Provided glucometer rx (check glucose 3x/week, alternate between AM and PM) ?F/up in 41month ?

## 2021-04-20 NOTE — Progress Notes (Addendum)
? ? ? ?New patient visit ? ?Patient: Sabrina Guerrero   DOB: 03/12/1945   76 y.o. Female  MRN: [id number] ?Visit Date: 04/21/2021 ? ?Subjective:  ?  ?Chief Complaint  ?Patient presents with  ? Establish Care  ?  DM and hyperlipidemia f/up  ? ?Sabrina Guerrero is a 76 y.o. female who presents today as a new patient to establish care.  ?HPI  ?Accompanied by daugther. ? ?Type 2 diabetes mellitus with hyperglycemia, without long-term current use of insulin (HCC) ?No medication at this time. She does not think this is needed. No glucose check at home ?We discussed Diabetes Mellitus Disease Process and possible complications. The need for low carb/low sugar diet to help improve glucose control. She verbalized understanding. ? ?Check hgbA1c, lipid panel, BMP and urine microalbumin ?HgbA1c: 6.8% from 6.5%. start metformin 250mg BID ?Abnormal lipid panel: ASCVD risk at 33.1%. start crestor 20mg 3x/week ?Entered referral to ophthalmology ?Completed foot exam ?Provided glucometer rx (check glucose 3x/week, alternate between AM and PM) ?F/up in 3months ? ?Dyslipidemia ?Repeat lipid panel ?Abnormal lipid panel: ASCVD risk at 33.1%. ? start crestor 20mg 3x/week ?Repeat lipid panel in 3months (fasting) ? ?Past Medical History:  ?Diagnosis Date  ? Diabetes mellitus without complication (HCC)   ? Gastroesophageal reflux disease   ? 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 Memory deficit 05/20/2014  ? Partial symptomatic epilepsy with complex partial seizures, not intractable, without status epilepticus (HCC) 05/20/2014  ? most recent 09/10/20  ? Ventral hernia   ? ?Past Surgical History:  ?Procedure Laterality Date  ? COLONOSCOPY WITH PROPOFOL N/A 12/08/2020  ? Procedure: COLONOSCOPY WITH PROPOFOL;  Surgeon: Anna, Kiran, MD;  Location: [facility];  Service: Gastroenterology;  Laterality: N/A;  ? ESOPHAGOGASTRODUODENOSCOPY N/A 12/08/2020  ? Procedure: ESOPHAGOGASTRODUODENOSCOPY (EGD);   Surgeon: Anna, Kiran, MD;  Location: [facility];  Service: Gastroenterology;  Laterality: N/A;  ? SUBDURAL HEMATOMA EVACUATION VIA CRANIOTOMY  01/2020  ? TUBAL LIGATION    ? VENTRAL HERNIA REPAIR    ? ?Social History  ? ?Tobacco Use  ? Smoking status: Never  ? Smokeless tobacco: Never  ?Vaping Use  ? Vaping Use: Never used  ?Substance Use Topics  ? Alcohol use: No  ? Drug use: No  ? ?Family Status  ?Relation Name Status  ? Mother  Alive  ? Brother  Deceased  ? Father  Deceased at age 42  ?     mva  ? Sister  Alive  ? Brother  Alive  ? Daughter  Alive  ? Son  Alive  ? Sister  Alive  ? Sister  Alive  ? Daughter  Alive  ? Neg Hx  (Not Specified)  ? ?Outpatient Medications Prior to Visit  ?Medication Sig  ? Lacosamide 150 MG TABS Take 1 tablet (150 mg total) by mouth 2 (two) times daily.  ? omeprazole (PRILOSEC) 40 MG capsule Take 40 mg by mouth 2 (two) times daily.  ? OXcarbazepine (TRILEPTAL) 150 MG tablet Take 1 tablet (150 mg total) by mouth 2 (two) times daily.  ? thiamine 100 MG tablet Take 1 tablet by mouth daily.  ? ?No facility-administered medications prior to visit.  ? ?No Known Allergies ? ?Patient Care Team: ?,  Lum, NP as PCP - General (Internal Medicine) ? ?Review of Systems: see HPI for ROS ? ?Last metabolic panel ?Lab Results  ?Component Value Date  ? GLUCOSE 120 (H) 04/20/2021  ? NA 136 04/20/2021  ?   ? ? ? ?New patient visit ? ?Patient: Sabrina Guerrero   DOB: 03/12/1945   76 y.o. Female  MRN: [id number] ?Visit Date: 04/21/2021 ? ?Subjective:  ?  ?Chief Complaint  ?Patient presents with  ? Establish Care  ?  DM and hyperlipidemia f/up  ? ?Sabrina Guerrero is a 76 y.o. female who presents today as a new patient to establish care.  ?HPI  ?Accompanied by daugther. ? ?Type 2 diabetes mellitus with hyperglycemia, without long-term current use of insulin (HCC) ?No medication at this time. She does not think this is needed. No glucose check at home ?We discussed Diabetes Mellitus Disease Process and possible complications. The need for low carb/low sugar diet to help improve glucose control. She verbalized understanding. ? ?Check hgbA1c, lipid panel, BMP and urine microalbumin ?HgbA1c: 6.8% from 6.5%. start metformin 250mg BID ?Abnormal lipid panel: ASCVD risk at 33.1%. start crestor 20mg 3x/week ?Entered referral to ophthalmology ?Completed foot exam ?Provided glucometer rx (check glucose 3x/week, alternate between AM and PM) ?F/up in 3months ? ?Dyslipidemia ?Repeat lipid panel ?Abnormal lipid panel: ASCVD risk at 33.1%. ? start crestor 20mg 3x/week ?Repeat lipid panel in 3months (fasting) ? ?Past Medical History:  ?Diagnosis Date  ? Diabetes mellitus without complication (HCC)   ? Gastroesophageal reflux disease   ? 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 Memory deficit 05/20/2014  ? Partial symptomatic epilepsy with complex partial seizures, not intractable, without status epilepticus (HCC) 05/20/2014  ? most recent 09/10/20  ? Ventral hernia   ? ?Past Surgical History:  ?Procedure Laterality Date  ? COLONOSCOPY WITH PROPOFOL N/A 12/08/2020  ? Procedure: COLONOSCOPY WITH PROPOFOL;  Surgeon: Anna, Kiran, MD;  Location: [facility];  Service: Gastroenterology;  Laterality: N/A;  ? ESOPHAGOGASTRODUODENOSCOPY N/A 12/08/2020  ? Procedure: ESOPHAGOGASTRODUODENOSCOPY (EGD);   Surgeon: Anna, Kiran, MD;  Location: [facility];  Service: Gastroenterology;  Laterality: N/A;  ? SUBDURAL HEMATOMA EVACUATION VIA CRANIOTOMY  01/2020  ? TUBAL LIGATION    ? VENTRAL HERNIA REPAIR    ? ?Social History  ? ?Tobacco Use  ? Smoking status: Never  ? Smokeless tobacco: Never  ?Vaping Use  ? Vaping Use: Never used  ?Substance Use Topics  ? Alcohol use: No  ? Drug use: No  ? ?Family Status  ?Relation Name Status  ? Mother  Alive  ? Brother  Deceased  ? Father  Deceased at age 42  ?     mva  ? Sister  Alive  ? Brother  Alive  ? Daughter  Alive  ? Son  Alive  ? Sister  Alive  ? Sister  Alive  ? Daughter  Alive  ? Neg Hx  (Not Specified)  ? ?Outpatient Medications Prior to Visit  ?Medication Sig  ? Lacosamide 150 MG TABS Take 1 tablet (150 mg total) by mouth 2 (two) times daily.  ? omeprazole (PRILOSEC) 40 MG capsule Take 40 mg by mouth 2 (two) times daily.  ? OXcarbazepine (TRILEPTAL) 150 MG tablet Take 1 tablet (150 mg total) by mouth 2 (two) times daily.  ? thiamine 100 MG tablet Take 1 tablet by mouth daily.  ? ?No facility-administered medications prior to visit.  ? ?No Known Allergies ? ?Patient Care Team: ?,  Lum, NP as PCP - General (Internal Medicine) ? ?Review of Systems: see HPI for ROS ? ?Last metabolic panel ?Lab Results  ?Component Value Date  ? GLUCOSE 120 (H) 04/20/2021  ? NA 136 04/20/2021  ?   ? ? ? ?New patient visit ? ?Patient: Sabrina Guerrero   DOB: 03/12/1945   76 y.o. Female  MRN: [id number] ?Visit Date: 04/21/2021 ? ?Subjective:  ?  ?Chief Complaint  ?Patient presents with  ? Establish Care  ?  DM and hyperlipidemia f/up  ? ?Sabrina Guerrero is a 76 y.o. female who presents today as a new patient to establish care.  ?HPI  ?Accompanied by daugther. ? ?Type 2 diabetes mellitus with hyperglycemia, without long-term current use of insulin (HCC) ?No medication at this time. She does not think this is needed. No glucose check at home ?We discussed Diabetes Mellitus Disease Process and possible complications. The need for low carb/low sugar diet to help improve glucose control. She verbalized understanding. ? ?Check hgbA1c, lipid panel, BMP and urine microalbumin ?HgbA1c: 6.8% from 6.5%. start metformin 250mg BID ?Abnormal lipid panel: ASCVD risk at 33.1%. start crestor 20mg 3x/week ?Entered referral to ophthalmology ?Completed foot exam ?Provided glucometer rx (check glucose 3x/week, alternate between AM and PM) ?F/up in 3months ? ?Dyslipidemia ?Repeat lipid panel ?Abnormal lipid panel: ASCVD risk at 33.1%. ? start crestor 20mg 3x/week ?Repeat lipid panel in 3months (fasting) ? ?Past Medical History:  ?Diagnosis Date  ? Diabetes mellitus without complication (HCC)   ? Gastroesophageal reflux disease   ? 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 Memory deficit 05/20/2014  ? Partial symptomatic epilepsy with complex partial seizures, not intractable, without status epilepticus (HCC) 05/20/2014  ? most recent 09/10/20  ? Ventral hernia   ? ?Past Surgical History:  ?Procedure Laterality Date  ? COLONOSCOPY WITH PROPOFOL N/A 12/08/2020  ? Procedure: COLONOSCOPY WITH PROPOFOL;  Surgeon: Anna, Kiran, MD;  Location: [facility];  Service: Gastroenterology;  Laterality: N/A;  ? ESOPHAGOGASTRODUODENOSCOPY N/A 12/08/2020  ? Procedure: ESOPHAGOGASTRODUODENOSCOPY (EGD);   Surgeon: Anna, Kiran, MD;  Location: [facility];  Service: Gastroenterology;  Laterality: N/A;  ? SUBDURAL HEMATOMA EVACUATION VIA CRANIOTOMY  01/2020  ? TUBAL LIGATION    ? VENTRAL HERNIA REPAIR    ? ?Social History  ? ?Tobacco Use  ? Smoking status: Never  ? Smokeless tobacco: Never  ?Vaping Use  ? Vaping Use: Never used  ?Substance Use Topics  ? Alcohol use: No  ? Drug use: No  ? ?Family Status  ?Relation Name Status  ? Mother  Alive  ? Brother  Deceased  ? Father  Deceased at age 42  ?     mva  ? Sister  Alive  ? Brother  Alive  ? Daughter  Alive  ? Son  Alive  ? Sister  Alive  ? Sister  Alive  ? Daughter  Alive  ? Neg Hx  (Not Specified)  ? ?Outpatient Medications Prior to Visit  ?Medication Sig  ? Lacosamide 150 MG TABS Take 1 tablet (150 mg total) by mouth 2 (two) times daily.  ? omeprazole (PRILOSEC) 40 MG capsule Take 40 mg by mouth 2 (two) times daily.  ? OXcarbazepine (TRILEPTAL) 150 MG tablet Take 1 tablet (150 mg total) by mouth 2 (two) times daily.  ? thiamine 100 MG tablet Take 1 tablet by mouth daily.  ? ?No facility-administered medications prior to visit.  ? ?No Known Allergies ? ?Patient Care Team: ?,  Lum, NP as PCP - General (Internal Medicine) ? ?Review of Systems: see HPI for ROS ? ?Last metabolic panel ?Lab Results  ?Component Value Date  ? GLUCOSE 120 (H) 04/20/2021  ? NA 136 04/20/2021  ?

## 2021-04-21 MED ORDER — ROSUVASTATIN CALCIUM 20 MG PO TABS: 20.0000 mg | ORAL_TABLET | ORAL | 3 refills | Status: DC

## 2021-04-21 MED ORDER — METFORMIN HCL 500 MG PO TABS
250.0000 mg | ORAL_TABLET | Freq: Two times a day (BID) | ORAL | 3 refills | Status: DC
Start: 2021-04-21 — End: 2021-10-12

## 2021-04-21 NOTE — Addendum Note (Signed)
Addended by: Wilfred Lacy L on: 04/21/2021 03:01 PM ? ? Modules accepted: Orders ? ?

## 2021-04-21 NOTE — Assessment & Plan Note (Signed)
Repeat lipid panel ?Abnormal lipid panel: ASCVD risk at 33.1%. ? start crestor '20mg'$  3x/week ?Repeat lipid panel in 37month (fasting) ?

## 2021-04-24 ENCOUNTER — Telehealth: Payer: Self-pay | Admitting: Nurse Practitioner

## 2021-04-24 DIAGNOSIS — E1165 Type 2 diabetes mellitus with hyperglycemia: Secondary | ICD-10-CM

## 2021-04-24 NOTE — Telephone Encounter (Signed)
-----   Message from Arcelia Jew, [place] sent at 04/21/2021  3:36 PM EDT ----- ?Pt daughter ([employer] approved) notified and verbalized understanding. Agrees to medications and referral.  ?

## 2021-04-25 ENCOUNTER — Ambulatory Visit: Payer: No Typology Code available for payment source | Admitting: Nurse Practitioner

## 2021-04-25 ENCOUNTER — Encounter: Payer: Medicare Other | Attending: Nurse Practitioner | Admitting: Dietician

## 2021-04-25 ENCOUNTER — Encounter: Payer: Self-pay | Admitting: Dietician

## 2021-04-25 ENCOUNTER — Other Ambulatory Visit: Payer: Self-pay

## 2021-04-25 DIAGNOSIS — E1165 Type 2 diabetes mellitus with hyperglycemia: Secondary | ICD-10-CM | POA: Insufficient documentation

## 2021-04-25 NOTE — Addendum Note (Signed)
Addended by: Beryle Lathe S on: 04/25/2021 10:31 AM ? ? Modules accepted: Orders ? ?

## 2021-04-25 NOTE — Patient Instructions (Addendum)
Check your blood sugar each morning before eating or drinking (fasting). ?Look for numbers between 70-100 mg/dL ?Check your blood sugar 2 hours after you begin eating a meal. ?Look for numbers under 180 mg/dL at all times. ?Your goal A1c is below 6.5% ? ?Work towards eating three meals a day, about 5-6 hours apart! ? ?Begin to build your meals using the proportions of the Balanced Plate. ?First, select your carb choice(s) for the meal. Make this 25% of your meal. ?Next, select your source of protein to pair with your carb choice(s). Make this another 25% of your meal. ?Finally, complete your meal with a variety of non-starchy vegetables. Make this the remaining 50% of your meal. ? ?Consider buying divided plates to build your meals like they are on your handout! ? ?Keep up the consistency with your walks!! ?

## 2021-04-25 NOTE — Progress Notes (Signed)
Diabetes Self-Management Education ? ?Visit Type: First/Initial ? ?Appt. Start Time: 1020 Appt. End Time: 1130 ? ?04/25/2021 ? ?Ms. Sabrina Guerrero, identified by name and date of birth, is a 76 y.o. female with a diagnosis of Diabetes: Type 2.  ? ?ASSESSMENT ?Pt daughter Sabrina Guerrero is present during this appointment. Pt short term memory is impaired from a fall in 2021, daughter is caretaker. ?Pt is a [employer] day [employer], no pork, no alcohol, no coffee, no shellfish. ?Pt has been prescribed metformin and a meter, will begin checking BG 3x a week fasting and taking metformin BID. ?RD provided sample meter and instruction for daughter how to check blood sugar.  ?CBG during visit : 169 (Pt ate breakfast ~2.5 hours before checking) ?OneTouch VerioFlex ?Lot#: [id number] X ?Exp Date: 07/05/2025 ?Pt reports feeling low energy when eating baked goods or high sugar foods. Daughter tries to give pt juices without added sugars. Pt reports having communal meals on the weekends where they feel that they overeat carbohydrate. Pt reports that they will skip a meal if they eat a heavy meal before, usually dinner. Pt has switched to almond milk from regular milk. Pt reports only drinking 1-2 bottles of water a day. Pt reports taking short walks in the afternoon. ? ?There were no vitals taken for this visit. ?There is no height or weight on file to calculate BMI. ? ? Diabetes Self-Management Education - 04/25/21 1040   ? ?  ? Visit Information  ? Visit Type First/Initial   ?  ? Initial Visit  ? Diabetes Type Type 2   ? Are you currently following a meal plan? No   ? Are you taking your medications as prescribed? --   Will start this morning.  ? Date Diagnosed 04/20/2021   ?  ? Health Coping  ? How would you rate your overall health? Good   ?  ? Psychosocial Assessment  ? Patient Belief/Attitude about Diabetes Motivated to manage diabetes   ? Self-care barriers Debilitated state due to current medical condition   ? Self-management support  Doctor's office;Family   ? Other persons present Patient;Family Member   ? Patient Concerns Nutrition/Meal planning   ? Special Needs None   ? Preferred Learning Style Visual   ? Learning Readiness Ready   ? How often do you need to have someone help you when you read instructions, pamphlets, or other written materials from your doctor or pharmacy? 1 - Never   ? What is the last grade level you completed in school? College   ?  ? Pre-Education Assessment  ? Patient understands the diabetes disease and treatment process. Needs Instruction   ? Patient understands incorporating nutritional management into lifestyle. Needs Instruction   ? Patient undertands incorporating physical activity into lifestyle. Needs Instruction   ? Patient understands using medications safely. Needs Instruction   ? Patient understands monitoring blood glucose, interpreting and using results Needs Instruction   ? Patient understands prevention, detection, and treatment of acute complications. Needs Instruction   ? Patient understands prevention, detection, and treatment of chronic complications. Needs Instruction   ? Patient understands how to develop strategies to address psychosocial issues. Needs Instruction   ? Patient understands how to develop strategies to promote health/change behavior. Needs Instruction   ?  ? Complications  ? Last HgB A1C per patient/outside source 6.8 %   04/20/2021  ? How often do you check your blood sugar? 0 times/day (not testing)   Pt will start testing today  ?  Postprandial Blood glucose range (mg/dL) 130-179   Checked CBG during visit  ? Number of hypoglycemic episodes per month 0   ? Number of hyperglycemic episodes per week 0   ? Have you had a dilated eye exam in the past 12 months? No   ? Have you had a dental exam in the past 12 months? No   ? Are you checking your feet? No   ?  ? Dietary Intake  ? Breakfast Shredded wheat w/ almond milk   ? Lunch Creamy chicken, collard greens   ? Dinner Grapes   ?  Beverage(s) water, grape juice, minute maid   ?  ? Exercise  ? Exercise Type ADL's;Light (walking / raking leaves)   ? How many days per week to you exercise? 5   ? How many minutes per day do you exercise? 20   ? Total minutes per week of exercise 100   ?  ? Patient Education  ? Previous Diabetes Education No   ? Disease state  Explored patient's options for treatment of their diabetes   ? Nutrition management  Role of diet in the treatment of diabetes and the relationship between the three main macronutrients and blood glucose level   ? Physical activity and exercise  Role of exercise on diabetes management, blood pressure control and cardiac health.   ? Medications Reviewed patients medication for diabetes, action, purpose, timing of dose and side effects.   ? Monitoring Taught/evaluated SMBG meter.;Identified appropriate SMBG and/or A1C goals.   ? Chronic complications Relationship between chronic complications and blood glucose control   ? Psychosocial adjustment Worked with patient to identify barriers to care and solutions;Helped patient identify a support system for diabetes management   ? Personal strategies to promote health Lifestyle issues that need to be addressed for better diabetes care   ?  ? Individualized Goals (developed by patient)  ? Nutrition Follow meal plan discussed   ? Physical Activity Exercise 3-5 times per week   Take breif walks in the afternoon  ? Medications take my medication as prescribed   ? Monitoring  test my blood glucose as discussed   ? Health Coping ask for help with (comment)   meal prep and medications from your daughter  ?  ? Post-Education Assessment  ? Patient understands the diabetes disease and treatment process. Needs Review   ? Patient understands incorporating nutritional management into lifestyle. Needs Review   ? Patient undertands incorporating physical activity into lifestyle. Needs Review   ? Patient understands using medications safely. Needs Review   ?  Patient understands monitoring blood glucose, interpreting and using results Needs Review   ? Patient understands prevention, detection, and treatment of acute complications. Needs Review   ? Patient understands prevention, detection, and treatment of chronic complications. Needs Review   ? Patient understands how to develop strategies to address psychosocial issues. Needs Review   ? Patient understands how to develop strategies to promote health/change behavior. Needs Review   ?  ? Outcomes  ? Expected Outcomes Demonstrated limited interest in learning.  Expect minimal changes   Hindered cognitive ability  ? Future DMSE 3-4 months   ? Program Status Not Completed   ? ?  ?  ? ?  ? ? ?Individualized Plan for Diabetes Self-Management Training:  ? ?Learning Objective:  Patient will have a greater understanding of diabetes self-management. ?Patient education plan is to attend individual and/or group sessions per assessed needs and concerns. ?  ?Plan:  ? ?  Patient Instructions  ?Check your blood sugar each morning before eating or drinking (fasting). ?Look for numbers between 70-100 mg/dL ?Check your blood sugar 2 hours after you begin eating a meal. ?Look for numbers under 180 mg/dL at all times. ?Your goal A1c is below 6.5% ? ?Work towards eating three meals a day, about 5-6 hours apart! ? ?Begin to build your meals using the proportions of the Balanced Plate. ?First, select your carb choice(s) for the meal. Make this 25% of your meal. ?Next, select your source of protein to pair with your carb choice(s). Make this another 25% of your meal. ?Finally, complete your meal with a variety of non-starchy vegetables. Make this the remaining 50% of your meal. ? ?Consider buying divided plates to build your meals like they are on your handout! ? ?Keep up the consistency with your walks!! ? ?Expected Outcomes:  Demonstrated limited interest in learning.  Expect minimal changes (Hindered cognitive ability) ? ?Education material  provided: My Plate, Food List ? ?If problems or questions, patient to contact team via:  Phone and Email ? ?Future DSME appointment: 3-4 months ?

## 2021-04-27 ENCOUNTER — Other Ambulatory Visit: Payer: Self-pay | Admitting: Neurology

## 2021-06-15 ENCOUNTER — Telehealth: Payer: Self-pay | Admitting: Nurse Practitioner

## 2021-06-15 NOTE — Telephone Encounter (Signed)
Left message for patient to call back and schedule Medicare Annual Wellness Visit (AWV). Please offer to do virtually or by telephone.  Left office number and my jabber #[phone]. ? ?Due for AWVI ? ?Please schedule at anytime with Nurse Health Advisor. ?  ?

## 2021-06-22 ENCOUNTER — Telehealth: Payer: Self-pay | Admitting: Neurology

## 2021-06-22 NOTE — Telephone Encounter (Signed)
I reviewed [place] drug registry and 1 refill remains on file for this med refill.  I called the pharmacy and she was able to confirm 1 refill remains on file med will be process and pt will be called when ready for pick up.  Daughter updated on this as well.

## 2021-06-22 NOTE — Telephone Encounter (Signed)
Pt's daughter, Maliah Pyles request refill for Lacosamide 150 MG TABS at [id number]

## 2021-07-05 ENCOUNTER — Ambulatory Visit: Payer: Medicare Other

## 2021-07-05 ENCOUNTER — Telehealth: Payer: Self-pay

## 2021-07-05 NOTE — Telephone Encounter (Signed)
Called patient x3 on all available contact numbers and no voice mail available.Patient may reschedule for the next available appointment.  L.Olanda Downie,LPN

## 2021-07-07 NOTE — Telephone Encounter (Signed)
I spoke to patient's daughter and she rescheduled appointment for AWV to 07/11/21.

## 2021-07-11 ENCOUNTER — Ambulatory Visit (INDEPENDENT_AMBULATORY_CARE_PROVIDER_SITE_OTHER): Payer: Medicare Other

## 2021-07-11 DIAGNOSIS — Z0000 Encounter for general adult medical examination without abnormal findings: Secondary | ICD-10-CM

## 2021-07-11 NOTE — Progress Notes (Signed)
Subjective:   Sabrina Guerrero is a 76 y.o. female who presents for an Initial Medicare Annual Wellness Visit.   I connected with Markiya Keefe  today by telephone and verified that I am speaking with the correct person using two identifiers. Location patient: home Location provider: work Persons participating in the virtual visit: patient, provider.   I discussed the limitations, risks, security and privacy concerns of performing an evaluation and management service by telephone and the availability of in person appointments. I also discussed with the patient that there may be a patient responsible charge related to this service. The patient expressed understanding and verbally consented to this telephonic visit.    Interactive audio and video telecommunications were attempted between this provider and patient, however failed, due to patient having technical difficulties OR patient did not have access to video capability.  We continued and completed visit with audio only.    Review of Systems     Cardiac Risk Factors include: advanced age (>32mn, >>40women)     Objective:    Today's Vitals   There is no height or weight on file to calculate BMI.     07/11/2021   10:54 AM 04/25/2021   10:30 AM 12/08/2020    8:05 AM 10/27/2020    6:42 PM 04/13/2020    7:07 PM 04/08/2020    1:07 PM 12/04/2019   10:32 PM  Advanced Directives  Does Patient Have a Medical Advance Directive? _0  No No  Would patient like information on creating a medical advance directive? No - Patient declined No - Patient declined No - Patient declined No - Patient declined  No - Patient declined     Current Medications (verified) Outpatient Encounter Medications as of 07/11/2021  Medication Sig   blood glucose meter kit and supplies KIT Dispense based on patient and insurance preference. Check glucose daily, E11.65   Lacosamide 150 MG TABS Take 1 tablet (150 mg total) by mouth 2 (two) times daily.    metFORMIN (GLUCOPHAGE) 500 MG tablet Take 0.5 tablets (250 mg total) by mouth 2 (two) times daily with a meal.   omeprazole (PRILOSEC) 40 MG capsule Take 40 mg by mouth 2 (two) times daily.   OXcarbazepine (TRILEPTAL) 150 MG tablet Take 1 tablet (150 mg total) by mouth 2 (two) times daily.   rosuvastatin (CRESTOR) 20 MG tablet Take 1 tablet (20 mg total) by mouth 3 (three) times a week.   thiamine 100 MG tablet Take 1 tablet by mouth daily.   No facility-administered encounter medications on file as of 07/11/2021.    Allergies (verified) Patient has no known allergies.   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, requiring surgery   Memory deficit 05/20/2014   Partial symptomatic epilepsy with complex partial seizures, not intractable, [place]ptic[place] 05/20/2014   most recent 09/10/20   Ventral hernia    Past Surgical History:  Procedure Laterality Date   COLONOSCOPY WITH PROPOFOL N/A 12/08/2020   Procedure: COLONOSCOPY WITH PROPOFOL;  Surgeon: AJonathon Bellows MD;  [facility]  Service: Gastroenterology;  Laterality: N/A;   ESOPHAGOGASTRODUODENOSCOPY N/A 12/08/2020   Procedure: ESOPHAGOGASTRODUODENOSCOPY (EGD);  Surgeon: AJonathon Bellows MD;  [facility];  Service: Gastroenterology;  Laterality: N/A;   SUBDURAL HEMATOMA EVACUATION VIA CRANIOTOMY  01/2020   TUBAL LIGATION  VENTRAL HERNIA REPAIR     Family History  Problem Relation Age of Onset   Arthritis Mother    Cancer Brother        lung   Colon cancer Neg Hx    Esophageal cancer Neg Hx    Stomach cancer Neg Hx    Liver disease Neg Hx    Social History   Socioeconomic History   Marital status: Divorced    Spouse name: Not on file   Number of children: 3   Years of education: 18 years   Highest education level: Not on file   Occupational History   Occupation: unemployed  Tobacco Use   Smoking status: Never   Smokeless tobacco: Never  Vaping Use   Vaping Use: Never used  Substance and Sexual Activity   Alcohol use: No   Drug use: No   Sexual activity: Never  Other Topics Concern   Not on file  Social History Narrative   10/06/20 lives with dgtr   Patient is right handed.   Patient does not drink caffeine.   Social Determinants of Health   Financial Resource Strain: Low Risk    Difficulty of Paying Living Expenses: Not hard at all  Food Insecurity: No Food Insecurity   Worried About Charity fundraiser in the Last Year: Never true   [place] in the Last Year: Never true  Transportation Needs: No Transportation Needs   Lack of Transportation (Medical): No   Lack of Transportation (Non-Medical): No  Physical Activity: Sufficiently Active   Days of Exercise per Week: 5 days   Minutes of Exercise per Session: 30 min  Stress: No Stress Concern Present   Feeling of Stress : Not at all  Social Connections: Moderately Isolated   Frequency of Communication with Friends and Family: Three times a week   Frequency of Social Gatherings with Friends and Family: Three times a week   Attends Religious Services: More than 4 times per year   Active Member of Clubs or Organizations: No   Attends Archivist Meetings: Never   Marital Status: Divorced    Tobacco Counseling Counseling given: Not Answered   Clinical Intake:  Pre-visit preparation completed: Yes  Pain : No/denies pain     Nutritional Risks: None Diabetes: Yes CBG done?: No Did pt. bring in CBG monitor from home?: No  How often do you need to have someone help you when you read instructions, pamphlets, or other written materials from your doctor or pharmacy?: 1 - Never What is the last grade level you completed in school?: college  Diabetic?yes Nutrition Risk Assessment:  Has the patient had any N/V/D within the last  2 months?  No  Does the patient have any non-healing wounds?  No  Has the patient had any unintentional weight loss or weight gain?  No   Diabetes:  Is the patient diabetic?  Yes  If diabetic, was a CBG obtained today?  No  Did the patient bring in their glucometer from home?  No  How often do you monitor your CBG's? Daily .   Financial Strains and Diabetes Management:  Are you having any financial strains with the device, your supplies or your medication? No .  Does the patient want to be seen by Chronic Care Management for management of their diabetes?  No  Would the patient like to be referred to a Nutritionist or for Diabetic Management?  No   Diabetic Exams:  Diabetic Eye  Exam: Overdue for diabetic eye exam. Pt has been advised about the importance in completing this exam. Patient advised to call and schedule an eye exam. Diabetic Foot Exam: Overdue, Pt has been advised about the importance in completing this exam. Pt is scheduled for diabetic foot exam on next office visit .   Interpreter Needed?: No  Information entered by :: [email or web address]   Activities of Daily Living    07/11/2021   10:58 AM  In your present state of health, do you have any difficulty performing the following activities:  Hearing? 0  Vision? 0  Difficulty concentrating or making decisions? 0  Walking or climbing stairs? 0  Dressing or bathing? 0  Doing errands, shopping? 0  Preparing Food and eating ? N  Using the Toilet? N  In the past six months, have you accidently leaked urine? N  Do you have problems with loss of bowel control? N  Managing your Medications? N  Managing your Finances? N  Housekeeping or managing your Housekeeping? N    Patient Care Team: Nche, Charlene Brooke, NP as PCP - General (Internal Medicine)  Indicate any recent Medical Services you may have received from other than [facility] providers in the past year (date may be approximate).     Assessment:   This is a routine  wellness examination for Sunny.  Hearing/Vision screen Vision Screening - Comments:: Annual eye exams   Dietary issues and exercise activities discussed: Current Exercise Habits: Home exercise routine, Type of exercise: walking, Time (Minutes): 30, Frequency (Times/Week): 5, Weekly Exercise (Minutes/Week): 150, Intensity: Mild, Exercise limited by: None identified   Goals Addressed   None    Depression Screen    07/11/2021   10:56 AM 07/11/2021   10:52 AM 04/25/2021   10:31 AM 04/20/2021   11:03 AM 11/18/2012   10:13 AM  PHQ 2/9 Scores  PHQ - 2 Score 0 0 0 2 0  PHQ- 9 Score    2     Fall Risk    07/11/2021   10:55 AM 04/25/2021   10:30 AM  Fall Risk   Falls in the past year? 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 0   Follow up Falls evaluation completed     [place]:  Any stairsYes  in or around the home?  If so, are there any without handrails? No  Home free of loose throw rugs in walkways, pet beds, electrical cords, etc? Yes  Adequate lighting in your home to reduce risk of falls? Yes   ASSISTIVE DEVICES UTILIZED TO PREVENT FALLS:  Life alert? No  Use of a cane, walker or w/c? No  Grab bars in the bathroom? No  Shower chair or bench in shower? Yes  Elevated toilet seat or a handicapped toilet? No    Cognitive Function:Normal cognitive status assessed by telephone conversation  by this Nurse Health Advisor. No abnormalities found.      10/06/2020    9:47 AM 11/19/2014    8:14 AM 05/20/2014    8:06 AM  MMSE - Mini Mental State Exam  Orientation to time _0 Orientation to Place _1 Registration _2 Attention/ Calculation _3 Recall _4 Language- name 2 objects _5 Language- repeat 0 1 1  Language- follow 3 step command  _0 Language- read & follow direction _1 Write a sentence _2 Copy design _3 Total score _4 Immunizations Immunization History  Administered Date(s) Administered   PFIZER(Purple Top)SARS-COV-2 Vaccination 09/01/2019, 09/22/2019    TDAP status: Due, Education has been provided regarding the importance of this vaccine. Advised may receive this vaccine at local pharmacy or Health Dept. Aware to provide a copy of the vaccination record if obtained from local pharmacy or Health Dept. Verbalized acceptance and understanding.  Flu Vaccine status: Due, Education has been provided regarding the importance of this vaccine. Advised may receive this vaccine at local pharmacy or Health Dept. Aware to provide a copy of the vaccination record if obtained from local pharmacy or Health Dept. Verbalized acceptance and understanding.  Pneumococcal vaccine status: Due, Education has been provided regarding the importance of this vaccine. Advised may receive this vaccine at local pharmacy or Health Dept. Aware to provide a copy of the vaccination record if obtained from local pharmacy or Health Dept. Verbalized acceptance and understanding.  Covid-19 vaccine status: Completed vaccines  Qualifies for Shingles Vaccine? Yes   Zostavax completed No   Shingrix Completed?: No.    Education has been provided regarding the importance of this vaccine. Patient has been advised to call insurance company to determine out of pocket expense if they have not yet received this vaccine. Advised may also receive vaccine at local pharmacy or Health Dept. Verbalized acceptance and understanding.  Screening Tests Health Maintenance  Topic Date Due   OPHTHALMOLOGY EXAM  Never done   URINE MICROALBUMIN  Never done   Hepatitis C Screening  Never done   DEXA SCAN  Never done   COVID-19 Vaccine (3 - Booster for Pfizer series) 11/17/2019   Zoster Vaccines- Shingrix (1 of 2) 07/21/2021 (Originally 03/16/1996)   Pneumonia Vaccine 74+ Years old (1 - PCV) 04/21/2022 (Originally 03/16/1951)   TETANUS/TDAP  04/21/2022 (Originally  03/16/1964)   INFLUENZA VACCINE  09/05/2021   HEMOGLOBIN A1C  10/21/2021   FOOT EXAM  04/21/2022   COLONOSCOPY (Pts 45-106yr Insurance coverage will need to be confirmed)  12/09/2030   HPV VACCINES  Aged Out    Health Maintenance  Health Maintenance Due  Topic Date Due   OPHTHALMOLOGY EXAM  Never done   URINE MICROALBUMIN  Never done   Hepatitis C Screening  Never done   DEXA SCAN  Never done   COVID-19 Vaccine (3 - Booster for Pfizer series) 11/17/2019    Colorectal cancer screening: No longer required.   Mammogram status: No longer required due to age.  Bone Density status: Ordered will have completed [facility]. Pt provided with contact info and advised to call to schedule appt.  Lung Cancer Screening: (Low Dose CT Chest recommended if Age 75-80years, 30 pack-year currently smoking OR have quit w/in 15years.) does not qualify.   Lung Cancer Screening Referral: N/A  Additional Screening:  Hepatitis C Screening: does not qualify;   Vision Screening: Recommended annual ophthalmology exams for early detection of glaucoma and other disorders of the eye. Is the patient up to date with their annual eye exam?  Yes  Who is the provider or what is the name of the office in which the patient attends annual eye exams? [facility]  If pt is not established with a provider, would they like to be referred to a provider to establish care? No .  Dental Screening: Recommended annual dental exams for proper oral hygiene  Community Resource Referral / Chronic Care Management: CRR required this visit?  No   CCM required this visit?  No      Plan:     I have personally reviewed and noted the following in the patient's chart:   Medical and social history Use of alcohol, tobacco or illicit drugs  Current medications and supplements including opioid prescriptions. Patient is not currently taking opioid prescriptions. Functional ability and status Nutritional status Physical activity Advanced  directives List of other physicians Hospitalizations, surgeries, and ER visits in previous 12 months Vitals Screenings to include cognitive, depression, and falls Referrals and appointments  In addition, I have reviewed and discussed with patient certain preventive protocols, quality metrics, and best practice recommendations. A written personalized care plan for preventive services as well as general preventive health recommendations were provided to patient.     Randel Pigg, LPN   03/15/9789   Nurse Notes: Marlynn Perking

## 2021-07-11 NOTE — Patient Instructions (Signed)
Ms. Sabrina Guerrero , Thank you for taking time to come for your Medicare Wellness Visit. I appreciate your ongoing commitment to your health goals. Please review the following plan we discussed and let me know if I can assist you in the future.   Screening recommendations/referrals: Colonoscopy: no longer required  Mammogram: no longer required  Bone Density: will obtain [facility]  Recommended yearly ophthalmology/optometry visit for glaucoma screening and checkup Recommended yearly dental visit for hygiene and checkup  Vaccinations: Influenza vaccine: due  Pneumococcal vaccine: due  Tdap vaccine: due  Shingles vaccine: will consider     Advanced directives: none   Conditions/risks identified: none   Next appointment: none    Preventive Care 59 Years and Older, Female Preventive care refers to lifestyle choices and visits with your health care provider that can promote health and wellness. What does preventive care include? A yearly physical exam. This is also called an annual well check. Dental exams once or twice a year. Routine eye exams. Ask your health care provider how often you should have your eyes checked. Personal lifestyle choices, including: Daily care of your teeth and gums. Regular physical activity. Eating a healthy diet. Avoiding tobacco and drug use. Limiting alcohol use. Practicing safe sex. Taking low-dose aspirin every day. Taking vitamin and mineral supplements as recommended by your health care provider. What happens during an annual well check? The services and screenings done by your health care provider during your annual well check will depend on your age, overall health, lifestyle risk factors, and family history of disease. Counseling  Your health care provider may ask you questions about your: Alcohol use. Tobacco use. Drug use. Emotional well-being. Home and relationship well-being. Sexual activity. Eating habits. History of falls. Memory and ability  to understand (cognition). Work and work Statistician. Reproductive health. Screening  You may have the following tests or measurements: Height, weight, and BMI. Blood pressure. Lipid and cholesterol levels. These may be checked every 5 years, or more frequently if you are over 44 years old. Skin check. Lung cancer screening. You may have this screening every year starting at age 40 if you have a 30-pack-year history of smoking and currently smoke or have quit within the past 15 years. Fecal occult blood test (FOBT) of the stool. You may have this test every year starting at age 29. Flexible sigmoidoscopy or colonoscopy. You may have a sigmoidoscopy every 5 years or a colonoscopy every 10 years starting at age 42. Hepatitis C blood test. Hepatitis B blood test. Sexually transmitted disease (STD) testing. Diabetes screening. This is done by checking your blood sugar (glucose) after you have not eaten for a while (fasting). You may have this done every 1-3 years. Bone density scan. This is done to screen for osteoporosis. You may have this done starting at age 78. Mammogram. This may be done every 1-2 years. Talk to your health care provider about how often you should have regular mammograms. Talk with your health care provider about your test results, treatment options, and if necessary, the need for more tests. Vaccines  Your health care provider may recommend certain vaccines, such as: Influenza vaccine. This is recommended every year. Tetanus, diphtheria, and acellular pertussis (Tdap, Td) vaccine. You may need a Td booster every 10 years. Zoster vaccine. You may need this after age 17. Pneumococcal 13-valent conjugate (PCV13) vaccine. One dose is recommended after age 39. Pneumococcal polysaccharide (PPSV23) vaccine. One dose is recommended after age 77. Talk to your health care provider  about which screenings and vaccines you need and how often you need them. This information is not  intended to replace advice given to you by your health care provider. Make sure you discuss any questions you have with your health care provider. Document Released: 02/18/2015 Document Revised: 10/12/2015 Document Reviewed: 11/23/2014 Elsevier Interactive Patient Education  2017 [employer] Prevention in the Home Falls can cause injuries. They can happen to people of all ages. There are many things you can do to make your home safe and to help prevent falls. What can I do on the outside of my home? Regularly fix the edges of walkways and driveways and fix any cracks. Remove anything that might make you trip as you walk through a door, such as a raised step or threshold. Trim any bushes or trees on the path to your home. Use bright outdoor lighting. Clear any walking paths of anything that might make someone trip, such as rocks or tools. Regularly check to see if handrails are loose or broken. Make sure that both sides of any steps have handrails. Any raised decks and porches should have guardrails on the edges. Have any leaves, snow, or ice cleared regularly. Use sand or salt on walking paths during winter. Clean up any spills in your garage right away. This includes oil or grease spills. What can I do in the bathroom? Use night lights. Install grab bars by the toilet and in the tub and shower. Do not use towel bars as grab bars. Use non-skid mats or decals in the tub or shower. If you need to sit down in the shower, use a plastic, non-slip stool. Keep the floor dry. Clean up any water that spills on the floor as soon as it happens. Remove soap buildup in the tub or shower regularly. Attach bath mats securely with double-sided non-slip rug tape. Do not have throw rugs and other things on the floor that can make you trip. What can I do in the bedroom? Use night lights. Make sure that you have a light by your bed that is easy to reach. Do not use any sheets or blankets that are  too big for your bed. They should not hang down onto the floor. Have a firm chair that has side arms. You can use this for support while you get dressed. Do not have throw rugs and other things on the floor that can make you trip. What can I do in the kitchen? Clean up any spills right away. Avoid walking on wet floors. Keep items that you use a lot in easy-to-reach places. If you need to reach something above you, use a strong step stool that has a grab bar. Keep electrical cords out of the way. Do not use floor polish or wax that makes floors slippery. If you must use wax, use non-skid floor wax. Do not have throw rugs and other things on the floor that can make you trip. What can I do with my stairs? Do not leave any items on the stairs. Make sure that there are handrails on both sides of the stairs and use them. Fix handrails that are broken or loose. Make sure that handrails are as long as the stairways. Check any carpeting to make sure that it is firmly attached to the stairs. Fix any carpet that is loose or worn. Avoid having throw rugs at the top or bottom of the stairs. If you do have throw rugs, attach them to the floor  with carpet tape. Make sure that you have a light switch at the top of the stairs and the bottom of the stairs. If you do not have them, ask someone to add them for you. What else can I do to help prevent falls? Wear shoes that: Do not have high heels. Have rubber bottoms. Are comfortable and fit you well. Are closed at the toe. Do not wear sandals. If you use a stepladder: Make sure that it is fully opened. Do not climb a closed stepladder. Make sure that both sides of the stepladder are locked into place. Ask someone to hold it for you, if possible. Clearly mark and make sure that you can see: Any grab bars or handrails. First and last steps. Where the edge of each step is. Use tools that help you move around (mobility aids) if they are needed. These  include: Canes. Walkers. Scooters. Crutches. Turn on the lights when you go into a dark area. Replace any light bulbs as soon as they burn out. Set up your furniture so you have a clear path. Avoid moving your furniture around. If any of your floors are uneven, fix them. If there are any pets around you, be aware of where they are. Review your medicines with your doctor. Some medicines can make you feel dizzy. This can increase your chance of falling. Ask your doctor what other things that you can do to help prevent falls. This information is not intended to replace advice given to you by your health care provider. Make sure you discuss any questions you have with your health care provider. Document Released: 11/18/2008 Document Revised: 06/30/2015 Document Reviewed: 02/26/2014 Elsevier Interactive Patient Education  2017 [employer].

## 2021-07-21 ENCOUNTER — Ambulatory Visit: Payer: Medicare Other | Admitting: Nurse Practitioner

## 2021-07-21 ENCOUNTER — Telehealth: Payer: Self-pay | Admitting: Nurse Practitioner

## 2021-07-21 NOTE — Telephone Encounter (Signed)
No show letter sent.

## 2021-07-25 ENCOUNTER — Ambulatory Visit: Payer: Medicare Other | Admitting: Dietician

## 2021-07-31 ENCOUNTER — Ambulatory Visit: Payer: Medicare Other | Admitting: Registered"

## 2021-08-03 ENCOUNTER — Other Ambulatory Visit: Payer: Self-pay | Admitting: Nurse Practitioner

## 2021-08-03 NOTE — Telephone Encounter (Signed)
Chart supports Rx Last OV: 04/2021 Next OV: not scheduled   

## 2021-08-09 ENCOUNTER — Telehealth: Payer: Self-pay | Admitting: Nurse Practitioner

## 2021-08-09 NOTE — Telephone Encounter (Signed)
Please advise 

## 2021-08-09 NOTE — Telephone Encounter (Signed)
Pt has a cream for pain called Thermacare that she really likes and it helps. The pharmacy told her she needs a script for this. She has requested to have this filled.

## 2021-08-10 NOTE — Telephone Encounter (Signed)
Left detailed VM, adv pt cream is OTC she is to call back if she has any further questions.

## 2021-08-16 LAB — HM DIABETES FOOT EXAM: HM Diabetic Foot Exam: NORMAL

## 2021-08-29 ENCOUNTER — Encounter: Payer: Self-pay | Admitting: Neurology

## 2021-08-29 ENCOUNTER — Ambulatory Visit (INDEPENDENT_AMBULATORY_CARE_PROVIDER_SITE_OTHER): Payer: Medicare Other | Admitting: Neurology

## 2021-08-29 VITALS — BP 116/70 | HR 67 | Ht 60.0 in | Wt 152.0 lb

## 2021-08-29 DIAGNOSIS — G40209 Localization-related (focal) (partial) symptomatic epilepsy and epileptic syndromes with complex partial seizures, not intractable, without status epilepticus: Secondary | ICD-10-CM

## 2021-08-29 DIAGNOSIS — Z5181 Encounter for therapeutic drug level monitoring: Secondary | ICD-10-CM

## 2021-08-29 NOTE — Progress Notes (Signed)
PATIENT: Sabrina Guerrero DOB: [birth year]  REASON FOR VISIT: Follow up for seizures HISTORY FROM: Patient and her daughter  PRIMARY NEUROLOGIST: Dr. April Manson  HISTORY OF PRESENT ILLNESS: Today 08/29/21 Patient presents today for follow-up, she is accompanied by her daughter.  Last visit was in January and since then she denies any additional seizures or seizure-like activity.  She is currently on Vimpat 100 mg twice daily and Trileptal 300 mg twice daily, denies any side effect of the medication and reported tolerance.  INTERVAL HISTORY 02/28/21:  [name] here today for follow-up with history of seizures.  Had about 3 seizures in 2022, at last visit with Dr. Jannifer Franklin, Trileptal was increased to 300 mg twice daily. EEG was normal in November 2022.  Vimpat was added in November 2022 due to breakthrough seizures. 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 December 2021 had brain surgery for SDH. She lives with her daughter. Walks 20 minutes almost daily. No falls.   HISTORY  10/06/20 Dr. Jannifer Franklin: [name] is a 76 year old right-handed black female with a history of diabetes and a history of seizures.  The patient has been well controlled on a very low-dose of Trileptal taking 150 mg twice daily for a number of years.  Within the last year, she did have a fall and developed bilateral subdural hematoma requiring surgery.  She had fallen in October 2021, presented to the hospital on 01 February 2020 with gait instability and confusion.  She lives with her daughter who has noted that over the last several months she has sustained 3 witnessed seizure events.  The patient would not remember whether or not she had a seizure if  she were alone.  The patient will have a 10 to 15-minute episode of staring off and associated confusion with gradual return to normal baseline.  The patient has developed a memory disorder, she has been set up for formal neuropsychological testing in November 2022.  She is no longer operating a motor vehicle.  She does report occasional episodes of soreness in the head that may come and go, she has had 4-5 such episodes since her surgery for the subdural hematoma.  She returns to this office for an evaluation.  REVIEW OF SYSTEMS: Out of a complete 14 system review of symptoms, the patient complains only of the following symptoms, and all other reviewed systems are negative.  See HPI  ALLERGIES: No Known Allergies  HOME MEDICATIONS: Outpatient Medications Prior to Visit  Medication Sig Dispense Refill   blood glucose meter kit and supplies KIT Dispense based on patient and insurance preference. Check glucose daily, E11.65 1 each 0   glucose blood (ACCU-CHEK GUIDE) test strip TEST DAILY 100 strip 2   Lacosamide 150 MG TABS Take 1 tablet (150 mg total) by mouth 2 (two) times daily. 180 tablet 1   metFORMIN (GLUCOPHAGE) 500 MG tablet Take 0.5 tablets (250 mg total) by mouth 2 (two) times daily with a meal. 90 tablet 3   omeprazole (PRILOSEC) 40 MG capsule Take 40 mg by mouth 2 (two) times daily.     OXcarbazepine (TRILEPTAL) 150 MG tablet Take 1 tablet (150 mg total) by mouth 2 (two) times daily. 180 tablet 1   rosuvastatin (CRESTOR) 20 MG tablet Take 1  tablet (20 mg total) by mouth 3 (three) times a week. 36 tablet 3   thiamine 100 MG tablet Take 1 tablet by mouth daily.     No facility-administered medications prior to visit.    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, requiring surgery   Memory deficit 05/20/2014    Partial symptomatic epilepsy with complex partial seizures, not intractable, without status epilepticus ([place]) 05/20/2014   most recent 09/10/20   Ventral hernia     PAST SURGICAL HISTORY: Past Surgical History:  Procedure Laterality Date   COLONOSCOPY WITH PROPOFOL N/A 12/08/2020   Procedure: COLONOSCOPY WITH PROPOFOL;  Surgeon: Jonathon Bellows, MD;  Location: [facility] ENDOSCOPY;  Service: Gastroenterology;  Laterality: N/A;   ESOPHAGOGASTRODUODENOSCOPY N/A 12/08/2020   Procedure: ESOPHAGOGASTRODUODENOSCOPY (EGD);  Surgeon: Jonathon Bellows, MD;  Location[facility] ENDOSCOPY;  Service: Gastroenterology;  Laterality: N/A;   SUBDURAL HEMATOMA EVACUATION VIA CRANIOTOMY  01/2020   TUBAL LIGATION     VENTRAL HERNIA REPAIR      FAMILY HISTORY: Family History  Problem Relation Age of Onset   Arthritis Mother    Cancer Brother        lung   Colon cancer Neg Hx    Esophageal cancer Neg Hx    Stomach cancer Neg Hx    Liver disease Neg Hx     SOCIAL HISTORY: Social History   Socioeconomic History   Marital status: Divorced    Spouse name: Not on file   Number of children: 3   Years of education: 18 years   Highest education level: Not on file  Occupational History   Occupation: unemployed  Tobacco Use   Smoking status: Never   Smokeless tobacco: Never  Vaping Use   Vaping Use: Never used  Substance and Sexual Activity   Alcohol use: No   Drug use: No   Sexual activity: Never  Other Topics Concern   Not on file  Social History Narrative   10/06/20 lives with dgtr   Patient is right handed.   Patient does not drink caffeine.   Social Determinants of Health   Financial Resource Strain: Low Risk  (07/11/2021)   Overall Financial Resource Strain (CARDIA)    Difficulty of Paying Living Expenses: Not hard at all  Food Insecurity: No Food Insecurity (07/11/2021)   Hunger Vital Sign    Worried About Running Out of Food in the Last Year: Never true    Ran Out of Food in the Last Year: Never true   Transportation Needs: No Transportation Needs (07/11/2021)   PRAPARE - Hydrologist (Medical): No    Lack of Transportation (Non-Medical): No  Physical Activity: Sufficiently Active (07/11/2021)   Exercise Vital Sign    Days of Exercise per Week: 5 days    Minutes of Exercise per Session: 30 min  Stress: No Stress Concern Present (07/11/2021)   [place]    Feeling of Stress : Not at all  Social Connections: Moderately Isolated (07/11/2021)   Social Connection and Isolation Panel [NHANES]    Frequency of Communication with Friends and Family: Three times a week    Frequency of Social Gatherings with Friends and Family: Three times a week    Attends Religious Services: More than 4 times per year  Active Member of Clubs or Organizations: No    Attends Archivist Meetings: Never    Marital Status: Divorced  Human resources officer Violence: Not At Risk (07/11/2021)   Humiliation, Afraid, Rape, and Kick questionnaire    Fear of Current or Ex-Partner: No    Emotionally Abused: No    Physically Abused: No    Sexually Abused: No   PHYSICAL EXAM  Vitals:   08/29/21 1022  BP: 116/70  Pulse: 67  Weight: 152 lb (68.9 kg)  Height: 5' (1.524 m)   Body mass index is 29.69 kg/m.  Generalized: Well developed, in no acute distress   Neurological examination  Mentation: Alert oriented to time, place, history taking. Follows all commands speech and language fluent Cranial nerve II-XII: Pupils were equal round reactive to light. Extraocular movements were full, visual field were full on confrontational test. Facial sensation and strength were normal. Head turning and shoulder shrug  were normal and symmetric. Motor: Good strength all extremities Sensory: Sensory testing is intact to soft touch on all 4 extremities. No evidence of extinction is noted.  Coordination: Cerebellar testing reveals good  finger-nose-finger and heel-to-shin bilaterally.  Gait and station: Gait is normal, independent Reflexes: Deep tendon reflexes are symmetric and normal bilaterally.   DIAGNOSTIC DATA (LABS, IMAGING, TESTING) - I reviewed patient records, labs, notes, testing and imaging myself where available.  Lab Results  Component Value Date   WBC 3.9 02/28/2021   HGB 12.6 02/28/2021   HCT 37.6 02/28/2021   MCV 95 02/28/2021   PLT 240 02/28/2021      Component Value Date/Time   NA 136 04/20/2021 1109   NA 131 (L) 02/28/2021 1049   K 4.4 04/20/2021 1109   CL 100 04/20/2021 1109   CO2 30 04/20/2021 1109   GLUCOSE 120 (H) 04/20/2021 1109   BUN 10 04/20/2021 1109   BUN 6 (L) 02/28/2021 1049   CREATININE 0.71 04/20/2021 1109   CALCIUM 9.9 04/20/2021 1109   PROT 7.5 02/28/2021 1049   ALBUMIN 4.1 02/28/2021 1049   AST 11 02/28/2021 1049   ALT 6 02/28/2021 1049   ALKPHOS 103 02/28/2021 1049   BILITOT <0.2 02/28/2021 1049   GFRNONAA >60 10/27/2020 1845   GFRAA 81 04/01/2019 1407   Lab Results  Component Value Date   CHOL 213 (H) 04/20/2021   HDL 45.80 04/20/2021   LDLCALC 148 (H) 04/20/2021   LDLDIRECT 178.0 11/18/2012   TRIG 98.0 04/20/2021   CHOLHDL 5 04/20/2021   Lab Results  Component Value Date   HGBA1C 6.8 (H) 04/20/2021   Lab Results  Component Value Date   VITAMINB12 510 10/06/2020   Lab Results  Component Value Date   TSH 1.080 10/06/2020   ASSESSMENT AND PLAN 76 y.o. year old female  has a past medical history of Diabetes mellitus without complication ([place]), Gastroesophageal reflux disease, History of subdural hematoma (post traumatic) (03/23/2020), Memory deficit (05/20/2014), Partial symptomatic epilepsy with complex partial seizures, not intractable, without status epilepticus ([place]) (05/20/2014), and Ventral hernia. here with:  1.  History of seizures, last was in November 2022 2.  History of subdural hematoma  -Continue Trileptal 300 mg twice daily -Continue  Vimpat 100 mg twice daily -Will check ASM level today with BMP -Call for seizure activity, otherwise follow-up in 6 months with NP    Alric Ran, MD 08/29/2021, 11:00 AM [facility] [address], [place], [place] [address] [phone]

## 2021-08-29 NOTE — Patient Instructions (Signed)
Continue your current medications  Will check Lacosamide and Oxcarbazepine level today. Follow up in 6 months

## 2021-08-31 LAB — BASIC METABOLIC PANEL
BUN/Creatinine Ratio: 12 (ref 12–28)
BUN: 8 mg/dL (ref 8–27)
CO2: 25 mmol/L (ref 20–29)
Calcium: 9.5 mg/dL (ref 8.7–10.3)
Chloride: 95 mmol/L — ABNORMAL LOW (ref 96–106)
Creatinine, Ser: 0.66 mg/dL (ref 0.57–1.00)
Glucose: 65 mg/dL — ABNORMAL LOW (ref 70–99)
Potassium: 4.4 mmol/L (ref 3.5–5.2)
Sodium: 134 mmol/L (ref 134–144)
eGFR: 91 mL/min/{1.73_m2} (ref >59)

## 2021-08-31 LAB — 10-HYDROXYCARBAZEPINE: Oxcarbazepine SerPl-Mcnc: 7 ug/mL — ABNORMAL LOW (ref 10–35)

## 2021-08-31 LAB — LACOSAMIDE: Lacosamide: 11.2 ug/mL — ABNORMAL HIGH (ref 5.0–10.0)

## 2021-09-04 ENCOUNTER — Ambulatory Visit (INDEPENDENT_AMBULATORY_CARE_PROVIDER_SITE_OTHER): Payer: Medicare Other | Admitting: Nurse Practitioner

## 2021-09-04 ENCOUNTER — Encounter: Payer: Self-pay | Admitting: Nurse Practitioner

## 2021-09-04 VITALS — BP 138/80 | HR 65 | Temp 98.1°F | Ht 60.0 in | Wt 150.6 lb

## 2021-09-04 DIAGNOSIS — M79604 Pain in right leg: Secondary | ICD-10-CM

## 2021-09-04 DIAGNOSIS — E1165 Type 2 diabetes mellitus with hyperglycemia: Secondary | ICD-10-CM

## 2021-09-04 DIAGNOSIS — Z780 Asymptomatic menopausal state: Secondary | ICD-10-CM | POA: Diagnosis not present

## 2021-09-04 LAB — POCT GLYCOSYLATED HEMOGLOBIN (HGB A1C): Hemoglobin A1C: 6.5 % — AB (ref 4.0–5.6)

## 2021-09-04 NOTE — Assessment & Plan Note (Addendum)
Improved hgbA1c: 6.8 to 6.5% Maintain metformin dose Pending Dm eye exam and urin microalbumin

## 2021-09-04 NOTE — Progress Notes (Signed)
Established Patient Visit  Patient: Sabrina Guerrero   DOB: 19-Dec-1945   76 y.o. Female  MRN: [id number] Visit Date: 09/04/2021  Subjective:    Chief Complaint  Patient presents with   Acute Visit    C/o of RT leg pain x 4 months , states that it "comes & goes" New Hernia above where she had her hernia repair.    Leg Pain  The incident occurred more than 1 week ago. There was no injury mechanism. The pain is present in the right leg. The quality of the pain is described as aching. The pain is mild. The pain has been Intermittent since onset. Pertinent negatives include no inability to bear weight, loss of motion, loss of sensation, muscle weakness, numbness or tingling. She reports no foreign bodies present. Nothing aggravates the symptoms. Treatments tried: topical cream. The treatment provided significant relief.   Type 2 diabetes mellitus with hyperglycemia, without long-term current use of insulin (HCC) Improved hgbA1c: 6.8 to 6.5% Maintain metformin dose  Reviewed medical, surgical, and social history today  Medications: Outpatient Medications Prior to Visit  Medication Sig   blood glucose meter kit and supplies KIT Dispense based on patient and insurance preference. Check glucose daily, E11.65   glucose blood (ACCU-CHEK GUIDE) test strip TEST DAILY   Lacosamide 150 MG TABS Take 1 tablet (150 mg total) by mouth 2 (two) times daily.   metFORMIN (GLUCOPHAGE) 500 MG tablet Take 0.5 tablets (250 mg total) by mouth 2 (two) times daily with a meal.   omeprazole (PRILOSEC) 40 MG capsule Take 40 mg by mouth 2 (two) times daily.   OXcarbazepine (TRILEPTAL) 150 MG tablet Take 1 tablet (150 mg total) by mouth 2 (two) times daily.   rosuvastatin (CRESTOR) 20 MG tablet Take 1 tablet (20 mg total) by mouth 3 (three) times a week.   thiamine 100 MG tablet Take 1 tablet by mouth daily.   No facility-administered medications prior to visit.   Reviewed past medical and social  history.   ROS per HPI above      Objective:  BP 138/80 (BP Location: Right Arm, Patient Position: Sitting, Cuff Size: Normal)   Pulse 65   Temp 98.1 F (36.7 C) (Temporal)   Ht 5' (1.524 m)   Wt 150 lb 9.6 oz (68.3 kg)   SpO2 99%   BMI 29.41 kg/m      Physical Exam Vitals reviewed.  Cardiovascular:     Rate and Rhythm: Normal rate.     Pulses: Normal pulses.  Pulmonary:     Effort: Pulmonary effort is normal.  Abdominal:     General: Bowel sounds are normal.     Palpations: Abdomen is soft.     Tenderness: There is no abdominal tenderness.     Hernia: A hernia is present. Hernia is present in the ventral area.  Musculoskeletal:        General: No swelling, tenderness or signs of injury. Normal range of motion.     Right lower leg: No edema.     Left lower leg: No edema.  Neurological:     Mental Status: She is alert and oriented to person, place, and time.  Psychiatric:        Mood and Affect: Mood normal.        Behavior: Behavior normal.     Results for orders placed or performed in visit on 09/04/21  POCT  glycosylated hemoglobin (Hb A1C)  Result Value Ref Range   Hemoglobin A1C 6.5 (A) 4.0 - 5.6 %      Assessment & Plan:    Problem List Items Addressed This Visit       Endocrine   Type 2 diabetes mellitus with hyperglycemia, without long-term current use of insulin (HCC) - Primary    Improved hgbA1c: 6.8 to 6.5% Maintain metformin dose      Relevant Orders   POCT glycosylated hemoglobin (Hb A1C) (Completed)   AMB Referral to Community Care Coordinaton   Urine microalbumin-creatinine with uACR   Other Visit Diagnoses     Right leg pain       Asymptomatic age-related postmenopausal state       Relevant Orders   DG Bone Density     Advised to continue use of OTC topical cream as needed for pain. Abdominal Hernia is soft and causes to discomfort or nausea or ABD pain or constipation, hence no additional intervention at this time.  Return in  about 3 months (around 12/05/2021) for DM and HTN, hyperlipidemia (fasting).     Wilfred Lacy, NP

## 2021-09-04 NOTE — Patient Instructions (Addendum)
If unable to find thermacare cream, try voltaren gel or biofreeze gel for leg pain as needed.  Improved hgbA1c: 6.8 to 6.5% Maintain metformin dose  Go to lab for urine collection.

## 2021-09-05 LAB — MICROALBUMIN / CREATININE URINE RATIO
Creatinine,U: 45.9 mg/dL
Microalb Creat Ratio: 2.3 mg/g (ref 0.0–30.0)
Microalb, Ur: 1 mg/dL (ref 0.0–1.9)

## 2021-09-07 ENCOUNTER — Encounter: Payer: Self-pay | Admitting: Nurse Practitioner

## 2021-09-07 NOTE — Telephone Encounter (Signed)
2nd no show, fee generated (no fee for Medicaid), final warning letter sent

## 2021-09-12 ENCOUNTER — Ambulatory Visit
Admission: RE | Admit: 2021-09-12 | Discharge: 2021-09-12 | Disposition: A | Discharge status: Home / Self Care | Payer: Medicare Other | Source: Ambulatory Visit | Attending: Nurse Practitioner | Admitting: Nurse Practitioner

## 2021-09-12 DIAGNOSIS — Z780 Asymptomatic menopausal state: Secondary | ICD-10-CM

## 2021-09-12 DIAGNOSIS — M85832 Other specified disorders of bone density and structure, left forearm: Secondary | ICD-10-CM | POA: Diagnosis not present

## 2021-09-19 ENCOUNTER — Other Ambulatory Visit: Payer: Self-pay | Admitting: Neurology

## 2021-09-24 ENCOUNTER — Other Ambulatory Visit: Payer: Self-pay

## 2021-09-24 ENCOUNTER — Emergency Department
Admission: EM | Admit: 2021-09-24 | Discharge: 2021-09-24 | Discharge status: Against Medical Advice | Payer: No Typology Code available for payment source | Attending: Emergency Medicine | Admitting: Emergency Medicine

## 2021-09-24 DIAGNOSIS — R42 Dizziness and giddiness: Secondary | ICD-10-CM | POA: Diagnosis present

## 2021-09-24 DIAGNOSIS — Z5321 Procedure and treatment not carried out due to patient leaving prior to being seen by health care provider: Secondary | ICD-10-CM | POA: Diagnosis not present

## 2021-09-24 LAB — CBC
HCT: 35.3 % — ABNORMAL LOW (ref 36.0–46.0)
Hemoglobin: 11.9 g/dL — ABNORMAL LOW (ref 12.0–15.0)
MCH: 31.8 pg (ref 26.0–34.0)
MCHC: 33.7 g/dL (ref 30.0–36.0)
MCV: 94.4 fL (ref 80.0–100.0)
Platelets: 224 10*3/uL (ref 150–400)
RBC: 3.74 MIL/uL — ABNORMAL LOW (ref 3.87–5.11)
RDW: 11.9 % (ref 11.5–15.5)
WBC: 4.9 10*3/uL (ref 4.0–10.5)
nRBC: 0 % (ref 0.0–0.2)

## 2021-09-24 LAB — COMPREHENSIVE METABOLIC PANEL
ALT: 10 U/L (ref 0–44)
AST: 20 U/L (ref 15–41)
Albumin: 3.9 g/dL (ref 3.5–5.0)
Alkaline Phosphatase: 66 U/L (ref 38–126)
Anion gap: 9 (ref 5–15)
BUN: 7 mg/dL — ABNORMAL LOW (ref 8–23)
CO2: 25 mmol/L (ref 22–32)
Calcium: 9 mg/dL (ref 8.9–10.3)
Chloride: 94 mmol/L — ABNORMAL LOW (ref 98–111)
Creatinine, Ser: 0.59 mg/dL (ref 0.44–1.00)
GFR, Estimated: >60 mL/min (ref >60)
Glucose, Bld: 145 mg/dL — ABNORMAL HIGH (ref 70–99)
Potassium: 3.8 mmol/L (ref 3.5–5.1)
Sodium: 128 mmol/L — ABNORMAL LOW (ref 135–145)
Total Bilirubin: 0.7 mg/dL (ref 0.3–1.2)
Total Protein: 7.7 g/dL (ref 6.5–8.1)

## 2021-09-24 LAB — TROPONIN I (HIGH SENSITIVITY): Troponin I (High Sensitivity): 4 ng/L (ref <18)

## 2021-09-24 NOTE — ED Triage Notes (Signed)
Pt states after she eats she feels lightheaded and feels like she is going to pass out. Pt denies headache, sensation of "fullness" in head. Pt denies chest pain, shob.

## 2021-09-25 ENCOUNTER — Encounter: Payer: Self-pay | Admitting: Neurology

## 2021-09-25 ENCOUNTER — Other Ambulatory Visit: Payer: Self-pay | Admitting: Neurology

## 2021-09-25 MED ORDER — LACOSAMIDE 150 MG PO TABS
150.0000 mg | ORAL_TABLET | Freq: Two times a day (BID) | ORAL | 1 refills | Status: DC
Start: 2021-09-25 — End: 2021-12-19

## 2021-09-25 NOTE — Addendum Note (Signed)
Addended by: Verlin Grills on: 09/25/2021 03:01 PM   Modules accepted: Orders

## 2021-09-25 NOTE — Telephone Encounter (Addendum)
[place] drug registry has been verified. Last refill was 06/22/2021 # 180 for a 90 day supply.   Pt has been consistent with her f/u and is currently out of her medication.

## 2021-09-25 NOTE — Telephone Encounter (Signed)
Pt is requesting a refill for Lacosamide 150 MG TABS.  Pharmacy:  [id number]

## 2021-10-03 ENCOUNTER — Telehealth: Payer: Self-pay | Admitting: *Deleted

## 2021-10-03 NOTE — Chronic Care Management (AMB) (Signed)
  Chronic Care Management   Note  10/03/2021 Name: FINDLEY BLANKENBAKER MRN: [id number] DOB: Apr 28, 1945  [name] is a 76 y.o. year old female who is a primary care patient of Nche, Charlene Brooke, NP. I reached out to [name] by phone today in response to a referral sent by Ms. Alanis T Rowland's PCP.  Ms. Culmer was given information about Chronic Care Management services today including:  CCM service includes personalized support from designated clinical staff supervised by her physician, including individualized plan of care and coordination with other care providers 24/7 contact phone numbers for assistance for urgent and routine care needs. Service will only be billed when office clinical staff spend 20 minutes or more in a month to coordinate care. Only one practitioner may furnish and bill the service in a calendar month. The patient may stop CCM services at any time (effective at the end of the month) by phone call to the office staff. The patient is responsible for co-pay (up to 20% after annual deductible is met) if co-pay is required by the individual health plan.   Patient agreed to services and verbal consent obtained.   Follow up plan: Telephone appointment with care management team member scheduled for: 10/12/2021  Julian Hy, [place] Direct Dial: [phone]

## 2021-10-10 ENCOUNTER — Telehealth: Payer: Self-pay

## 2021-10-10 NOTE — Progress Notes (Signed)
    Chronic Care Management Pharmacy Assistant   Name: Sabrina Guerrero  MRN: [id number] DOB: November 09, 1945  Chart Review for the clinical pharmacist on 10/12/2021 at 1:00 pm  Conditions to be addressed/monitored: HTN, HLD, DMII, Anxiety, GERD, and Seizure disorder, Trapezius muscle spasm,Memory deficit.  Primary concerns for visit include: None ID   Recent office visits:  09/04/2021 Wilfred Lacy NP (PCP) No medication changes noted, AMB Referral to [place], Return in about 3 months  07/11/2021 Randel Pigg LPN (PCP Office) Medicare Wellness completed, No medication Changes noted 04/20/2021 Wilfred Lacy NP (PCP) start Metformin 250 mg 2 times daily, Start Rosuvastatin 20 mg 3 times weekly, Ambulatory referral to Ophthalmology, Return in about 3 months   Recent consult visits:  08/29/2021 Dr. April Manson MD (Neurology) No medication Changes noted, follow-up in 6 months  04/25/2021 Berlinda Last RD ([place]) No Medication Changes noted    Hospital visits:  Medication Reconciliation was completed by comparing discharge summary, patient's EMR and Pharmacy list, and upon discussion with patient.  Admitted to the hospital on 09/24/2021 due to Dizziness. Discharge date was 09/24/2021. Discharged from [facility]?Medications Started at [facility] Discharge:?? -None ID  Medication Changes at Hospital Discharge: -None ID  Medications Discontinued at Hospital Discharge: -None ID  Medications that remain the same after Hospital Discharge:??  -All other medications will remain the same.    Questions for Clinical Pharmacist:   Left Voice message to do initial question prior to patient appointment on 10/12/2021 for CCM at 1:00 pm with Junius Argyle the Clinical pharmacist.   Medications: Outpatient Encounter Medications as of 10/10/2021  Medication Sig   blood glucose meter kit and supplies KIT Dispense based on patient and insurance preference. Check  glucose daily, E11.65   glucose blood (ACCU-CHEK GUIDE) test strip TEST DAILY   Lacosamide 150 MG TABS Take 1 tablet (150 mg total) by mouth 2 (two) times daily.   metFORMIN (GLUCOPHAGE) 500 MG tablet Take 0.5 tablets (250 mg total) by mouth 2 (two) times daily with a meal.   omeprazole (PRILOSEC) 40 MG capsule Take 40 mg by mouth 2 (two) times daily.   OXcarbazepine (TRILEPTAL) 150 MG tablet Take 1 tablet (150 mg total) by mouth 2 (two) times daily.   rosuvastatin (CRESTOR) 20 MG tablet Take 1 tablet (20 mg total) by mouth 3 (three) times a week.   thiamine 100 MG tablet Take 1 tablet by mouth daily.   No facility-administered encounter medications on file as of 10/10/2021.    Care Gaps: COVID-19 Vaccine Influenza Vaccine  Star Rating Drugs: Metformin 500 mg last filled on 07/20/2021 [facility]  Rosuvastatin 20 mg last filled on 10/07/2021 [facility].   [place] Pharmacist Assistant [phone]

## 2021-10-12 ENCOUNTER — Ambulatory Visit (INDEPENDENT_AMBULATORY_CARE_PROVIDER_SITE_OTHER): Payer: Medicare Other

## 2021-10-12 DIAGNOSIS — K219 Gastro-esophageal reflux disease without esophagitis: Secondary | ICD-10-CM

## 2021-10-12 DIAGNOSIS — E1165 Type 2 diabetes mellitus with hyperglycemia: Secondary | ICD-10-CM

## 2021-10-12 DIAGNOSIS — E1169 Type 2 diabetes mellitus with other specified complication: Secondary | ICD-10-CM

## 2021-10-12 DIAGNOSIS — G40209 Localization-related (focal) (partial) symptomatic epilepsy and epileptic syndromes with complex partial seizures, not intractable, without status epilepticus: Secondary | ICD-10-CM

## 2021-10-12 DIAGNOSIS — I10 Essential (primary) hypertension: Secondary | ICD-10-CM

## 2021-10-12 MED ORDER — METFORMIN HCL 500 MG PO TABS: 250.0000 mg | ORAL_TABLET | Freq: Two times a day (BID) | ORAL | 1 refills | Status: DC

## 2021-10-12 MED ORDER — ROSUVASTATIN CALCIUM 20 MG PO TABS: 20.0000 mg | ORAL_TABLET | Freq: Every day | ORAL | 1 refills | Status: DC

## 2021-10-12 MED ORDER — OMEPRAZOLE 40 MG PO CPDR: 40.0000 mg | DELAYED_RELEASE_CAPSULE | Freq: Two times a day (BID) | ORAL | 1 refills | Status: DC

## 2021-10-12 NOTE — Progress Notes (Signed)
Chronic Care Management Pharmacy Note  10/12/2021 Name:  Sabrina Guerrero MRN:  [id number] DOB:  Jan 21, 1946  Summary: Patient presents for initial CCM consult.   -Prescribed rosuvastatin, patient elected to discontinue due to pill burden and lack of understanding regarding indication of medication.    Recommendations/Changes made from today's visit: -RESTART Rosuvastatin  Plan: CPP follow-up 4 months   Subjective: Sabrina Guerrero is an 76 y.o. year old female who is a primary patient of Nche, Bonna Gains, NP.  The CCM team was consulted for assistance with disease management and care coordination needs.    Engaged with patient by telephone for initial visit in response to provider referral for pharmacy case management and/or care coordination services.   Consent to Services:  The patient was given the following information about Chronic Care Management services today, agreed to services, and gave verbal consent: 1. CCM service includes personalized support from designated clinical staff supervised by the primary care provider, including individualized plan of care and coordination with other care providers 2. 24/7 contact phone numbers for assistance for urgent and routine care needs. 3. Service will only be billed when office clinical staff spend 20 minutes or more in a month to coordinate care. 4. Only one practitioner may furnish and bill the service in a calendar month. 5.The patient may stop CCM services at any time (effective at the end of the month) by phone call to the office staff. 6. The patient will be responsible for cost sharing (co-pay) of up to 20% of the service fee (after annual deductible is met). Patient agreed to services and consent obtained.  Patient Care Team: Nche, Bonna Gains, NP as PCP - General (Internal Medicine) Gaspar Cola, [facility] (Pharmacist)  Recent office visits: 09/04/2021 Alysia Penna NP (PCP) No medication changes noted, AMB Referral to  [facility] Coordination, Return in about 3 months  07/11/2021 March Rummage LPN (PCP Office) Medicare Wellness completed, No medication Changes noted 04/20/2021 Alysia Penna NP (PCP) start Metformin 250 mg 2 times daily, Start Rosuvastatin 20 mg 3 times weekly, Ambulatory referral to Ophthalmology, Return in about 3 months   Recent consult visits: 08/29/2021 Dr. Teresa Coombs MD (Neurology) No medication Changes noted, follow-up in 6 months  04/25/2021 Osvaldo Human RD (Dietician) No Medication Changes noted   Hospital visits: Admitted to the hospital on 09/24/2021 due to Dizziness. Discharge date was 09/24/2021. Discharged from [facility]   Objective:  Lab Results  Component Value Date   CREATININE 0.59 09/24/2021   BUN 7 (L) 09/24/2021   GFR 83.36 04/20/2021   EGFR 91 08/29/2021   GFRNONAA >60 09/24/2021   GFRAA 81 04/01/2019   NA 128 (L) 09/24/2021   K 3.8 09/24/2021   CALCIUM 9.0 09/24/2021   CO2 25 09/24/2021   GLUCOSE 145 (H) 09/24/2021    Lab Results  Component Value Date/Time   HGBA1C 6.5 (A) 09/04/2021 01:41 PM   HGBA1C 6.8 (H) 04/20/2021 11:09 AM   HGBA1C 6.5 11/18/2012 10:53 AM   GFR 83.36 04/20/2021 11:09 AM   GFR 93.45 11/18/2012 10:53 AM   MICROALBUR 1.0 09/04/2021 01:53 PM    Last diabetic Eye exam: No results found for: "HMDIABEYEEXA"  Last diabetic Foot exam:  Lab Results  Component Value Date/Time   HMDIABFOOTEX NORMAL 08/16/2021 12:00 AM     Lab Results  Component Value Date   CHOL 213 (H) 04/20/2021   HDL 45.80 04/20/2021   LDLCALC 148 (H) 04/20/2021   LDLDIRECT 178.0 11/18/2012  TRIG 98.0 04/20/2021   CHOLHDL 5 04/20/2021       Latest Ref Rng & Units 09/24/2021    7:07 PM 02/28/2021   10:49 AM 10/27/2020    9:27 PM  Hepatic Function  Total Protein 6.5 - 8.1 g/dL 7.7  7.5  8.3   Albumin 3.5 - 5.0 g/dL 3.9  4.1  4.0   AST 15 - 41 U/L $Remo'20  11  16   'TpkPN$ ALT 0 - 44 U/L $Remo'10  6  11   'Exkcd$ Alk Phosphatase 38 - 126 U/L 66  103  84   Total  Bilirubin 0.3 - 1.2 mg/dL 0.7  <0.2  0.6   Bilirubin, Direct 0.0 - 0.2 mg/dL   <0.1     Lab Results  Component Value Date/Time   TSH 1.080 10/06/2020 09:51 AM   TSH 0.65 11/18/2012 10:53 AM       Latest Ref Rng & Units 09/24/2021    7:07 PM 02/28/2021   10:49 AM 10/27/2020    6:45 PM  CBC  WBC 4.0 - 10.5 K/uL 4.9  3.9  5.2   Hemoglobin 12.0 - 15.0 g/dL 11.9  12.6  13.6   Hematocrit 36.0 - 46.0 % 35.3  37.6  37.6   Platelets 150 - 400 K/uL 224  240  211     No results found for: "VD25OH"  Clinical ASCVD: No  The 10-year ASCVD risk score (Arnett DK, et al., 2019) is: 41.9%   Values used to calculate the score:     Age: 84 years     Sex: Female     Is Non-Hispanic African American: Yes     Diabetic: Yes     Tobacco smoker: No     Systolic Blood Pressure: 161 mmHg     Is BP treated: No     HDL Cholesterol: 45.8 mg/dL     Total Cholesterol: 213 mg/dL       07/11/2021   10:56 AM 07/11/2021   10:52 AM 04/25/2021   10:31 AM  Depression screen PHQ 2/9  Decreased Interest 0 0 0  Down, Depressed, Hopeless 0 0 0  PHQ - 2 Score 0 0 0    Social History   Tobacco Use  Smoking Status Never  Smokeless Tobacco Never   BP Readings from Last 3 Encounters:  09/24/21 (!) 149/83  09/04/21 138/80  08/29/21 116/70   Pulse Readings from Last 3 Encounters:  09/24/21 77  09/04/21 65  08/29/21 67   Wt Readings from Last 3 Encounters:  09/24/21 180 lb (81.6 kg)  09/04/21 150 lb 9.6 oz (68.3 kg)  08/29/21 152 lb (68.9 kg)   BMI Readings from Last 3 Encounters:  09/24/21 32.92 kg/m  09/04/21 29.41 kg/m  08/29/21 29.69 kg/m    Assessment/Interventions: Review of patient past medical history, allergies, medications, health status, including review of consultants reports, laboratory and other test data, was performed as part of comprehensive evaluation and provision of chronic care management services.   SDOH:  (Social Determinants of Health) assessments and interventions  performed: Yes SDOH Interventions    Flowsheet Row Chronic Care Management from 10/12/2021 in [place] from 07/11/2021 in [place] Interventions -- Intervention Not Indicated  Housing Interventions -- Intervention Not Indicated  Transportation Interventions Intervention Not Indicated Intervention Not Indicated  Financial Strain Interventions Intervention Not Indicated Intervention Not Indicated  Physical Activity Interventions -- Intervention Not Indicated  Stress Interventions -- Intervention Not Indicated  Social Connections Interventions -- Intervention Not Indicated      SDOH Screenings   Food Insecurity: No Food Insecurity (07/11/2021)  Housing: Low Risk  (07/11/2021)  Transportation Needs: No Transportation Needs (10/12/2021)  Alcohol Screen: Low Risk  (07/11/2021)  Depression (PHQ2-9): Low Risk  (07/11/2021)  Financial Resource Strain: Low Risk  (10/12/2021)  Physical Activity: Sufficiently Active (07/11/2021)  Social Connections: Moderately Isolated (07/11/2021)  Stress: No Stress Concern Present (07/11/2021)  Tobacco Use: Low Risk  (09/04/2021)    CCM Care Plan  No Known Allergies  Medications Reviewed Today     Reviewed by Flossie Buffy, NP (Nurse Practitioner) on 09/04/21 at 48  Med List Status: <None>   Medication Order Taking? Sig Documenting Provider Last Dose Status Informant  blood glucose meter kit and supplies KIT [id number] Yes Dispense based on patient and insurance preference. Check glucose daily, E11.65 Nche, Charlene Brooke, NP Taking Active   glucose blood (ACCU-CHEK GUIDE) test strip [id number] Yes TEST DAILY Nche, Charlene Brooke, NP Taking Active   Lacosamide 150 MG TABS [id number] Yes Take 1 tablet (150 mg total) by mouth 2 (two) times daily. Suzzanne Cloud, NP Taking Active   metFORMIN (GLUCOPHAGE) 500 MG tablet [id number] Yes Take 0.5 tablets (250 mg total) by  mouth 2 (two) times daily with a meal. Nche, Charlene Brooke, NP Taking Active   omeprazole (PRILOSEC) 40 MG capsule [id number] Yes Take 40 mg by mouth 2 (two) times daily. [provider] Taking Active   OXcarbazepine (TRILEPTAL) 150 MG tablet [id number] Yes Take 1 tablet (150 mg total) by mouth 2 (two) times daily. Suzzanne Cloud, NP Taking Active   rosuvastatin (CRESTOR) 20 MG tablet [id number] Yes Take 1 tablet (20 mg total) by mouth 3 (three) times a week. Flossie Buffy, NP Taking Active   thiamine 100 MG tablet [id number] Yes Take 1 tablet by mouth daily. [provider] Taking Active             Patient Active Problem List   Diagnosis Date Noted   Abdominal pain 11/11/2020   Personal history of other diseases of the circulatory system 11/11/2020   Seizure disorder ([place]) 11/11/2020   Obesity 91/50/5697   Helicobacter pylori gastrointestinal tract infection 11/11/2020   Diaphragmatic hernia 11/11/2020   Hyperlipidemia associated with type 2 diabetes mellitus ([place]) 11/11/2020   Essential hypertension 11/11/2020   History of subdural hematoma (post traumatic) 03/23/2020   Bilateral subdural hematomas ([place]) 02/01/2020   Partial symptomatic epilepsy with complex partial seizures, not intractable, without status epilepticus ([place]) 05/20/2014   Memory deficit 05/20/2014   Altered mental status 03/11/2013   Trapezius muscle spasm 12/03/2012   Generalized anxiety disorder 12/03/2012   Hx SBO 11/18/2012   GERD (gastroesophageal reflux disease) 11/18/2012   Type 2 diabetes mellitus with hyperglycemia, without long-term current use of insulin ([place]) 11/18/2012   Left thyroid nodule 11/18/2012   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   Hypoalbuminemia 02/13/2011    Immunization History  Administered Date(s) Administered   PFIZER(Purple Top)SARS-COV-2 Vaccination 09/01/2019, 09/22/2019    Conditions to be addressed/monitored:  Hypertension, Hyperlipidemia, Diabetes,  and History of Seizures  Care Plan : General Pharmacy (Adult)  Updates made by Germaine Pomfret, [place] since 10/12/2021 12:00 AM     Problem: Hypertension, Hyperlipidemia, Diabetes, and History of Seizures   Priority: High     Long-Range Goal: Patient-Specific Goal   Start Date: 10/12/2021  Expected End Date: 10/13/2022  This Visit's  Progress: On track  Priority: High  Note:   Current Barriers:  Unable to self administer medications as prescribed  Pharmacist Clinical Goal(s):  Patient will achieve adherence to monitoring guidelines and medication adherence to achieve therapeutic efficacy through collaboration with PharmD and provider.   Interventions: 1:1 collaboration with Nche, Charlene Brooke, NP regarding development and update of comprehensive plan of care as evidenced by provider attestation and co-signature Inter-disciplinary care team collaboration (see longitudinal plan of care) Comprehensive medication review performed; medication list updated in electronic medical record  Hypertension (BP goal <130/80) -Controlled -Current treatment: None -Medications previously tried: NA  -Recommended to continue current medication  Hyperlipidemia: (LDL goal < 70) -Uncontrolled -Current treatment: None -Medications previously tried: NA -Prescribed rosuvastatin, patient elected to discontinue due to pill burden and lack of understanding regarding indication of medication. Counseled patient's daughter on importance of controlling cholesterol to minimize risk of heart disease, especially given patients very high 10-year risk score.   -Patient is a candidate for high-intensity statin.  -RESTART Rosuvastatin  Diabetes (A1c goal <7%) -Controlled -Current medications: Metformin 250 mg 1/2 tablet twice daily  -Medications previously tried: NA  -Current home glucose readings Random: 103  -Denies hypoglycemic/hyperglycemic symptoms -Recommended to continue current medication  History  of Seizures (Goal: Prevent seizures) -Controlled -Managed by Dr. April Manson  -Current treatment  Lacosamide 150 mg twice daily  Oxcarbazepine 200 mg  -Medications previously tried: NA  -Recommended to continue current medication  GERD (Goal: Minimize symptoms) -Controlled -History of diaphragmatic hernia  -Current treatment  Omeprazole 40 mg twice daily  -Medications previously tried: NA  -Recommended to continue current medication  Patient Goals/Self-Care Activities Patient will:  - check glucose 2-3 times weekly before breakfast, document, and provide at future appointments  Follow Up Plan: Telephone follow up appointment with care management team member scheduled for:  02/08/21 at 1:00 PM      Medication Assistance: None required.  Patient affirms current coverage meets needs.  Compliance/Adherence/Medication fill history: Care Gaps: COVID-19 Vaccine Influenza Vaccine  Star-Rating Drugs: Metformin 500 mg last filled on 07/20/2021 [facility]  Rosuvastatin 20 mg last filled on 10/07/2021 [facility].   Patient's preferred pharmacy is:  [facility] - [place], [place] - [address] [place] [address] Phone: [phone] Fax: [phone]  Uses pill box? Yes Pt endorses 100% compliance  We discussed: Verbal consent obtained for [facility] enhanced pharmacy services (medication synchronization, adherence packaging, delivery coordination). A medication sync plan was created to allow patient to get all medications delivered once every 30 to 90 days per patient preference. Patient understands they have freedom to choose pharmacy and clinical pharmacist will coordinate care between all prescribers and [facility]. Patient decided to: Utilize [facility] for medication synchronization, packaging and delivery  Care Plan and Follow Up Patient Decision:   Patient agrees to Care Plan and Follow-up.  Plan: Telephone follow up appointment with care management team member scheduled for:  02/08/21 at 1:00 PM  Junius Argyle, PharmD, Para [place], CPP Clinical Pharmacist Practitioner  [facility] at [facility]  [phone]

## 2021-10-12 NOTE — Patient Instructions (Signed)
Visit Information It was great speaking with you today!  Please let me know if you have any questions about our visit.  Patient Care Plan: General Pharmacy (Adult)     Problem Identified: Hypertension, Hyperlipidemia, Diabetes, and History of Seizures   Priority: High     Long-Range Goal: Patient-Specific Goal   Start Date: 10/12/2021  Expected End Date: 10/13/2022  This Visit's Progress: On track  Priority: High  Note:   Current Barriers:  Unable to self administer medications as prescribed  Pharmacist Clinical Goal(s):  Patient will achieve adherence to monitoring guidelines and medication adherence to achieve therapeutic efficacy through collaboration with PharmD and provider.   Interventions: 1:1 collaboration with Nche, Charlene Brooke, NP regarding development and update of comprehensive plan of care as evidenced by provider attestation and co-signature Inter-disciplinary care team collaboration (see longitudinal plan of care) Comprehensive medication review performed; medication list updated in electronic medical record  Hypertension (BP goal <130/80) -Controlled -Current treatment: None -Medications previously tried: NA  -Recommended to continue current medication  Hyperlipidemia: (LDL goal < 70) -Uncontrolled -Current treatment: None -Medications previously tried: NA -Prescribed rosuvastatin, patient elected to discontinue due to pill burden and lack of understanding regarding indication of medication. Counseled patient's daughter on importance of controlling cholesterol to minimize risk of heart disease, especially given patients very high 10-year risk score.   -Patient is a candidate for high-intensity statin.  -RESTART Rosuvastatin  Diabetes (A1c goal <7%) -Controlled -Current medications: Metformin 250 mg 1/2 tablet twice daily  -Medications previously tried: NA  -Current home glucose readings Random: 103  -Denies hypoglycemic/hyperglycemic  symptoms -Recommended to continue current medication  History of Seizures (Goal: Prevent seizures) -Controlled -Managed by Dr. April Manson  -Current treatment  Lacosamide 150 mg twice daily  Oxcarbazepine 200 mg  -Medications previously tried: NA  -Recommended to continue current medication  GERD (Goal: Minimize symptoms) -Controlled -History of diaphragmatic hernia  -Current treatment  Omeprazole 40 mg twice daily  -Medications previously tried: NA  -Recommended to continue current medication  Patient Goals/Self-Care Activities Patient will:  - check glucose 2-3 times weekly before breakfast, document, and provide at future appointments  Follow Up Plan: Telephone follow up appointment with care management team member scheduled for:  02/08/21 at 1:00 PM    Ms. Hirt was given information about Chronic Care Management services today including:  CCM service includes personalized support from designated clinical staff supervised by her physician, including individualized plan of care and coordination with other care providers 24/7 contact phone numbers for assistance for urgent and routine care needs. Standard insurance, coinsurance, copays and deductibles apply for chronic care management only during months in which we provide at least 20 minutes of these services. Most insurances cover these services at 100%, however patients may be responsible for any copay, coinsurance and/or deductible if applicable. This service may help you avoid the need for more expensive face-to-face services. Only one practitioner may furnish and bill the service in a calendar month. The patient may stop CCM services at any time (effective at the end of the month) by phone call to the office staff.  Patient agreed to services and verbal consent obtained.   Patient verbalizes understanding of instructions and care plan provided today and agrees to view in [place]. Active MyChart status and patient understanding  of how to access instructions and care plan via MyChart confirmed with patient.     Junius Argyle, PharmD, BCACP, CPP Clinical Pharmacist Practitioner  [facility] at [facility]  [phone]  

## 2021-10-13 ENCOUNTER — Telehealth: Payer: Self-pay

## 2021-10-13 NOTE — Progress Notes (Signed)
I received a task from Junius Argyle, CPP requesting that I complete on boarding form for patient to Utilize [facility] for medication synchronization, packaging and delivery     [facility] on [facility]16/2021 to go to [facility], they will send the profile transfer by the end of the day.  I reach out to patient neurology to request refills for Lacosamide and [facility]carbazepine [facility]21.  [place] Pharmacist Assistant [phone]

## 2021-10-16 ENCOUNTER — Telehealth: Payer: Self-pay | Admitting: Neurology

## 2021-10-16 MED ORDER — OXCARBAZEPINE 150 MG PO TABS: 150.0000 mg | ORAL_TABLET | Freq: Two times a day (BID) | ORAL | 1 refills | Status: DC

## 2021-10-16 NOTE — Telephone Encounter (Signed)
Sabrina Guerrero/ [facility]. Sabrina Guerrero is requesting refills on OXcarbazepine (TRILEPTAL) 150 MG tablet and Lacosamide 150 MG TABS. Sabrina Guerrero is requesting refills be sent to [facility] .

## 2021-10-16 NOTE — Addendum Note (Signed)
Addended by: Cristela Felt E on: 10/16/2021 11:36 AM   Modules accepted: Orders

## 2021-10-16 NOTE — Telephone Encounter (Signed)
Refills were sent for Lacosamide 150 Mg Tablet [facility] [address] For a quantity of 180 tablets for 90 days. It included one refill. The refill is not appropriate.  A prescription for oxcarbazepine 150 mg tablets will be sent to [facility]

## 2021-10-27 LAB — HM DIABETES EYE EXAM

## 2021-11-04 DIAGNOSIS — Z7984 Long term (current) use of oral hypoglycemic drugs: Secondary | ICD-10-CM | POA: Diagnosis not present

## 2021-11-04 DIAGNOSIS — I10 Essential (primary) hypertension: Secondary | ICD-10-CM

## 2021-11-04 DIAGNOSIS — E1169 Type 2 diabetes mellitus with other specified complication: Secondary | ICD-10-CM | POA: Diagnosis not present

## 2021-11-04 DIAGNOSIS — E785 Hyperlipidemia, unspecified: Secondary | ICD-10-CM

## 2021-12-13 ENCOUNTER — Telehealth: Payer: Self-pay

## 2021-12-13 NOTE — Progress Notes (Signed)
    Chronic Care Management Pharmacy Assistant   Name: Sabrina Guerrero  MRN: [id number] DOB: [birth year]  Reason for Encounter: Medication Review/Medication Coordination Call.   Recent office visits:  None ID  Recent consult visits:  11/20/2021 Nigel Sloop [facility] and [employer] [facility]) No Medication Changes noted    Hospital visits:  None in previous 6 months  Medications: Outpatient Encounter Medications as of 12/13/2021  Medication Sig   blood glucose meter kit and supplies KIT Dispense based on patient and insurance preference. Check glucose daily, E11.65   glucose blood (ACCU-CHEK GUIDE) test strip TEST DAILY   Lacosamide 150 MG TABS Take 1 tablet (150 mg total) by mouth 2 (two) times daily.   metFORMIN (GLUCOPHAGE) 500 MG tablet Take 0.5 tablets (250 mg total) by mouth 2 (two) times daily with a meal.   omeprazole (PRILOSEC) 40 MG capsule Take 1 capsule (40 mg total) by mouth 2 (two) times daily.   OXcarbazepine (TRILEPTAL) 150 MG tablet Take 1 tablet (150 mg total) by mouth 2 (two) times daily.   rosuvastatin (CRESTOR) 20 MG tablet Take 1 tablet (20 mg total) by mouth daily.   thiamine 100 MG tablet Take 1 tablet by mouth every Monday, Wednesday, and Friday.   No facility-administered encounter medications on file as of 12/13/2021.    Care Gaps: COVID-19 Vaccine Influenza Vaccine Shingrix Vaccine   Star Rating Drugs: Metformin 500 mg last filled on 10/17/2021 for 68 day supply at [facility].  Rosuvastatin 20 mg last filled on 11/13/2021 for 40 day supply at [facility].   Reviewed chart for medication changes ahead of medication coordination call.  BP Readings from Last 3 Encounters:  09/24/21 (!) 149/83  09/04/21 138/80  08/29/21 116/70    Lab Results  Component Value Date   HGBA1C 6.5 (A) 09/04/2021     Patient obtains medications through Adherence Packaging  30 Days   Last adherence delivery included:  None  ID  Patient declined medication last month: None ID  Patient is due for next adherence delivery on: 12/25/2021. Called patient and reviewed medications and coordinated delivery.  Unable to reach patient to completed Medication Coordination form. Form was completed based on last month delivery. [facility] will contact patient to confirm delivery.Junius Argyle, CPP was notified I was unable to reach patient  This delivery to include: Lacosamide 150 mg - 1 tablet 2 times daily - Before Breakfast, Evening meals Metformin 500 mg -  0.5 tablets 2 times daily - Breakfast, Bedtime Omeprazole 40 mg - 1 capsule 2 times daily - Breakfast, Bedtime Oxacrbazepine 150 mg- 1 tablet 2 times daily - Before Breakfast, Evening meals Rosuvastatin 20 mg-  1 tablet daily - Breakfast Vitamin B1 100 mg- 1 tablet daily - Bedtime   Patient declined the following medications: None ID  Patient needs refills for None ID.  Unable to Confirmed delivery date of 12/25/2021 ([facility]).  [place] Pharmacist Assistant [phone]

## 2021-12-19 ENCOUNTER — Other Ambulatory Visit: Payer: Self-pay | Admitting: Neurology

## 2021-12-19 ENCOUNTER — Telehealth: Payer: Self-pay

## 2021-12-19 MED ORDER — LACOSAMIDE 150 MG PO TABS
150.0000 mg | ORAL_TABLET | Freq: Two times a day (BID) | ORAL | 1 refills | Status: DC
Start: 2021-12-19 — End: 2022-03-01

## 2021-12-19 NOTE — Telephone Encounter (Signed)
Last fill 09/25/2021 # 180 for a 90 day. Rx needs to be [facility].

## 2021-12-19 NOTE — Progress Notes (Signed)
I reach out to patient neurology to request refills for Lacosamide to go [facility] on 12/19/2021.  Per Neurology, they will send the message informing them [facility] needs a new rx as the patient switch pharmacy.  [place] Pharmacist Assistant [phone]

## 2021-12-19 NOTE — Telephone Encounter (Signed)
[facility] called stating that they are needing a new Rx for the pt's Lacosamide 150 MG TABS due to the pt making a pharmacy switch. Pt is requesting a refill and is needing it to come from [place] from now on.

## 2021-12-29 ENCOUNTER — Other Ambulatory Visit: Payer: Self-pay | Admitting: Neurology

## 2022-01-11 ENCOUNTER — Telehealth: Payer: Self-pay

## 2022-01-11 NOTE — Progress Notes (Signed)
    Chronic Care Management Pharmacy Assistant   Name: Sabrina Guerrero  MRN: [id number] DOB: [birth year]  Reason for Encounter: Medication Review/Medication Coordination Call.  Recent office visits:  None ID  Recent consult visits:  None ID  Hospital visits:  None in previous 6 months  Medications: Outpatient Encounter Medications as of 01/11/2022  Medication Sig   blood glucose meter kit and supplies KIT Dispense based on patient and insurance preference. Check glucose daily, E11.65   glucose blood (ACCU-CHEK GUIDE) test strip TEST DAILY   Lacosamide 150 MG TABS Take 1 tablet (150 mg total) by mouth 2 (two) times daily.   metFORMIN (GLUCOPHAGE) 500 MG tablet Take 0.5 tablets (250 mg total) by mouth 2 (two) times daily with a meal.   omeprazole (PRILOSEC) 40 MG capsule Take 1 capsule (40 mg total) by mouth 2 (two) times daily.   OXcarbazepine (TRILEPTAL) 150 MG tablet TAKE 1 TABLET BY MOUTH TWICE A DAY   rosuvastatin (CRESTOR) 20 MG tablet Take 1 tablet (20 mg total) by mouth daily.   thiamine 100 MG tablet Take 1 tablet by mouth every Monday, Wednesday, and Friday.   No facility-administered encounter medications on file as of 01/11/2022.    Care Gaps: COVID-19 Vaccine Influenza Vaccine Shingrix Vaccine Dtap Vaccine   Star Rating Drugs: Metformin 500 mg last filled on 12/20/2021 for 30 day supply at [place].  Rosuvastatin 20 mg last filled on 12/20/2021 for 30 day supply at [facility].   Reviewed chart for medication changes ahead of medication coordination call.  BP Readings from Last 3 Encounters:  09/24/21 (!) 149/83  09/04/21 138/80  08/29/21 116/70    Lab Results  Component Value Date   HGBA1C 6.5 (A) 09/04/2021     Patient obtains medications through Adherence Packaging  30 Days   Unable to reach patient to completed Medication Coordination form. Form was completed based on last month delivery.   Last adherence delivery  included: Lacosamide 150 mg - 1 tablet 2 times daily - Before Breakfast, Evening meals Metformin 500 mg -  0.5 tablets 2 times daily - Breakfast, Bedtime Omeprazole 40 mg - 1 capsule 2 times daily - Breakfast, Bedtime Oxacrbazepine 150 mg- 1 tablet 2 times daily - Before Breakfast, Evening meals Rosuvastatin 20 mg-  1 tablet daily - Breakfast Vitamin B1 100 mg- 1 tablet daily - Bedtime  Patient declined medications last month: None ID   Patient is due for next adherence delivery on: 01/22/2022. Called patient and reviewed medications and coordinated delivery.  This delivery to include: Lacosamide 150 mg - 1 tablet 2 times daily - Before Breakfast, Evening meals Metformin 500 mg -  0.5 tablets 2 times daily - Breakfast, Bedtime Omeprazole 40 mg - 1 capsule 2 times daily - Breakfast, Bedtime Oxacrbazepine 150 mg- 1 tablet 2 times daily - Before Breakfast, Evening meals Rosuvastatin 20 mg-  1 tablet daily - Breakfast Vitamin B1 100 mg- 1 tablet daily - Bedtime  Patient declined the following medications: None ID   Patient needs refills for None ID.  Confirmed delivery date of 01/22/2022 ([facility]), advised patient that pharmacy will contact them the morning of delivery.  [place] Pharmacist Assistant [phone]

## 2022-01-24 LAB — HM DIABETES EYE EXAM

## 2022-02-02 ENCOUNTER — Encounter: Payer: Self-pay | Admitting: Nurse Practitioner

## 2022-02-08 ENCOUNTER — Telehealth: Payer: Medicare Other

## 2022-02-09 ENCOUNTER — Telehealth: Payer: Self-pay

## 2022-02-09 NOTE — Progress Notes (Cosign Needed Addendum)
Care Management & Coordination Services Pharmacy Team  Reason for Encounter: Medication coordination and delivery  Contacted patient on 02/09/2022 to discuss medications   Recent office visits:  None ID  Recent consult visits:  None ID  Hospital visits:  None in previous 6 months  Medications: Outpatient Encounter Medications as of 02/09/2022  Medication Sig   blood glucose meter kit and supplies KIT Dispense based on patient and insurance preference. Check glucose daily, E11.65   glucose blood (ACCU-CHEK GUIDE) test strip TEST DAILY   Lacosamide 150 MG TABS Take 1 tablet (150 mg total) by mouth 2 (two) times daily.   metFORMIN (GLUCOPHAGE) 500 MG tablet Take 0.5 tablets (250 mg total) by mouth 2 (two) times daily with a meal.   omeprazole (PRILOSEC) 40 MG capsule Take 1 capsule (40 mg total) by mouth 2 (two) times daily.   OXcarbazepine (TRILEPTAL) 150 MG tablet TAKE 1 TABLET BY MOUTH TWICE A DAY   rosuvastatin (CRESTOR) 20 MG tablet Take 1 tablet (20 mg total) by mouth daily.   thiamine 100 MG tablet Take 1 tablet by mouth every Monday, Wednesday, and Friday.   No facility-administered encounter medications on file as of 02/09/2022.   BP Readings from Last 3 Encounters:  09/24/21 (!) 149/83  09/04/21 138/80  08/29/21 116/70    Pulse Readings from Last 3 Encounters:  09/24/21 77  09/04/21 65  08/29/21 67    Lab Results  Component Value Date/Time   HGBA1C 6.5 (A) 09/04/2021 01:41 PM   HGBA1C 6.8 (H) 04/20/2021 11:09 AM   HGBA1C 6.5 11/18/2012 10:53 AM   Lab Results  Component Value Date   CREATININE 0.59 09/24/2021   BUN 7 (L) 09/24/2021   GFR 83.36 04/20/2021   GFRNONAA >60 09/24/2021   GFRAA 81 04/01/2019   NA 128 (L) 09/24/2021   K 3.8 09/24/2021   CALCIUM 9.0 09/24/2021   CO2 25 09/24/2021   Care Gaps: COVID-19 Vaccine Influenza Vaccine Shingrix Vaccine Dtap Vaccine   Star Rating Drugs: Metformin 500 mg last filled on [facility]/16/2022 [facility]  Rosuvastatin 20 mg last filled on 01/16/2022 [facility]   Last adherence delivery date:01/22/2022      Patient is due for next adherence delivery on: 02/21/2022  Spoke with patient on 02/09/2022 reviewed medications and coordinated delivery.  This delivery to include: Vials  30 Days  Omeprazole 40 mg - 1 capsule 2 times daily - Breakfast, Bedtime  Lacosamide 150 mg - 1 tablet 2 times daily - Before Breakfast, Evening meals   Patient declined the following medications this month: Metformin 500 mg -  0.5 tablets 2 times daily - Breakfast, Bedtime -Adequate supply, last filled [facility]/16/2022 [facility].Patient daughter states she [facility]. Oxacrbazepine 150 mg- 1 tablet 2 times daily - Before Breakfast, Evening meals-Adequate supply, last filled 01/16/2022 [facility].Patient daughter states she prefers to [facility]. Rosuvastatin 20 mg-  1 tablet daily - Breakfast- Adequate supply, last filled 01/16/2022 [facility].Patient daughter states she prefers to [facility]. Vitamin B1 100 mg- 1 tablet daily - Bedtime- Adequate supply  No refill request needed.  Confirmed delivery date of 02/21/2022 ([facility]), advised patient that pharmacy will contact them the morning of delivery.   Any concerns about your medications? No  How often do you forget or accidentally miss a dose?  Never  Cycle dispensing form sent to [name] for review.  [place] Pharmacist Assistant [phone]

## 2022-02-17 IMAGING — US US ABDOMEN COMPLETE
1 series · 14 of 25 positions shown · non-contrast
Comparison: None.

CLINICAL DATA: Abdominal pain

EXAM:
ABDOMEN ULTRASOUND COMPLETE

[Series 1: us abdomen complete · 14 of 107 slices shown]
[im 1/107]
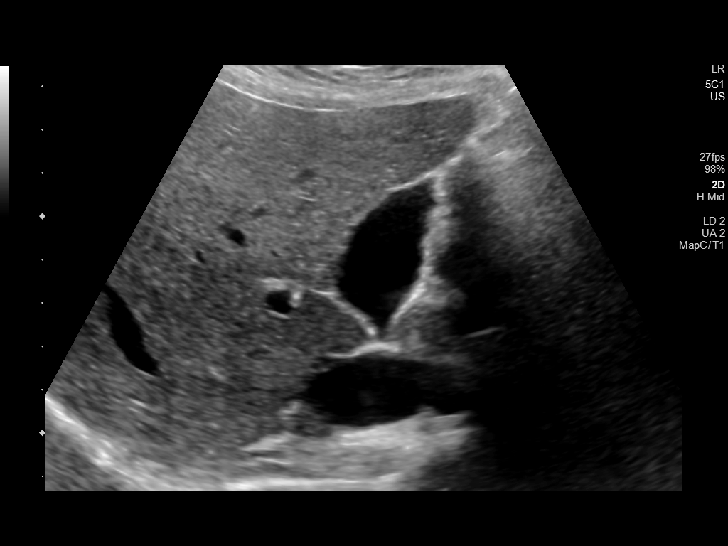
[im 9/107]
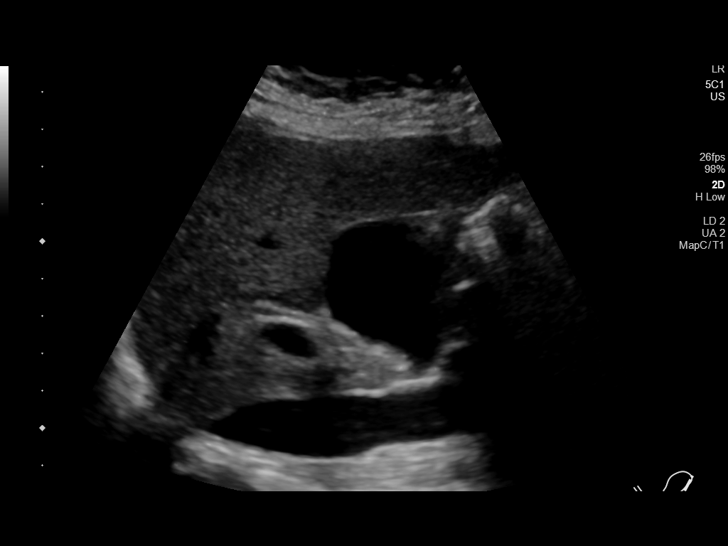
[im 18/107]
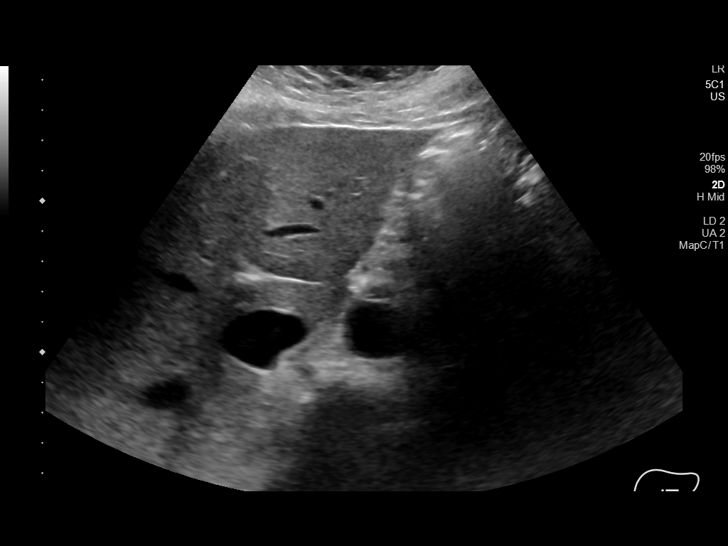
[im 27/107]
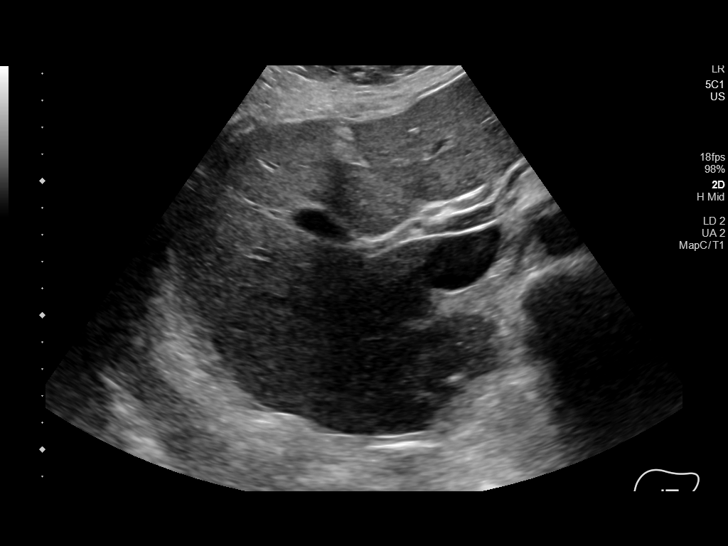
[im 36/107]
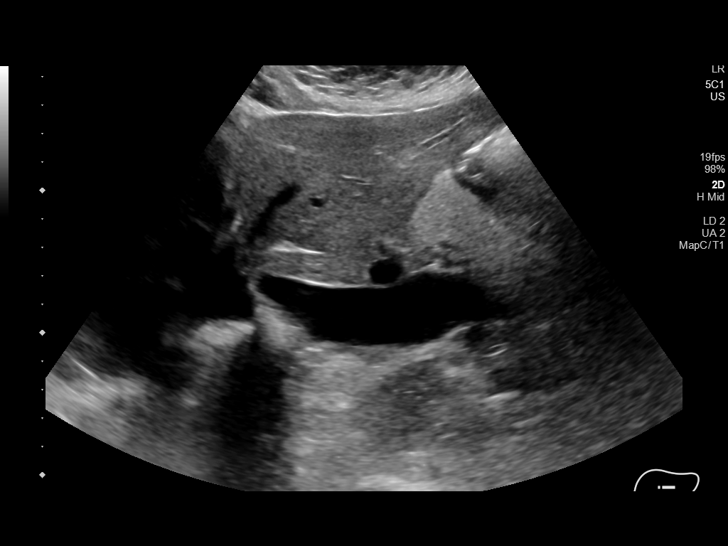
[im 40/107]
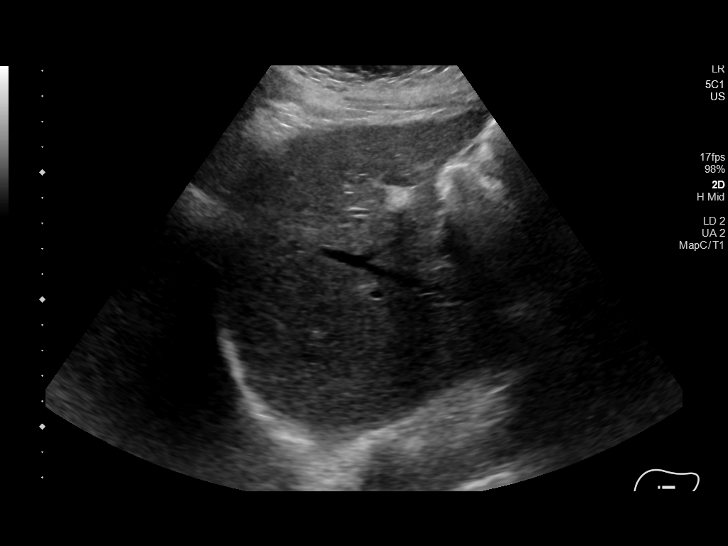
[im 49/107]
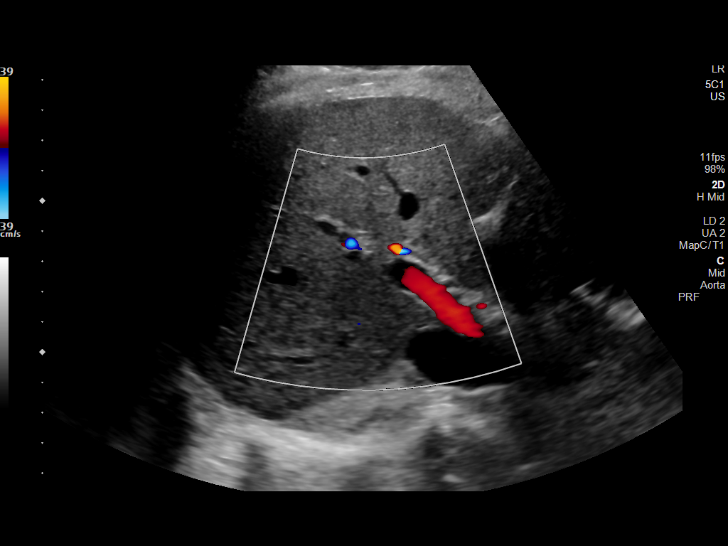
[im 58/107]
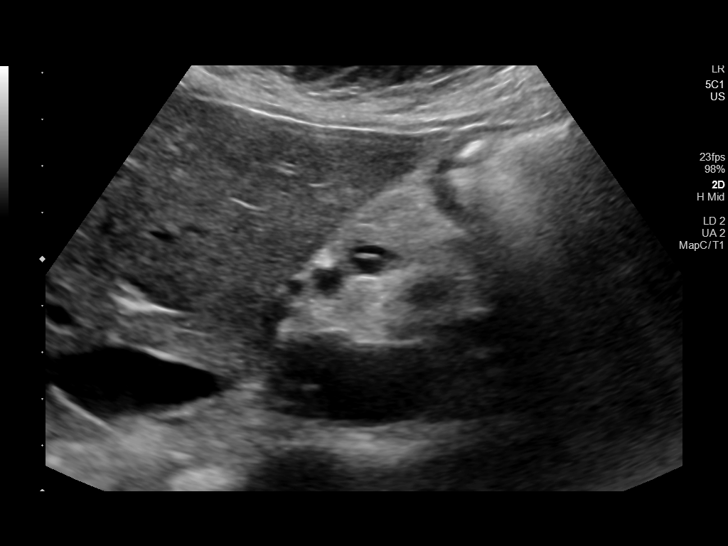
[im 67/107]
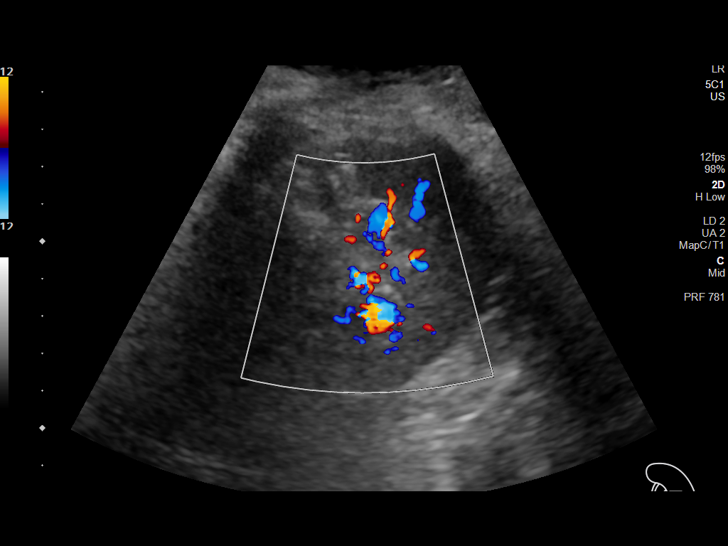
[im 71/107]
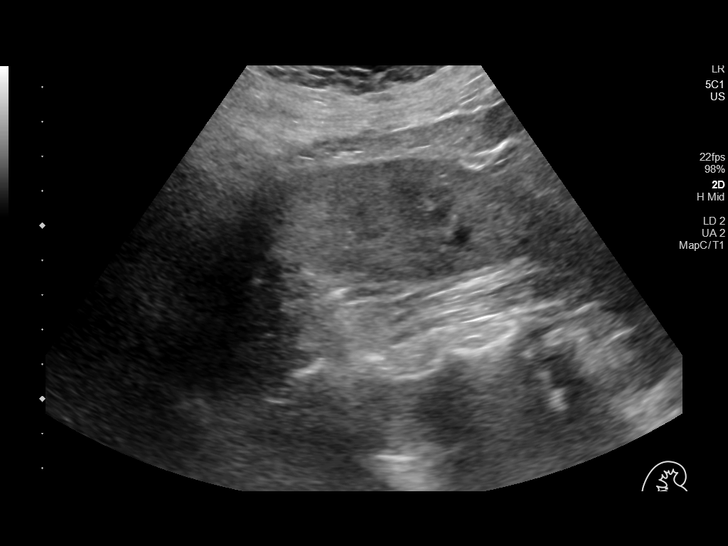
[im 80/107]
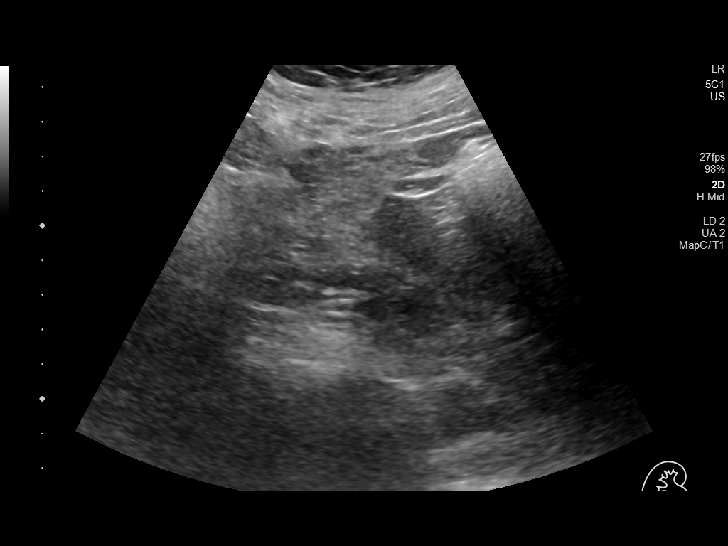
[im 89/107]
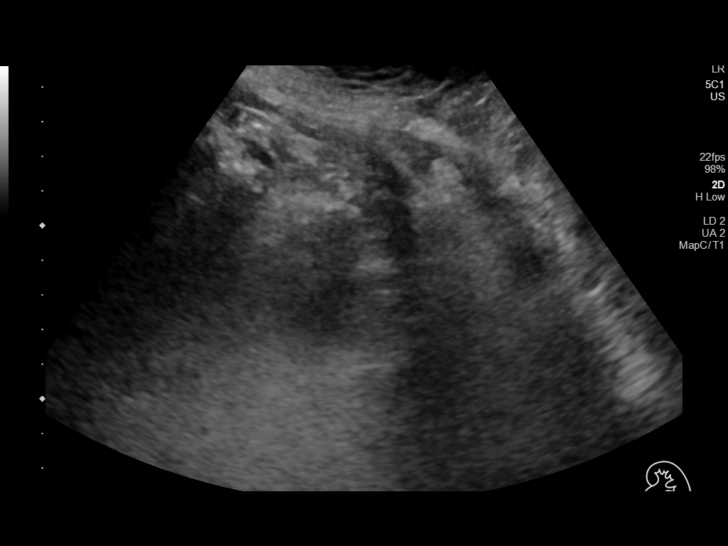
[im 98/107]
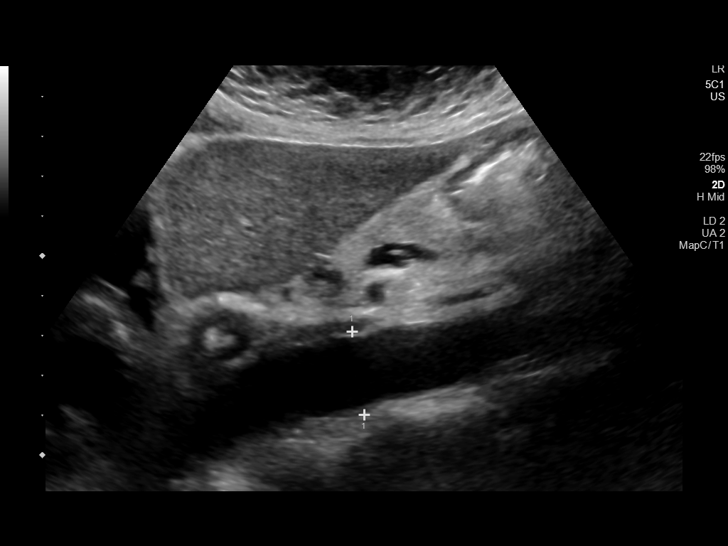
[im 107/107]
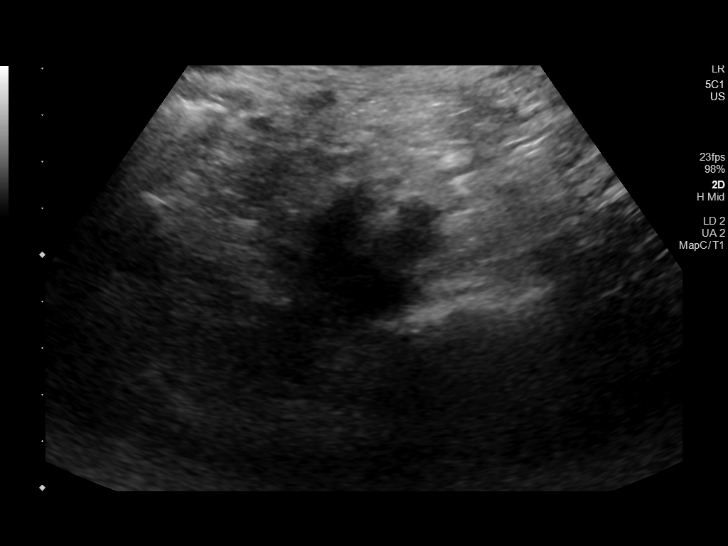

[14 of 25 positions shown; findings below may reference images not displayed]

FINDINGS: Gallbladder: No gallstones or wall thickening visualized. There is
no pericholecystic fluid. No sonographic Murphy sign noted by
sonographer.

Common bile duct: Diameter: 3 mm. No intrahepatic, common hepatic,
or common bile duct dilatation.

Liver: No focal lesion identified. Within normal limits in
parenchymal echogenicity. Portal vein is patent on color Doppler
imaging with normal direction of blood flow towards the liver.

IVC: No abnormality visualized.

Pancreas: No pancreatic mass or inflammatory focus.

Spleen: Size and appearance within normal limits.

Right Kidney: Length: 12.3 cm. Echogenicity within normal limits. No
mass or hydronephrosis visualized.

Left Kidney: Length: 11.2 cm. Echogenicity within normal limits. No
hydronephrosis visualized. There is a cyst in the mid left kidney
measuring 0.9 x 0.9 x 0.9 cm.

Abdominal aorta: No aneurysm visualized.

Other findings: No demonstrable ascites.
IMPRESSION: 9 mm cyst mid left kidney.  Study otherwise unremarkable.

## 2022-03-01 ENCOUNTER — Encounter: Payer: Self-pay | Admitting: Neurology

## 2022-03-01 ENCOUNTER — Telehealth: Payer: Self-pay

## 2022-03-01 ENCOUNTER — Ambulatory Visit (INDEPENDENT_AMBULATORY_CARE_PROVIDER_SITE_OTHER): Payer: 59 | Admitting: Neurology

## 2022-03-01 VITALS — BP 95/60 | HR 76 | Ht 62.0 in | Wt 180.0 lb

## 2022-03-01 DIAGNOSIS — G40209 Localization-related (focal) (partial) symptomatic epilepsy and epileptic syndromes with complex partial seizures, not intractable, without status epilepticus: Secondary | ICD-10-CM | POA: Diagnosis not present

## 2022-03-01 MED ORDER — OXCARBAZEPINE 150 MG PO TABS: 150.0000 mg | ORAL_TABLET | Freq: Two times a day (BID) | ORAL | 1 refills | Status: DC

## 2022-03-01 MED ORDER — LACOSAMIDE 150 MG PO TABS
150.0000 mg | ORAL_TABLET | Freq: Two times a day (BID) | ORAL | 1 refills | Status: DC
Start: 2022-03-01 — End: 2022-06-05

## 2022-03-01 NOTE — Progress Notes (Unsigned)
Care Management & Coordination Services Pharmacy Note  03/01/2022 Name:  Sabrina Guerrero MRN:  [id number] DOB:  [birth year]  Summary: ***  Recommendations/Changes made from today's visit: ***  Follow up plan: ***   Subjective: Sabrina Guerrero is an 77 y.o. year old female who is a primary patient of Nche, Charlene Brooke, NP.  The care coordination team was consulted for assistance with disease management and care coordination needs.    Engaged with patient by telephone for follow up visit.  Recent office visits: 09/04/2021 Wilfred Lacy NP (PCP) No medication changes noted, AMB Referral to [facility], Return in about 3 months    Recent consult visits: 03/01/21: Patient presented to Butler Denmark, NP (Neurology for follow-up.   Hospital visits: None in previous 6 months   Objective:  Lab Results  Component Value Date   CREATININE 0.59 09/24/2021   BUN 7 (L) 09/24/2021   GFR 83.36 04/20/2021   EGFR 91 08/29/2021   GFRNONAA >60 09/24/2021   GFRAA 81 04/01/2019   NA 128 (L) 09/24/2021   K 3.8 09/24/2021   CALCIUM 9.0 09/24/2021   CO2 25 09/24/2021   GLUCOSE 145 (H) 09/24/2021    Lab Results  Component Value Date/Time   HGBA1C 6.5 (A) 09/04/2021 01:41 PM   HGBA1C 6.8 (H) 04/20/2021 11:09 AM   HGBA1C 6.5 11/18/2012 10:53 AM   GFR 83.36 04/20/2021 11:09 AM   GFR 93.45 11/18/2012 10:53 AM   MICROALBUR 1.0 09/04/2021 01:53 PM    Last diabetic Eye exam:  Lab Results  Component Value Date/Time   HMDIABEYEEXA No Retinopathy 01/24/2022 01:57 PM    Last diabetic Foot exam:  Lab Results  Component Value Date/Time   HMDIABFOOTEX NORMAL 08/16/2021 12:00 AM     Lab Results  Component Value Date   CHOL 213 (H) 04/20/2021   HDL 45.80 04/20/2021   LDLCALC 148 (H) 04/20/2021   LDLDIRECT 178.0 11/18/2012   TRIG 98.0 04/20/2021   CHOLHDL 5 04/20/2021       Latest Ref Rng & Units 09/24/2021    7:07 PM 02/28/2021   10:49 AM 10/27/2020    9:27 PM   Hepatic Function  Total Protein 6.5 - 8.1 g/dL 7.7  7.5  8.3   Albumin 3.5 - 5.0 g/dL 3.9  4.1  4.0   AST 15 - 41 U/L '20  11  16   '$ ALT 0 - 44 U/L '10  6  11   '$ Alk Phosphatase 38 - 126 U/L 66  103  84   Total Bilirubin 0.3 - 1.2 mg/dL 0.7  <0.2  0.6   Bilirubin, Direct 0.0 - 0.2 mg/dL   <0.1     Lab Results  Component Value Date/Time   TSH 1.080 10/06/2020 09:51 AM   TSH 0.65 11/18/2012 10:53 AM       Latest Ref Rng & Units 09/24/2021    7:07 PM 02/28/2021   10:49 AM 10/27/2020    6:45 PM  CBC  WBC 4.0 - 10.5 K/uL 4.9  3.9  5.2   Hemoglobin 12.0 - 15.0 g/dL 11.9  12.6  13.6   Hematocrit 36.0 - 46.0 % 35.3  37.6  37.6   Platelets 150 - 400 K/uL 224  240  211     Lab Results  Component Value Date/Time   VITAMINB12 510 10/06/2020 09:51 AM   VITAMINB12 510 11/18/2012 10:53 AM    Clinical ASCVD: {YES/NO:21197} The 10-year ASCVD risk score (Arnett DK, et al., 2019) is:  23.8%   Values used to calculate the score:     Age: 60 years     Sex: Female     Is Non-Hispanic African American: Yes     Diabetic: Yes     Tobacco smoker: No     Systolic Blood Pressure: 95 mmHg     Is BP treated: No     HDL Cholesterol: 45.8 mg/dL     Total Cholesterol: 213 mg/dL    ***Other: (CHADS2VASc if Afib, MMRC or CAT for COPD, ACT, DEXA)     07/11/2021   10:56 AM 07/11/2021   10:52 AM 04/25/2021   10:31 AM  Depression screen PHQ 2/9  Decreased Interest 0 0 0  Down, Depressed, Hopeless 0 0 0  PHQ - 2 Score 0 0 0     Social History   Tobacco Use  Smoking Status Never  Smokeless Tobacco Never   BP Readings from Last 3 Encounters:  03/01/22 95/60  09/24/21 (!) 149/83  09/04/21 138/80   Pulse Readings from Last 3 Encounters:  03/01/22 76  09/24/21 77  09/04/21 65   Wt Readings from Last 3 Encounters:  03/01/22 180 lb (81.6 kg)  09/24/21 180 lb (81.6 kg)  09/04/21 150 lb 9.6 oz (68.3 kg)   BMI Readings from Last 3 Encounters:  03/01/22 32.92 kg/m  09/24/21 32.92 kg/m   09/04/21 29.41 kg/m    No Known Allergies  Medications Reviewed Today     Reviewed by Andre Lefort, CMA (Certified Medical Assistant) on 03/01/22 at 26  Med List Status: <None>   Medication Order Taking? Sig Documenting Provider Last Dose Status Informant  blood glucose meter kit and supplies KIT [id number] Yes Dispense based on patient and insurance preference. Check glucose daily, E11.65 Nche, Charlene Brooke, NP Taking Active   glucose blood (ACCU-CHEK GUIDE) test strip [id number] Yes TEST DAILY Nche, Charlene Brooke, NP Taking Active   Lacosamide 150 MG TABS [id number] Yes Take 1 tablet (150 mg total) by mouth 2 (two) times daily. Alric Ran, MD Taking Active   metFORMIN (GLUCOPHAGE) 500 MG tablet [id number] Yes Take 0.5 tablets (250 mg total) by mouth 2 (two) times daily with a meal. Nche, Charlene Brooke, NP Taking Active   omeprazole (PRILOSEC) 40 MG capsule [id number] Yes Take 1 capsule (40 mg total) by mouth 2 (two) times daily. Flossie Buffy, NP Taking Active   OXcarbazepine (TRILEPTAL) 150 MG tablet [id number] Yes TAKE 1 TABLET BY MOUTH TWICE A DAY Suzzanne Cloud, NP Taking Active   rosuvastatin (CRESTOR) 20 MG tablet [id number] Yes Take 1 tablet (20 mg total) by mouth daily. Flossie Buffy, NP Taking Active   thiamine 100 MG tablet [id number] Yes Take 1 tablet by mouth every Monday, Wednesday, and Friday. [provider] Taking Active             SDOH:  (Social Determinants of Health) assessments and interventions performed: {yes/no:20286} SDOH Interventions    Flowsheet Row Chronic Care Management from 10/12/2021 in [facility] at [place] from 07/11/2021 in [facility] at [place] Interventions -- Intervention Not Indicated  Housing Interventions -- Intervention Not Indicated  Transportation Interventions Intervention Not Indicated Intervention  Not Indicated  Financial Strain Interventions Intervention Not Indicated Intervention Not Indicated  Physical Activity Interventions -- Intervention Not Indicated  Stress Interventions -- Intervention Not Indicated  Social Connections Interventions -- Intervention Not Indicated  Medication Assistance: {MEDASSISTANCEINFO:25044}  Medication Access: Within the past 30 days, how often has patient missed a dose of medication? *** Is a pillbox or other method used to improve adherence? {YES/NO:21197} Factors that may affect medication adherence? {CHL DESC; BARRIERS:21522} Are meds synced by current pharmacy? {YES/NO:21197} Are meds delivered by current pharmacy? {YES/NO:21197} Does patient experience delays in picking up medications due to transportation concerns? {YES/NO:21197}  [facility] Reviewed: Is patient disadvantaged to use [facility]?: {YES/NO:21197} Current Rx insurance plan: *** Name and location of Current pharmacy:  [facility] - [place], [place] - [address] [place] [address] Phone: [phone] Fax: [phone]  [id number] [place] [place]Phone: [phone]: [phone] [facility] services reviewed with patient today?: {YES/NO:21197} Patient requests to transfer care to [facility]?: {YES/NO:21197} Reason patient declined to change pharmacies: {US patient preference:27474}  Compliance/Adherence/Medication fill history: Care Gaps: ***  Star-Rating Drugs: ***   Assessment/Plan  Hypertension (BP goal <130/80) -Controlled -Current treatment: None -Medications previously tried: NA  -Recommended to continue current medication  Hyperlipidemia: (LDL goal < 70) -Uncontrolled -Current treatment: None -Medications previously tried: NA -Prescribed  rosuvastatin, patient elected to discontinue due to pill burden and lack of understanding regarding indication of medication. Counseled patient's daughter on importance of controlling cholesterol to minimize risk of heart disease, especially given patients very high 10-year risk score.   -Patient is a candidate for high-intensity statin.  -RESTART Rosuvastatin  Diabetes (A1c goal <7%) -Controlled -Current medications: Metformin 250 mg 1/2 tablet twice daily  -Medications previously tried: NA  -Current home glucose readings Random: 103  -Denies hypoglycemic/hyperglycemic symptoms -Recommended to continue current medication  History of Seizures (Goal: Prevent seizures) -Controlled -Managed by Dr. CApril Manson -Current treatment  Lacosamide 150 mg twice daily  Oxcarbazepine 150 mg twice daily -Medications previously tried: NA  -Recommended to continue current medication  GERD (Goal: Minimize symptoms) -Controlled -History of diaphragmatic hernia  -Current treatment  Omeprazole 40 mg twice daily  -Medications previously tried: NA  -Recommended to continue current medication  ***

## 2022-03-01 NOTE — Telephone Encounter (Signed)
Care Management & Coordination Services Outreach Note  03/01/2022 Name: Sabrina Guerrero MRN: [id number] DOB: 1945/04/15  Referred by: Flossie Buffy, NP  Patient had a phone appointment scheduled with clinical pharmacist today.  An unsuccessful telephone outreach was attempted today. The patient was referred to the pharmacist for assistance with medications, care management and care coordination.   Patient will NOT be penalized in any way for missing a Care Management & Coordination Services appointment. The no-show fee does not apply.  Junius Argyle, PharmD, [place], CPP Clinical Pharmacist Practitioner  [facility]  [phone]

## 2022-03-01 NOTE — Progress Notes (Signed)
PATIENT: Sabrina Guerrero DOB: 1945-09-26  REASON FOR VISIT: Follow up for seizures HISTORY FROM: Patient  PRIMARY NEUROLOGIST: Dr. April Manson  HISTORY OF PRESENT ILLNESS: Today 03/01/22 Here alone, she drove here. Doing well. No seizures. Medications listed are: Trileptal 150 mg BID, Vimpat 150 mg BID. Back in Sept 2022 Dr. Jannifer Franklin has increased her Trileptal to 300 mg BID due to seizures.  I called the pharmacy, the last time she refilled Trileptal at the 300 mg strength was in March 2023.  Since then she has been on 150 mg twice a day.   08/29/21 Dr. April Manson: Patient presents today for follow-up, she is accompanied by her daughter.  Last visit was in January and since then she denies any additional seizures or seizure-like activity.  She is currently on Vimpat 100 mg twice daily and Trileptal 300 mg twice daily, denies any side effect of the medication and reported tolerance.  INTERVAL HISTORY 02/28/21:  Sabrina Guerrero here today for follow-up with history of seizures.  Had about 3 seizures in 2022, at last visit with Dr. Jannifer Franklin, Trileptal was increased to 300 mg twice daily. EEG was normal in November 2022.  Vimpat was added in November 2022 due to breakthrough seizures. 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 December 2021 had brain surgery for SDH. She lives with her daughter. Walks 20 minutes almost daily. No falls.   HISTORY  10/06/20 Dr. Jannifer Franklin: Sabrina Guerrero is a 77 year old right-handed black female with a history of diabetes and a history of seizures.  The patient has been well controlled on a very low-dose of Trileptal taking 150 mg twice daily for a number of years.  Within the last year, she did have a fall and  developed bilateral subdural hematoma requiring surgery.  She had fallen in October 2021, presented to the hospital on 01 February 2020 with gait instability and confusion.  She lives with her daughter who has noted that over the last several months she has sustained 3 witnessed seizure events.  The patient would not remember whether or not she had a seizure if she were alone.  The patient will have a 10 to 15-minute episode of staring off and associated confusion with gradual return to normal baseline.  The patient has developed a memory disorder, she has been set up for formal neuropsychological testing in November 2022.  She is no longer operating a motor vehicle.  She does report occasional episodes of soreness in the head that may come and go, she has had 4-5 such episodes since her surgery for the subdural hematoma.  She returns to this office for an evaluation.  REVIEW OF SYSTEMS: Out of a complete 14 system review of symptoms, the patient complains only of the following symptoms, and all other reviewed systems are negative.  See HPI  ALLERGIES: No Known Allergies  HOME MEDICATIONS: Outpatient Medications Prior to Visit  Medication Sig Dispense Refill   blood glucose meter kit and supplies KIT Dispense based on patient and insurance preference. Check glucose daily, E11.65 1 each 0   glucose blood (ACCU-CHEK GUIDE) test strip TEST DAILY 100 strip 2   Lacosamide 150 MG TABS Take 1 tablet (150 mg total) by mouth 2 (two) times daily. 180 tablet 1   metFORMIN (GLUCOPHAGE)  500 MG tablet Take 0.5 tablets (250 mg total) by mouth 2 (two) times daily with a meal. 90 tablet 1   omeprazole (PRILOSEC) 40 MG capsule Take 1 capsule (40 mg total) by mouth 2 (two) times daily. 180 capsule 1   OXcarbazepine (TRILEPTAL) 150 MG tablet TAKE 1 TABLET BY MOUTH TWICE A DAY 180 tablet 1   rosuvastatin (CRESTOR) 20 MG tablet Take 1 tablet (20 mg total) by mouth daily. 90 tablet 1   thiamine 100 MG tablet Take 1  tablet by mouth every Monday, Wednesday, and Friday.     No facility-administered medications prior to visit.    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, requiring surgery   Memory deficit 05/20/2014   Partial symptomatic epilepsy with complex partial seizures, not intractable, without status epilepticus ([place]) 05/20/2014   most recent 09/10/20   Ventral hernia     PAST SURGICAL HISTORY: Past Surgical History:  Procedure Laterality Date   COLONOSCOPY WITH PROPOFOL N/A 12/08/2020   Procedure: COLONOSCOPY WITH PROPOFOL;  Surgeon: Jonathon Bellows, MD;  Location: [facility] ENDOSCOPY;  Service: Gastroenterology;  Laterality: N/A;   ESOPHAGOGASTRODUODENOSCOPY N/A 12/08/2020   Procedure: ESOPHAGOGASTRODUODENOSCOPY (EGD);  Surgeon: Jonathon Bellows, MD;  Location: [facility] ENDOSCOPY;  Service: Gastroenterology;  Laterality: N/A;   SUBDURAL HEMATOMA EVACUATION VIA CRANIOTOMY  01/2020   TUBAL LIGATION     VENTRAL HERNIA REPAIR      FAMILY HISTORY: Family History  Problem Relation Age of Onset   Arthritis Mother    Cancer Brother        lung   Colon cancer Neg Hx    Esophageal cancer Neg Hx    Stomach cancer Neg Hx    Liver disease Neg Hx     SOCIAL HISTORY: Social History   Socioeconomic History   Marital status: Divorced    Spouse name: Not on file   Number of children: 3   Years of education: 18 years   Highest education level: Not on file  Occupational History   Occupation: unemployed  Tobacco Use   Smoking status: Never   Smokeless tobacco: Never  Vaping Use   Vaping Use: Never used  Substance and Sexual Activity   Alcohol use: No   Drug use: No   Sexual activity: Never  Other Topics Concern   Not on file  Social History Narrative   10/06/20 lives with dgtr   Patient is right handed.   Patient does not drink  caffeine.   Social Determinants of Health   Financial Resource Strain: Low Risk  (10/12/2021)   Overall Financial Resource Strain (CARDIA)    Difficulty of Paying Living Expenses: Not hard at all  Food Insecurity: No Food Insecurity (07/11/2021)   Hunger Vital Sign    Worried About Running Out of Food in the Last Year: Never true    Ran Out of Food in the Last Year: Never true  Transportation Needs: No Transportation Needs (10/12/2021)   PRAPARE - Hydrologist (Medical): No    Lack of Transportation (Non-Medical): No  Physical Activity: Sufficiently Active (07/11/2021)   Exercise Vital Sign    Days of Exercise per Week: 5 days    Minutes of Exercise per Session: 30 min  Stress: No Stress Concern Present (07/11/2021)   [place]  Stress Questionnaire    Feeling of Stress : Not at all  Social Connections: Moderately Isolated (07/11/2021)   Social Connection and Isolation Panel [NHANES]    Frequency of Communication with Friends and Family: Three times a week    Frequency of Social Gatherings with Friends and Family: Three times a week    Attends Religious Services: More than 4 times per year    Active Member of Clubs or Organizations: No    Attends Archivist Meetings: Never    Marital Status: Divorced  Guerrero resources officer Violence: Not At Risk (07/11/2021)   Humiliation, Afraid, Rape, and Kick questionnaire    Fear of Current or Ex-Partner: No    Emotionally Abused: No    Physically Abused: No    Sexually Abused: No   PHYSICAL EXAM  Vitals:   03/01/22 1109  BP: 95/60  Pulse: 76  Weight: 180 lb (81.6 kg)  Height: '5\' 2"'$  (1.575 m)   Body mass index is 32.92 kg/m.  Generalized: Well developed, in no acute distress   Neurological examination  Mentation: Alert oriented to time, place, history taking. Follows all commands speech and language fluent Cranial nerve II-XII: Pupils were equal round reactive to  light. Extraocular movements were full, visual field were full on confrontational test. Facial sensation and strength were normal. Head turning and shoulder shrug  were normal and symmetric. Motor: Good strength all extremities Sensory: Sensory testing is intact to soft touch on all 4 extremities. No evidence of extinction is noted.  Coordination: Cerebellar testing reveals good finger-nose-finger and heel-to-shin bilaterally.  Gait and station: Gait is normal, independent  DIAGNOSTIC DATA (LABS, IMAGING, TESTING) - I reviewed patient records, labs, notes, testing and imaging myself where available.  Lab Results  Component Value Date   WBC 4.9 09/24/2021   HGB 11.9 (L) 09/24/2021   HCT 35.3 (L) 09/24/2021   MCV 94.4 09/24/2021   PLT 224 09/24/2021      Component Value Date/Time   NA 128 (L) 09/24/2021 1907   NA 134 08/29/2021 1114   K 3.8 09/24/2021 1907   CL 94 (L) 09/24/2021 1907   CO2 25 09/24/2021 1907   GLUCOSE 145 (H) 09/24/2021 1907   BUN 7 (L) 09/24/2021 1907   BUN 8 08/29/2021 1114   CREATININE 0.59 09/24/2021 1907   CALCIUM 9.0 09/24/2021 1907   PROT 7.7 09/24/2021 1907   PROT 7.5 02/28/2021 1049   ALBUMIN 3.9 09/24/2021 1907   ALBUMIN 4.1 02/28/2021 1049   AST 20 09/24/2021 1907   ALT 10 09/24/2021 1907   ALKPHOS 66 09/24/2021 1907   BILITOT 0.7 09/24/2021 1907   BILITOT <0.2 02/28/2021 1049   GFRNONAA >60 09/24/2021 1907   GFRAA 81 04/01/2019 1407   Lab Results  Component Value Date   CHOL 213 (H) 04/20/2021   HDL 45.80 04/20/2021   LDLCALC 148 (H) 04/20/2021   LDLDIRECT 178.0 11/18/2012   TRIG 98.0 04/20/2021   CHOLHDL 5 04/20/2021   Lab Results  Component Value Date   HGBA1C 6.5 (A) 09/04/2021   Lab Results  Component Value Date   VITAMINB12 510 10/06/2020   Lab Results  Component Value Date   TSH 1.080 10/06/2020   ASSESSMENT AND PLAN 77 y.o. year old female  has a past medical history of Diabetes mellitus without complication ([place]),  Gastroesophageal reflux disease, History of subdural hematoma (post traumatic) (03/23/2020), Memory deficit (05/20/2014), Partial symptomatic epilepsy with complex partial seizures, not intractable, without status epilepticus ([place]) (05/20/2014), and  Ventral hernia. here with:  1.  History of seizures, last was in November 2022 2.  History of subdural hematoma  -In November 2022 with Dr. Jannifer Franklin Trileptal was increased from 150 mg twice a day to 300 mg twice a day due to reported seizure, I called the pharmacy, last refilled Trileptal 300 mg twice a day in March 2023, since then has been on 150 mg twice daily, looks like in the ER 09/24/21 for dizziness, sodium was 128, no medication change seen -She has done well on Trileptal 150 mg twice a day, Vimpat 150 mg twice a day; she was previously on long-term low-dose Trileptal 150 mg, am inclined to continue with this dosing -Check labs today -I called her daughter, no seizures reported, continue with medication as prescribed below  -Follow-up in 6 months or sooner if needed  Butler Denmark, Laqueta Jean, DNP  [facility] [address] [place], [place] [address] [phone]

## 2022-03-07 LAB — LACOSAMIDE: Lacosamide: 13.6 ug/mL — ABNORMAL HIGH (ref 5.0–10.0)

## 2022-03-07 LAB — COMPREHENSIVE METABOLIC PANEL
ALT: 6 IU/L (ref 0–32)
AST: 14 IU/L (ref 0–40)
Albumin/Globulin Ratio: 1.4 (ref 1.2–2.2)
Albumin: 4.2 g/dL (ref 3.8–4.8)
Alkaline Phosphatase: 77 IU/L (ref 44–121)
BUN/Creatinine Ratio: 11 — ABNORMAL LOW (ref 12–28)
BUN: 8 mg/dL (ref 8–27)
Bilirubin Total: 0.4 mg/dL (ref 0.0–1.2)
CO2: 23 mmol/L (ref 20–29)
Calcium: 9.7 mg/dL (ref 8.7–10.3)
Chloride: 95 mmol/L — ABNORMAL LOW (ref 96–106)
Creatinine, Ser: 0.76 mg/dL (ref 0.57–1.00)
Globulin, Total: 3.1 g/dL (ref 1.5–4.5)
Glucose: 94 mg/dL (ref 70–99)
Potassium: 4.2 mmol/L (ref 3.5–5.2)
Sodium: 132 mmol/L — ABNORMAL LOW (ref 134–144)
Total Protein: 7.3 g/dL (ref 6.0–8.5)
eGFR: 82 mL/min/{1.73_m2} (ref >59)

## 2022-03-07 LAB — 10-HYDROXYCARBAZEPINE: Oxcarbazepine SerPl-Mcnc: 7 ug/mL — ABNORMAL LOW (ref 10–35)

## 2022-03-12 ENCOUNTER — Telehealth: Payer: Self-pay

## 2022-03-12 NOTE — Progress Notes (Signed)
Care Management & Coordination Services Pharmacy Team  Reason for Encounter: Medication coordination and delivery  Contacted patient to discuss medications and coordinate delivery from [facility].  Spoke with family on 03/12/2022   Cycle dispensing form sent to [name] for review.   Last adherence delivery date:02/21/2022      Patient is due for next adherence delivery on: 03/22/2022  This delivery to include: Vials  30 Days  Omeprazole 40 mg - 1 capsule 2 times daily - Breakfast, Bedtime  Lacosamide 150 mg - 1 tablet 2 times daily - Before Breakfast, Evening meals   Patient declined the following medications this month: Metformin 500 mg -  0.5 tablets 2 times daily - Breakfast, Bedtime -Adequate supply, last filled 01/16/2022 for [facility] day supply at [facility].Patient daughter states she prefers to get this from [facility]. Oxacrbazepine 150 mg- 1 tablet 2 times daily - Before Breakfast, Evening meals-Adequate supply, last filled 01/16/2022 for [facility] day supply at [facility].Patient daughter states she prefers to get this from [facility]. Rosuvastatin 20 mg-  1 tablet daily - Breakfast- Adequate supply, last filled 01/16/2022 for [facility] day supply at [facility].Patient daughter states she prefers to get this from [facility]. Vitamin B1 100 mg- 1 tablet daily - Bedtime- Adequate supply  No refill request needed.  Confirmed delivery date of 03/22/2022 (Second Route), advised patient that pharmacy will contact them the morning of delivery.   Any concerns about your medications? No  How often do you forget or accidentally miss a dose? Rarely  Is patient in packaging No  If yes  What is the date on your next pill pack?  Any concerns or issues with your packaging?  Patient daughter agree to schedule a Telephone follow up appointment with the Clinical pharmacist for : 04/12/2022  at 1:00 pm.   Chart review: Recent office visits:  None ID  Recent consult visits:   03/01/2022 Butler Denmark NP (Neurology) No Medication changes noted,Follow-up in 6 months   Hospital visits:  None in previous 6 months  Care Gaps: COVID-19 Vaccine Influenza Vaccine Shingrix Vaccine Dtap Vaccine Hemoglobin A1C   Star Rating Drugs: Metformin 500 mg last filled on 01/16/2022 for 90 day supply at [facility].  Rosuvastatin 20 mg last filled on 01/16/2022 for [facility] day supply [facility].   Medications: Outpatient Encounter Medications as of 03/12/2022  Medication Sig   blood glucose meter kit and supplies KIT Dispense based on patient and insurance preference. Check glucose daily, E11.65   glucose blood (ACCU-CHEK GUIDE) test strip TEST DAILY   Lacosamide 150 MG TABS Take 1 tablet (150 mg total) by mouth 2 (two) times daily.   metFORMIN (GLUCOPHAGE) 500 MG tablet Take 0.5 tablets (250 mg total) by mouth 2 (two) times daily with a meal.   omeprazole (PRILOSEC) 40 MG capsule Take 1 capsule (40 mg total) by mouth 2 (two) times daily.   OXcarbazepine (TRILEPTAL) 150 MG tablet Take 1 tablet (150 mg total) by mouth 2 (two) times daily.   rosuvastatin (CRESTOR) 20 MG tablet Take 1 tablet (20 mg total) by mouth daily.   thiamine 100 MG tablet Take 1 tablet by mouth every Monday, Wednesday, and Friday.   No facility-administered encounter medications on file as of 03/12/2022.   BP Readings from Last 3 Encounters:  03/01/22 95/60  09/24/21 (!) 149/83  09/04/21 138/80    Pulse Readings from Last 3 Encounters:  03/01/22 76  09/24/21 77  09/04/21 65    Lab Results  Component Value  Date/Time   HGBA1C 6.5 (A) 09/04/2021 01:41 PM   HGBA1C 6.8 (H) 04/20/2021 11:09 AM   HGBA1C 6.5 11/18/2012 10:53 AM   Lab Results  Component Value Date   CREATININE 0.76 03/01/2022   BUN 8 03/01/2022   GFR 83.36 04/20/2021   GFRNONAA >60 09/24/2021   GFRAA 81 04/01/2019   NA 132 (L) 03/01/2022   K 4.2 03/01/2022   CALCIUM 9.7 03/01/2022   CO2 23 03/01/2022     Bessie  Askov Clinical Pharmacist Assistant [phone]

## 2022-04-07 ENCOUNTER — Other Ambulatory Visit: Payer: Self-pay | Admitting: Nurse Practitioner

## 2022-04-07 DIAGNOSIS — E1169 Type 2 diabetes mellitus with other specified complication: Secondary | ICD-10-CM

## 2022-04-09 ENCOUNTER — Other Ambulatory Visit: Payer: Self-pay

## 2022-04-09 DIAGNOSIS — E785 Hyperlipidemia, unspecified: Secondary | ICD-10-CM

## 2022-04-09 MED ORDER — ROSUVASTATIN CALCIUM 20 MG PO TABS: 20.0000 mg | ORAL_TABLET | Freq: Every day | ORAL | 0 refills | Status: DC

## 2022-04-11 ENCOUNTER — Telehealth: Payer: Self-pay

## 2022-04-11 NOTE — Progress Notes (Signed)
Care Management & Coordination Services Pharmacy Team  Reason for Encounter: Medication coordination and delivery  Contacted patient to discuss medications and coordinate delivery from [facility].  Spoke with  patient daughter  on 04/11/2022   Cycle dispensing form sent to [name] for review.   Last adherence delivery date:03/22/2022      Patient is due for next adherence delivery on: 04/23/2022  This delivery to include: Vials  30 Days  Omeprazole 40 mg - 1 capsule 2 times daily - Breakfast, Bedtime  Lacosamide 150 mg - 1 tablet 2 times daily - Before Breakfast, Evening meals   Patient declined the following medications this month: Metformin 500 mg -  0.5 tablets 2 times daily - Breakfast, Bedtime -Adequate supply, last filled 01/16/2022 [facility].Patient daughter states she prefers to get this [facility]. Oxacrbazepine 150 mg- 1 tablet 2 times daily - Before Breakfast, Evening meals-Adequate supply, last filled 01/16/2022 [facility].Patient daughter states she prefers to get this from [facility]. Rosuvastatin 20 mg-  1 tablet daily - Breakfast- Adequate supply, last filled 01/16/2022 [facility] day [facility].Patient daughter states she prefers to get this from [facility]. Vitamin B1 100 mg- 1 tablet daily - Bedtime- Adequate supply  Refills requested from providers include: Omeprazole prescribe by PCP.  Confirmed delivery date of 04/23/2022 (Second route), advised patient that pharmacy will contact them the morning of delivery.   Any concerns about your medications? No  How often do you forget or accidentally miss a dose? Never  Do you use a pillbox? No  Is patient in packaging No  Care Gaps: COVID-19 Vaccine Shingrix Vaccine Dtap Vaccine Hemoglobin A1C   Star Rating Drugs: Metformin 500 mg last filled on 01/16/2022 [facility].  Rosuvastatin 20 mg last filled on  01/16/2022 [facility].   Chart review: Recent office visits:  None ID  Recent consult visits:  None ID  Hospital visits:  None in previous 6 months  Medications: Outpatient Encounter Medications as of 04/11/2022  Medication Sig   blood glucose meter kit and supplies KIT Dispense based on patient and insurance preference. Check glucose daily, E11.65   glucose blood (ACCU-CHEK GUIDE) test strip TEST DAILY   Lacosamide 150 MG TABS Take 1 tablet (150 mg total) by mouth 2 (two) times daily.   metFORMIN (GLUCOPHAGE) 500 MG tablet Take 0.5 tablets (250 mg total) by mouth 2 (two) times daily with a meal.   omeprazole (PRILOSEC) 40 MG capsule Take 1 capsule (40 mg total) by mouth 2 (two) times daily.   OXcarbazepine (TRILEPTAL) 150 MG tablet Take 1 tablet (150 mg total) by mouth 2 (two) times daily.   rosuvastatin (CRESTOR) 20 MG tablet Take 1 tablet (20 mg total) by mouth daily.   thiamine 100 MG tablet Take 1 tablet by mouth every Monday, Wednesday, and Friday.   No facility-administered encounter medications on file as of 04/11/2022.   BP Readings from Last 3 Encounters:  03/01/22 95/60  09/24/21 (!) 149/83  09/04/21 138/80    Pulse Readings from Last 3 Encounters:  03/01/22 76  09/24/21 77  09/04/21 65    Lab Results  Component Value Date/Time   HGBA1C 6.5 (A) 09/04/2021 01:41 PM   HGBA1C 6.8 (H) 04/20/2021 11:09 AM   HGBA1C 6.5 11/18/2012 10:53 AM   Lab Results  Component Value Date   CREATININE 0.76 03/01/2022   BUN 8 03/01/2022  GFR 83.36 04/20/2021   GFRNONAA >60 09/24/2021   GFRAA 81 04/01/2019   NA 132 (L) 03/01/2022   K 4.2 03/01/2022   CALCIUM 9.7 03/01/2022   CO2 23 03/01/2022     Bessie Rochelle Pharmacist Assistant [phone]

## 2022-04-12 ENCOUNTER — Ambulatory Visit: Payer: No Typology Code available for payment source

## 2022-04-12 DIAGNOSIS — E1169 Type 2 diabetes mellitus with other specified complication: Secondary | ICD-10-CM

## 2022-04-12 DIAGNOSIS — E1165 Type 2 diabetes mellitus with hyperglycemia: Secondary | ICD-10-CM

## 2022-04-12 NOTE — Progress Notes (Signed)
Care Management & Coordination Services Pharmacy Note  04/12/2022 Name:  Sabrina Guerrero MRN:  [id number] DOB:  April 07, 1945  Summary: Patient presents for follow-up pharmacy consult.   -Patient previously with adherence concerns with rosuvastatin. Patient's daughter is unsure if patient has been taking this medication lately, but will check. Rosuvastatin has been routinely filled.   -Patient appears to be overdue for PCP follow-up/labwork.   Recommendations/Changes made from today's visit:  -Continue current medications, recheck LDL at PCP follow-up   Follow up plan: CPP follow-up 6 months  Patient's daughter will schedule follow-up with PCP today.    Subjective: Sabrina Guerrero is an 77 y.o. year old female who is a primary patient of Nche, Charlene Brooke, NP.  The care coordination team was consulted for assistance with disease management and care coordination needs.    Engaged with patient by telephone for follow up visit.  Recent office visits: 09/04/2021 Wilfred Lacy NP (PCP) No medication changes noted, AMB Referral to [facility], Return in about 3 months    Recent consult visits: 03/01/21: Patient presented to Butler Denmark, NP (Neurology for follow-up.   Hospital visits: None in previous 6 months   Objective:  Lab Results  Component Value Date   CREATININE 0.76 03/01/2022   BUN 8 03/01/2022   GFR 83.36 04/20/2021   EGFR 82 03/01/2022   GFRNONAA >60 09/24/2021   GFRAA 81 04/01/2019   NA 132 (L) 03/01/2022   K 4.2 03/01/2022   CALCIUM 9.7 03/01/2022   CO2 23 03/01/2022   GLUCOSE 94 03/01/2022    Lab Results  Component Value Date/Time   HGBA1C 6.5 (A) 09/04/2021 01:41 PM   HGBA1C 6.8 (H) 04/20/2021 11:09 AM   HGBA1C 6.5 11/18/2012 10:53 AM   GFR 83.36 04/20/2021 11:09 AM   GFR 93.45 11/18/2012 10:53 AM   MICROALBUR 1.0 09/04/2021 01:53 PM    Last diabetic Eye exam:  Lab Results  Component Value Date/Time   HMDIABEYEEXA No Retinopathy  01/24/2022 01:57 PM    Last diabetic Foot exam:  Lab Results  Component Value Date/Time   HMDIABFOOTEX NORMAL 08/16/2021 12:00 AM     Lab Results  Component Value Date   CHOL 213 (H) 04/20/2021   HDL 45.80 04/20/2021   LDLCALC 148 (H) 04/20/2021   LDLDIRECT 178.0 11/18/2012   TRIG 98.0 04/20/2021   CHOLHDL 5 04/20/2021       Latest Ref Rng & Units 03/01/2022   11:27 AM 09/24/2021    7:07 PM 02/28/2021   10:49 AM  Hepatic Function  Total Protein 6.0 - 8.5 g/dL 7.3  7.7  7.5   Albumin 3.8 - 4.8 g/dL 4.2  3.9  4.1   AST 0 - 40 IU/L '14  20  11   '$ ALT 0 - 32 IU/L '6  10  6   '$ Alk Phosphatase 44 - 121 IU/L 77  66  103   Total Bilirubin 0.0 - 1.2 mg/dL 0.4  0.7  <0.2     Lab Results  Component Value Date/Time   TSH 1.080 10/06/2020 09:51 AM   TSH 0.65 11/18/2012 10:53 AM       Latest Ref Rng & Units 09/24/2021    7:07 PM 02/28/2021   10:49 AM 10/27/2020    6:45 PM  CBC  WBC 4.0 - 10.5 K/uL 4.9  3.9  5.2   Hemoglobin 12.0 - 15.0 g/dL 11.9  12.6  13.6   Hematocrit 36.0 - 46.0 % 35.3  37.6  37.6  Platelets 150 - 400 K/uL 224  240  211     Lab Results  Component Value Date/Time   VITAMINB12 510 10/06/2020 09:51 AM   VITAMINB12 510 11/18/2012 10:53 AM    Clinical ASCVD: No  The 10-year ASCVD risk score (Arnett DK, et al., 2019) is: 25.6%   Values used to calculate the score:     Age: 77 years     Sex: Female     Is Non-Hispanic African American: Yes     Diabetic: Yes     Tobacco smoker: No     Systolic Blood Pressure: 95 mmHg     Is BP treated: No     HDL Cholesterol: 45.8 mg/dL     Total Cholesterol: 213 mg/dL       07/11/2021   10:56 AM 07/11/2021   10:52 AM 04/25/2021   10:31 AM  Depression screen PHQ 2/9  Decreased Interest 0 0 0  Down, Depressed, Hopeless 0 0 0  PHQ - 2 Score 0 0 0     Social History   Tobacco Use  Smoking Status Never  Smokeless Tobacco Never   BP Readings from Last 3 Encounters:  03/01/22 95/60  09/24/21 (!) 149/83  09/04/21  138/80   Pulse Readings from Last 3 Encounters:  03/01/22 76  09/24/21 77  09/04/21 65   Wt Readings from Last 3 Encounters:  03/01/22 180 lb (81.6 kg)  09/24/21 180 lb (81.6 kg)  09/04/21 150 lb 9.6 oz (68.3 kg)   BMI Readings from Last 3 Encounters:  03/01/22 32.92 kg/m  09/24/21 32.92 kg/m  09/04/21 29.41 kg/m    No Known Allergies  Medications Reviewed Today     Reviewed by Andre Lefort, CMA (Certified Medical Assistant) on 03/01/22 at 80  Med List Status: <None>   Medication Order Taking? Sig Documenting Provider Last Dose Status Informant  blood glucose meter kit and supplies [id number] Yes Dispense based on patient and insurance preference. Check glucose daily, E11.65 Nche, Charlene Brooke, NP Taking Active   glucose blood (ACCU-CHEK GUIDE) test strip [id number] Yes TEST DAILY Nche, Charlene Brooke, NP Taking Active   Lacosamide 150 MG TABS [id number] Yes Take 1 tablet (150 mg total) by mouth 2 (two) times daily. Alric Ran, MD Taking Active   metFORMIN (GLUCOPHAGE) 500 MG tablet [id number] Yes Take 0.5 tablets (250 mg total) by mouth 2 (two) times daily with a meal. Nche, Charlene Brooke, NP Taking Active   omeprazole (PRILOSEC) 40 MG capsule [id number] Yes Take 1 capsule (40 mg total) by mouth 2 (two) times daily. Flossie Buffy, NP Taking Active   OXcarbazepine (TRILEPTAL) 150 MG tablet [id number] Yes TAKE 1 TABLET BY MOUTH TWICE A DAY Suzzanne Cloud, NP Taking Active   rosuvastatin (CRESTOR) 20 MG tablet [id number] Yes Take 1 tablet (20 mg total) by mouth daily. Flossie Buffy, NP Taking Active   thiamine 100 MG tablet [id number] Yes Take 1 tablet by mouth every Monday, Wednesday, and Friday. [provider] Taking Active             SDOH:  (Social Determinants of Health) assessments and interventions performed: Yes SDOH Interventions    Flowsheet Row Chronic Care Management from 10/12/2021 in [place] [facility] [place] from 07/11/2021 in [place] [facility] [place] Interventions    Food Insecurity Interventions -- Intervention Not Indicated  Housing Interventions -- Intervention Not Indicated  Transportation Interventions Intervention Not  Indicated Intervention Not Indicated  Financial Strain Interventions Intervention Not Indicated Intervention Not Indicated  Physical Activity Interventions -- Intervention Not Indicated  Stress Interventions -- Intervention Not Indicated  Social Connections Interventions -- Intervention Not Indicated       Medication Assistance: None required.  Patient affirms current coverage meets needs.  Medication Access: Within the past 30 days, how often has patient missed a dose of medication? None Is a pillbox or other method used to improve adherence? Yes  Factors that may affect medication adherence? no barriers identified Are meds synced by current pharmacy? No  Are meds delivered by current pharmacy? No  Does patient experience delays in picking up medications due to transportation concerns? No   [facility] Reviewed: Is patient disadvantaged to use [facility]?: No  Current Rx insurance plan: [facility] Name and location of Current pharmacy:  [facility] - [place], [place] - [place] [address] [place] [address] Phone: [phone] Fax: [phone]  [id number] [address] [id number] [id number]: [phone]: [phone] [facility] services reviewed with patient today?: No  Patient requests to transfer care to [facility]?: No  Reason patient declined to change pharmacies: Resistance to change and Previously enrolled [facility]  Compliance/Adherence/Medication fill history: Care Gaps: Tdap Shingrix Covid  Booster A1c  Star-Rating Drugs:  Metformin 500 mg: Last filled 01/16/22 for 90-DS Rosuvastatin 20 mg: Last filled 01/16/22 for 90-DS     Assessment/Plan  Hypertension (BP goal <130/80) -Controlled -Current treatment: None -Medications previously tried: NA  -Recommended to continue current medication  Hyperlipidemia: (LDL goal < 70) -Uncontrolled -Current treatment: Rosuvastatin 10 mg daily  -Medications previously tried: NA -Patient previously with adherence concerns with rosuvastatin. Patient's daughter is unsure if patient has been taking this medication lately, but will check. Rosuvastatin has been routinely filled.  -Continue current medications, recheck LDL.   Diabetes (A1c goal <7%) -Controlled -Current medications: Metformin 250 mg 1/2 tablet twice daily  -Medications previously tried: NA  -Current home glucose readings Random: 103  -Denies hypoglycemic/hyperglycemic symptoms -Recommended to continue current medication  History of Seizures (Goal: Prevent seizures) -Controlled -Managed by Dr. CApril Manson -Current treatment  Lacosamide 150 mg twice daily  Oxcarbazepine 150 mg twice daily -Medications previously tried: NA  -Recommended to continue current medication  GERD (Goal: Minimize symptoms) -Controlled -History of diaphragmatic hernia  -Current treatment  Omeprazole 40 mg twice daily  -Medications previously tried: NA  -Recommended to continue current medication  AJunius Argyle PharmD, BPara March CPP Clinical Pharmacist Practitioner  [facility] [phone]

## 2022-04-16 ENCOUNTER — Emergency Department (HOSPITAL_COMMUNITY)
Admission: EM | Admit: 2022-04-16 | Discharge: 2022-04-16 | Disposition: A | Discharge status: Home / Self Care | Payer: No Typology Code available for payment source | Attending: Emergency Medicine | Admitting: Emergency Medicine

## 2022-04-16 ENCOUNTER — Emergency Department (HOSPITAL_BASED_OUTPATIENT_CLINIC_OR_DEPARTMENT_OTHER): Payer: No Typology Code available for payment source

## 2022-04-16 ENCOUNTER — Encounter (HOSPITAL_COMMUNITY): Payer: Self-pay | Admitting: Emergency Medicine

## 2022-04-16 ENCOUNTER — Other Ambulatory Visit: Payer: Self-pay

## 2022-04-16 DIAGNOSIS — M7989 Other specified soft tissue disorders: Secondary | ICD-10-CM | POA: Diagnosis not present

## 2022-04-16 DIAGNOSIS — M79604 Pain in right leg: Secondary | ICD-10-CM

## 2022-04-16 DIAGNOSIS — R52 Pain, unspecified: Secondary | ICD-10-CM

## 2022-04-16 DIAGNOSIS — M79661 Pain in right lower leg: Secondary | ICD-10-CM | POA: Insufficient documentation

## 2022-04-16 NOTE — ED Provider Notes (Signed)
[facility] Provider Note   CSN: [id number] Arrival date & time: 04/16/22  1433     History  Chief Complaint  Patient presents with   Leg Pain    Sabrina Guerrero is a 77 y.o. female.  Pt complains of intermittent right lower leg pain x 1 week, located in the calf, not on thinners.       Home Medications Prior to Admission medications   Medication Sig Start Date End Date Taking? Authorizing Provider  blood glucose meter kit and supplies KIT Dispense based on patient and insurance preference. Check glucose daily, E11.65 04/20/21   Nche, Charlene Brooke, NP  glucose blood (ACCU-CHEK GUIDE) test strip TEST DAILY 08/03/21   Nche, Charlene Brooke, NP  Lacosamide 150 MG TABS Take 1 tablet (150 mg total) by mouth 2 (two) times daily. 03/01/22   Suzzanne Cloud, NP  metFORMIN (GLUCOPHAGE) 500 MG tablet Take 0.5 tablets (250 mg total) by mouth 2 (two) times daily with a meal. 10/12/21   Nche, Charlene Brooke, NP  omeprazole (PRILOSEC) 40 MG capsule Take 1 capsule (40 mg total) by mouth 2 (two) times daily. 10/12/21   Nche, Charlene Brooke, NP  OXcarbazepine (TRILEPTAL) 150 MG tablet Take 1 tablet (150 mg total) by mouth 2 (two) times daily. 03/01/22   Suzzanne Cloud, NP  rosuvastatin (CRESTOR) 20 MG tablet Take 1 tablet (20 mg total) by mouth daily. 04/09/22   Flossie Buffy, NP  thiamine 100 MG tablet Take 1 tablet by mouth every Monday, Wednesday, and Friday. 01/03/21   [provider]      Allergies    Patient has no known allergies.    Review of Systems   Review of Systems Negative except as per HPI Physical Exam Updated Vital Signs BP (!) 141/75   Pulse 65   Temp 98.3 F (36.8 C) (Oral)   Resp 17   SpO2 100%  Physical Exam Vitals and nursing note reviewed.  Constitutional:      General: She is not in acute distress.    Appearance: She is well-developed. She is not diaphoretic.  HENT:     Head: Normocephalic and atraumatic.   Cardiovascular:     Pulses: Normal pulses.  Pulmonary:     Effort: Pulmonary effort is normal.  Musculoskeletal:        General: No swelling, tenderness, deformity or signs of injury. Normal range of motion.     Right lower leg: No edema.     Left lower leg: No edema.  Skin:    General: Skin is warm and dry.     Coloration: Skin is not pale.     Findings: No erythema or rash.  Neurological:     Mental Status: She is alert and oriented to person, place, and time.     Sensory: No sensory deficit.     Motor: No weakness.     Gait: Gait normal.  Psychiatric:        Behavior: Behavior normal.     ED Results / Procedures / Treatments   Labs (all labs ordered are listed, but only abnormal results are displayed) Labs Reviewed - No data to display  EKG None  Radiology VAS [place] LOWER EXTREMITY VENOUS (DVT) (7a-7p)  Result Date: 04/16/2022  Lower Venous DVT Study Patient Name:  Sabrina Guerrero  Date of Exam:   04/16/2022 Medical Rec #: [id number]        Accession #:  [id number] Date of Birth: July 27, 1945         Patient Gender: F Patient Age:   49 years Exam [facility]  [facility] Procedure:      VAS [place] LOWER EXTREMITY VENOUS (DVT) Referring Phys: Mickel Baas Adaliz Dobis --------------------------------------------------------------------------------  Indications: Pain and swelling in calf.  Comparison Study: No prior study. Performing Technologist: McKayla Maag RVT, VT  Examination Guidelines: A complete evaluation includes B-mode imaging, spectral Doppler, color Doppler, and power Doppler as needed of all accessible portions of each vessel. Bilateral testing is considered an integral part of a complete examination. Limited examinations for reoccurring indications may be performed as noted. The reflux portion of the exam is performed with the patient in reverse Trendelenburg.  +---------+---------------+---------+-----------+----------+--------------+ RIGHT     CompressibilityPhasicitySpontaneityPropertiesThrombus Aging +---------+---------------+---------+-----------+----------+--------------+ CFV      Full           Yes      Yes                                 +---------+---------------+---------+-----------+----------+--------------+ SFJ      Full                                                        +---------+---------------+---------+-----------+----------+--------------+ FV Prox  Full                                                        +---------+---------------+---------+-----------+----------+--------------+ FV Mid   Full                                                        +---------+---------------+---------+-----------+----------+--------------+ FV DistalFull                                                        +---------+---------------+---------+-----------+----------+--------------+ PFV      Full                                                        +---------+---------------+---------+-----------+----------+--------------+ POP      Full           Yes      Yes                                 +---------+---------------+---------+-----------+----------+--------------+ PTV      Full                                                        +---------+---------------+---------+-----------+----------+--------------+  PERO     Full                                                        +---------+---------------+---------+-----------+----------+--------------+   +----+---------------+---------+-----------+----------+--------------+ LEFTCompressibilityPhasicitySpontaneityPropertiesThrombus Aging +----+---------------+---------+-----------+----------+--------------+ CFV Full           Yes      Yes                                 +----+---------------+---------+-----------+----------+--------------+ SFJ Full                                                         +----+---------------+---------+-----------+----------+--------------+     Summary: RIGHT: - There is no evidence of deep vein thrombosis in the lower extremity.  - No cystic structure found in the popliteal fossa. - Ultrasound characteristics of enlarged lymph nodes are noted in the groin.  LEFT: - No evidence of common femoral vein obstruction.  *See table(s) above for measurements and observations.    Preliminary     Procedures Procedures    Medications Ordered in ED Medications - No data to display  ED Course/ Medical Decision Making/ A&P                             Medical Decision Making  77 year old female presents with concern for intermittent right calf pain for the past week.  No history of DVT, not on thinners. On exam, DP pulse present, sensation intact, skin normal, calf soft, nontender, no palpable cords.  Right lower extremity vascular study negative for DVT.  Advised patient to consider using compression hose, follow-up with her PCP for recheck.        Final Clinical Impression(s) / ED Diagnoses Final diagnoses:  Right leg pain    Rx / DC Orders ED Discharge Orders     None         Tacy Learn, PA-C 04/16/22 1747    Sherwood Gambler, MD 04/19/22 [phone]

## 2022-04-16 NOTE — ED Notes (Signed)
Pt A&OX4 ambulatory with independent steady gait. Pt verbalized understanding of d/c instructions and follow up care.

## 2022-04-16 NOTE — Progress Notes (Signed)
Right lower extremity venous study completed.   Preliminary results relayed to Suella Broad, PA-C.  Please see CV Procedures for preliminary results.  Shereen Marton, RVT  4:56 PM 04/16/22

## 2022-04-16 NOTE — Discharge Instructions (Signed)
Try compression hose to see if this helps with your discomfort. Recheck with your primary care provider.

## 2022-04-16 NOTE — ED Provider Triage Note (Signed)
Emergency Medicine Provider Triage Evaluation Note  DELIGHT ZEGER , a 77 y.o. female  was evaluated in triage.  Pt complains of intermittent right lower leg pain x 1 week, located in the calf, not on thinners.  Review of Systems  Positive:  Negative:   Physical Exam  There were no vitals taken for this visit. Gen:   Awake, no distress   Resp:  Normal effort  MSK:   Moves extremities without difficulty  Other:  Right lower leg soft, nontender, no palpable cords, no erythema, no swelling.  Gait intact  Medical Decision Making  Medically screening exam initiated at 3:44 PM.  Appropriate orders placed.  [name] was informed that the remainder of the evaluation will be completed by another provider, this initial triage assessment does not replace that evaluation, and the importance of remaining in the ED until their evaluation is complete.     Tacy Learn, PA-C 04/16/22 1546

## 2022-04-16 NOTE — ED Triage Notes (Signed)
Pt  here from home with c/o right lowe leg / calf pain ,times 1 week

## 2022-04-19 ENCOUNTER — Telehealth: Payer: Self-pay

## 2022-04-19 ENCOUNTER — Other Ambulatory Visit: Payer: Self-pay | Admitting: Nurse Practitioner

## 2022-04-19 DIAGNOSIS — K219 Gastro-esophageal reflux disease without esophagitis: Secondary | ICD-10-CM

## 2022-04-19 NOTE — Transitions of Care (Post Inpatient/ED Visit) (Signed)
   04/19/2022  Name: Sabrina Guerrero MRN: [id number] DOB: 1945/05/28  Today's TOC FU Call Status: Today's TOC FU Call Status:: Unsuccessul Call (1st Attempt) Unsuccessful Call (1st Attempt) Date: 04/19/22  Attempted to reach the patient regarding the most recent Inpatient/ED visit.  Follow Up Plan: Additional outreach attempts will be made to reach the patient to complete the Transitions of Care (Post Inpatient/ED visit) call.   Johnney Killian, RN, BSN, CCM Care Management Coordinator [place] Phone: [phone]: [phone]

## 2022-04-20 ENCOUNTER — Telehealth: Payer: Self-pay

## 2022-04-20 NOTE — Transitions of Care (Post Inpatient/ED Visit) (Signed)
   04/20/2022  Name: Sabrina Guerrero MRN: [id number] DOB: 25-Jul-1945  Today's TOC FU Call Status: Today's TOC FU Call Status:: Unsuccessful Call (2nd Attempt) Unsuccessful Call (2nd Attempt) Date: 04/20/22  Attempted to reach the patient regarding the most recent Inpatient/ED visit.  Follow Up Plan: Additional outreach attempts will be made to reach the patient to complete the Transitions of Care (Post Inpatient/ED visit) call.   Johnney Killian, RN, BSN, CCM Care Management Coordinator [place] Phone: [phone]: [phone]

## 2022-04-22 ENCOUNTER — Other Ambulatory Visit: Payer: Self-pay | Admitting: Nurse Practitioner

## 2022-04-23 ENCOUNTER — Ambulatory Visit: Payer: Self-pay

## 2022-04-23 ENCOUNTER — Telehealth: Payer: Self-pay

## 2022-04-23 NOTE — Transitions of Care (Post Inpatient/ED Visit) (Signed)
   04/23/2022  Name: JAQUELINA ACORN MRN: [id number] DOB: September 12, 1945  Today's TOC FU Call Status: Today's TOC FU Call Status:: Unsuccessful Call (3rd Attempt) Unsuccessful Call (3rd Attempt) Date: 04/23/22  Attempted to reach the patient regarding the most recent Inpatient/ED visit.  Follow Up Plan: No further outreach attempts will be made at this time. We have been unable to contact the patient.  Johnney Killian, RN, BSN, CCM Care Management Coordinator [place] Phone: [phone]: [phone]

## 2022-04-23 NOTE — Chronic Care Management (AMB) (Signed)
   04/23/2022  Sabrina Guerrero 10-22-1945 [id number]  Reason for Encounter: Change in CCM enrollment status  Horris Latino RN Care Manager/Chronic Care Management [phone]

## 2022-05-05 ENCOUNTER — Other Ambulatory Visit: Payer: Self-pay | Admitting: Nurse Practitioner

## 2022-05-05 DIAGNOSIS — E1169 Type 2 diabetes mellitus with other specified complication: Secondary | ICD-10-CM

## 2022-05-10 ENCOUNTER — Telehealth: Payer: Self-pay

## 2022-05-10 NOTE — Progress Notes (Unsigned)
Care Management & Coordination Services Pharmacy Team  Reason for Encounter: Medication coordination and delivery  Contacted patient to discuss medications and coordinate [facility].  Spoke with  patient daughter  on 05/11/2022   Cycle dispensing form sent to Julious Payer, [facility] for review.   Last adherence delivery date:04/23/2022      Patient is due for next adherence delivery on: 05/22/2022  This delivery to include: Vials  30 Days  Omeprazole 40 mg - 1 capsule 2 times daily - Breakfast, Bedtime  Lacosamide 150 mg - 1 tablet 2 times daily - Before Breakfast, Evening meals   Patient declined the following medications this month: Metformin 500 mg -  0.5 tablets 2 times daily - Breakfast, Bedtime -Adequate supply, last filled 04/13/2022 [facility]. Oxacrbazepine 150 mg- 1 tablet 2 times daily - Before Breakfast, Evening meals-Adequate supply, last filled 04/13/2022 [facility]. Rosuvastatin 20 mg-  1 tablet daily - Breakfast- Adequate supply, last filled 04/09/2022 [facility]. Vitamin B1 100 mg- 1 tablet daily - Bedtime- Adequate supply  Patient daughter states patient would like to receive all her medications from [facility], and in packs as before.  I [facility] to [facility] to request a profile  transfer to go to [facility] on 05/14/2022.Per [facility], they will fax it over by the end of the day.  No refill request needed.  Confirmed delivery date of 05/22/2022 (First route), advised patient that pharmacy will contact them the morning of delivery.   Any concerns about your medications? No  How often do you forget or accidentally miss a dose? Never  Do you use a pillbox? No  Is patient in packaging No  If yes  What is the date on your next pill pack?  Any concerns or issues with your packaging?   Recent blood pressure readings are as follows:None ID  Recent  blood glucose readings are as follows:None ID   Chart review: Recent office visits:  None ID  Recent consult visits:  None ID  Hospital visits:  Medication Reconciliation was completed by comparing discharge summary, patient's EMR and Pharmacy list, and upon discussion with patient.  Admitted to the hospital on 04/16/2022 due to Right Leg pain. Discharge date was 04/16/2022. Discharged from [facility].    New?Medications Started at [facility] Discharge:?? -started None ID  Medication Changes at Hospital Discharge: -Changed None ID  Medications Discontinued at Hospital Discharge: -Stopped None ID   Medications that remain the same after Hospital Discharge:??  -All other medications will remain the same.    Medications: Outpatient Encounter Medications as of 05/10/2022  Medication Sig   ACCU-CHEK GUIDE test strip USE TO TEST DAILY   blood glucose meter kit and supplies KIT Dispense based on patient and insurance preference. Check glucose daily, E11.65   Lacosamide 150 MG TABS Take 1 tablet (150 mg total) by mouth 2 (two) times daily.   metFORMIN (GLUCOPHAGE) 500 MG tablet Take 0.5 tablets (250 mg total) by mouth 2 (two) times daily with a meal.   omeprazole (PRILOSEC) 40 MG capsule TAKE ONE CAPSULE BY MOUTH AT BREAKFAST AND AT BEDTIME   OXcarbazepine (TRILEPTAL) 150 MG tablet Take 1 tablet (150 mg total) by mouth 2 (two) times daily.   rosuvastatin (CRESTOR) 20 MG tablet TAKE 1 TABLET BY MOUTH EVERY DAY   thiamine 100 MG tablet Take 1 tablet by mouth every Monday, Wednesday, and Friday.   No  facility-administered encounter medications on file as of 05/10/2022.   BP Readings from Last 3 Encounters:  04/16/22 (!) 141/75  03/01/22 95/60  09/24/21 (!) 149/83    Pulse Readings from Last 3 Encounters:  04/16/22 65  03/01/22 76  09/24/21 77    Lab Results  Component Value Date/Time   HGBA1C 6.5 (A) 09/04/2021 01:41 PM   HGBA1C 6.8 (H) 04/20/2021 11:09 AM   HGBA1C 6.5  11/18/2012 10:53 AM   Lab Results  Component Value Date   CREATININE 0.76 03/01/2022   BUN 8 03/01/2022   GFR 83.36 04/20/2021   GFRNONAA >60 09/24/2021   GFRAA 81 04/01/2019   NA 132 (L) 03/01/2022   K 4.2 03/01/2022   CALCIUM 9.7 03/01/2022   CO2 23 03/01/2022     Bessie Kellihan,CPA Clinical Pharmacist Assistant [phone]

## 2022-06-05 ENCOUNTER — Other Ambulatory Visit: Payer: Self-pay | Admitting: Neurology

## 2022-06-05 NOTE — Telephone Encounter (Signed)
Requested Prescriptions   Pending Prescriptions Disp Refills   Lacosamide 150 MG TABS [Pharmacy Med Name: lacosamide 150 mg tablet] 180 tablet 1    Sig: TAKE ONE TABLET BY MOUTH BEFORE BREAKFAST and TAKE ONE TABLET BY MOUTH EVERY EVENING   Last visit1/25/24 w/slack np, next visit 09/18/22 Dispenses   Dispensed Days Supply Quantity Provider Pharmacy  lacosamide 150 mg tablet 05/16/2022 30 60 each Windell Norfolk, MD [facility]  lacosamide 150 mg tablet 04/19/2022 30 60 each Windell Norfolk, MD [facility]  lacosamide 150 mg tablet 03/22/2022 30 60 each Windell Norfolk, MD [facility]  lacosamide 150 mg tablet 02/21/2022 30 60 each Windell Norfolk, MD [facility]...  lacosamide 150 mg tablet 01/24/2022 30 60 each Windell Norfolk, MD [facility]...  lacosamide 150 mg tablet 12/20/2021 30 60 each Windell Norfolk, MD [facility]...  LACOSAMIDE 150 MG TABLET 09/25/2021 90 180 each Windell Norfolk, MD [facility] [phone] - [facility]...  LACOSAMIDE 150 MG TABLET 06/22/2021 90 180 each Glean Salvo, NP [facility] [phone] - G.Marland KitchenMarland Kitchen

## 2022-06-11 ENCOUNTER — Telehealth: Payer: Self-pay

## 2022-06-11 NOTE — Progress Notes (Signed)
Care Management & Coordination Services Pharmacy Team  Reason for Encounter: Medication coordination and delivery  Contacted patient to discuss medications and coordinate delivery from [facility]. Spoke with  Patient daughter  on 06/11/2022   Cycle dispensing form sent to Julious Payer, [facility] for review.   Last adherence delivery date:05/24/2022      Patient is due for next adherence delivery on: 06/21/2022  This delivery to include: Vials  30 Days  Omeprazole 40 mg - 1 capsule 2 times daily - Breakfast, Bedtime  Lacosamide 150 mg - 1 tablet 2 times daily - Before Breakfast, Evening meals   Patient declined the following medications this month: Metformin 500 mg -  0.5 tablets 2 times daily - Breakfast, Bedtime -Adequate supply, last filled 04/13/2022 for [facility] day supply at [facility]. Oxacrbazepine 150 mg- 1 tablet 2 times daily - Before Breakfast, Evening meals-Adequate supply, last filled 04/13/2022 for 90 day supply at [facility]. Rosuvastatin 20 mg-  1 tablet daily - Breakfast- Adequate supply, last filled 05/07/2022 for [facility] day supply at [facility]. Vitamin B1 100 mg- 1 tablet daily - Bedtime- Adequate supply  No refill request needed.  Confirmed delivery date of 06/21/2022 (second route), advised patient that pharmacy will contact them the morning of delivery.   Any concerns about your medications? No  How often do you forget or accidentally miss a dose? Never  Do you use a pillbox? No  Is patient in packaging No   Recent blood pressure readings are as follows:None ID  Recent blood glucose readings are as follows:None ID  Care Gaps: Dtap Vaccine Shingrix vaccine Pneumonia Vaccine COVID-19 Vaccine Hemoglobin A1C  Star Ratings drugs: Metformin 500 mg last filled 04/13/2022 90 Day [facility]. Rosuvastatin 20 mg last filled 05/07/2022 90 Day supply at [facility].  Chart review: Recent office visits:  None ID  Recent consult visits:   None ID  Hospital visits:  None in previous 6 months  Medications: Outpatient Encounter Medications as of 06/11/2022  Medication Sig   ACCU-CHEK GUIDE test strip USE TO TEST DAILY   blood glucose meter kit and supplies KIT Dispense based on patient and insurance preference. Check glucose daily, E11.65   Lacosamide 150 MG TABS TAKE ONE TABLET BY MOUTH BEFORE BREAKFAST and TAKE ONE TABLET BY MOUTH EVERY EVENING   metFORMIN (GLUCOPHAGE) 500 MG tablet Take 0.5 tablets (250 mg total) by mouth 2 (two) times daily with a meal.   omeprazole (PRILOSEC) 40 MG capsule TAKE ONE CAPSULE BY MOUTH AT BREAKFAST AND AT BEDTIME   OXcarbazepine (TRILEPTAL) 150 MG tablet Take 1 tablet (150 mg total) by mouth 2 (two) times daily.   rosuvastatin (CRESTOR) 20 MG tablet TAKE 1 TABLET BY MOUTH EVERY DAY   thiamine 100 MG tablet Take 1 tablet by mouth every Monday, Wednesday, and Friday.   No facility-administered encounter medications on file as of 06/11/2022.   BP Readings from Last 3 Encounters:  04/16/22 (!) 141/75  03/01/22 95/60  09/24/21 (!) 149/83    Pulse Readings from Last 3 Encounters:  04/16/22 65  03/01/22 76  09/24/21 77    Lab Results  Component Value Date/Time   HGBA1C 6.5 (A) 09/04/2021 01:41 PM   HGBA1C 6.8 (H) 04/20/2021 11:09 AM   HGBA1C 6.5 11/18/2012 10:53 AM   Lab Results  Component Value Date   CREATININE 0.76 03/01/2022   BUN 8 03/01/2022   GFR 83.36 04/20/2021   GFRNONAA >60 09/24/2021   GFRAA 81 04/01/2019   NA 132 (L)  03/01/2022   K 4.2 03/01/2022   CALCIUM 9.7 03/01/2022   CO2 23 03/01/2022     Everlean Cherry Clinical Pharmacist Assistant [phone]

## 2022-06-12 ENCOUNTER — Ambulatory Visit (INDEPENDENT_AMBULATORY_CARE_PROVIDER_SITE_OTHER): Payer: 59 | Admitting: Nurse Practitioner

## 2022-06-12 ENCOUNTER — Encounter: Payer: Self-pay | Admitting: Nurse Practitioner

## 2022-06-12 VITALS — BP 110/66 | HR 64 | Temp 98.1°F | Resp 16 | Ht 62.0 in | Wt 136.8 lb

## 2022-06-12 DIAGNOSIS — E871 Hypo-osmolality and hyponatremia: Secondary | ICD-10-CM | POA: Diagnosis not present

## 2022-06-12 DIAGNOSIS — E1169 Type 2 diabetes mellitus with other specified complication: Secondary | ICD-10-CM | POA: Diagnosis not present

## 2022-06-12 DIAGNOSIS — R634 Abnormal weight loss: Secondary | ICD-10-CM

## 2022-06-12 DIAGNOSIS — E785 Hyperlipidemia, unspecified: Secondary | ICD-10-CM

## 2022-06-12 DIAGNOSIS — E1165 Type 2 diabetes mellitus with hyperglycemia: Secondary | ICD-10-CM

## 2022-06-12 DIAGNOSIS — Z0001 Encounter for general adult medical examination with abnormal findings: Secondary | ICD-10-CM

## 2022-06-12 LAB — CBC
HCT: 37.3 % (ref 36.0–46.0)
Hemoglobin: 12.4 g/dL (ref 12.0–15.0)
MCHC: 33.2 g/dL (ref 30.0–36.0)
MCV: 97.3 fl (ref 78.0–100.0)
Platelets: 281 10*3/uL (ref 150.0–400.0)
RBC: 3.84 Mil/uL — ABNORMAL LOW (ref 3.87–5.11)
RDW: 12.7 % (ref 11.5–15.5)
WBC: 4.3 10*3/uL (ref 4.0–10.5)

## 2022-06-12 LAB — RENAL FUNCTION PANEL
Albumin: 3.8 g/dL (ref 3.5–5.2)
BUN: 7 mg/dL (ref 6–23)
CO2: 26 mEq/L (ref 19–32)
Calcium: 9.3 mg/dL (ref 8.4–10.5)
Chloride: 95 mEq/L — ABNORMAL LOW (ref 96–112)
Creatinine, Ser: 0.61 mg/dL (ref 0.40–1.20)
GFR: 86.95 mL/min (ref >60.00)
Glucose, Bld: 104 mg/dL — ABNORMAL HIGH (ref 70–99)
Phosphorus: 3.8 mg/dL (ref 2.3–4.6)
Potassium: 4.3 mEq/L (ref 3.5–5.1)
Sodium: 129 mEq/L — ABNORMAL LOW (ref 135–145)

## 2022-06-12 LAB — TSH: TSH: 0.51 u[IU]/mL (ref 0.35–5.50)

## 2022-06-12 LAB — HEMOGLOBIN A1C: Hgb A1c MFr Bld: 6.1 % (ref 4.6–6.5)

## 2022-06-12 LAB — LDL CHOLESTEROL, DIRECT: Direct LDL: 141 mg/dL

## 2022-06-12 NOTE — Patient Instructions (Signed)
Go to lab Complete puzzles daily Complete chair exercises daily Eat at least 2 balanced diet daily Ok to drink glucerna as meal substitute if needed.  Sit-to-Stand Exercise  The sit-to-stand exercise (also known as the chair stand or chair rise exercise) strengthens your lower body and helps you maintain or improve your mobility and independence. The end goal is to do the sit-to-stand exercise without using your hands. This will be easier as you become stronger. You should always talk with your health care provider before starting any exercise program, especially if you have had recent surgery. Do the exercise exactly as told by your health care provider and adjust it as directed. It is normal to feel mild stretching, pulling, tightness, or discomfort as you do this exercise, but you should stop right away if you feel sudden pain or your pain gets worse. Do not begin doing this exercise until told by your health care provider. What the sit-to-stand exercise does The sit-to-stand exercise helps to strengthen the muscles in your thighs and the muscles in the center of your body that give you stability (core muscles). This exercise is especially helpful if: You have had knee or hip surgery. You have trouble getting up from a chair, out of a car, or off the toilet due to muscle weakness. How to do the sit-to-stand exercise Sit toward the front edge of a sturdy chair without armrests. Your knees should be bent and your feet should be flat on the floor and shoulder-width apart and underneath your hips. Place your hands lightly on each side of the seat. Keep your back and neck as straight as possible, with your chest slightly forward. Breathe in slowly. Lean forward and slightly shift your weight to the front of your feet. Breathe out as you slowly stand up. Try not to support any weight with your hands. Stand and pause for a full breath in and out. Breathe in as you sit down slowly. Tighten your core  and abdominal muscles to control your lowering as much as possible. You should lower yourself back to the chair slowly, not just drop back into the seat. Breathe out slowly. Do this exercise 10-15 times. If needed, do it fewer times until you build up strength. Rest for 1 minute, then do another set of 10-15 repetitions. To change the difficulty of the sit-to-stand exercise If the exercise is too difficult, use a chair with sturdy armrests, and push off the armrests to help you come to the standing position. You can also use the armrests to help slowly lower yourself back to sitting. As this gets easier, try to use your arms less. You can also place a firm cushion or pillow on the chair to make the surface higher. If this exercise is too easy, do not use your arms to help raise or lower yourself. You can also wear a weighted vest, use hand weights, increase your repetitions, or try a lower chair. General tips You may feel tired when starting an exercise routine. This is normal. You may have muscle soreness that lasts a few days. This is normal. As you get stronger, you may not feel muscle soreness. Use smooth, steady movements. Do not  hold your breath during strength exercises. This can cause unsafe changes in your blood pressure. Breathe in slowly through your nose, and breathe out slowly through your mouth. Summary Strengthening your lower body is an important step to help you move safely and independently. The sit-to-stand exercise helps strengthen the muscles  in your thighs and core. You should always talk with your health care provider before starting any exercise program, especially if you have had recent surgery. This information is not intended to replace advice given to you by your health care provider. Make sure you discuss any questions you have with your health care provider. Document Revised: 05/15/2020 Document Reviewed: 05/15/2020 Elsevier Patient Education  2023 [employer].

## 2022-06-12 NOTE — Progress Notes (Unsigned)
Complete physical exam  Patient: Sabrina Guerrero   DOB: 22-Aug-1945   77 y.o. Female  MRN: [id number] Visit Date: 06/13/2022  Subjective:    Chief Complaint  Patient presents with   Annual Exam    Not Fasting- Had Cornbread and milk    Accompanied by daughter.  [name] is a 77 y.o. female who presents today for a complete physical exam. She reports consuming a low fat and low protein diet.  Walking daily  She generally feels well. She reports sleeping well. She does have additional problems to discuss today.  Vision:No Dental:No  BP Readings from Last 3 Encounters:  06/12/22 110/66  04/16/22 (!) 141/75  03/01/22 95/60   Wt Readings from Last 3 Encounters:  06/12/22 136 lb 12.8 oz (62.1 kg)  03/01/22 180 lb (81.6 kg)  09/24/21 180 lb (81.6 kg)   Most recent fall risk assessment:    09/04/2021    1:12 PM  Fall Risk   Falls in the past year? 0  Number falls in past yr: 0  Injury with Fall? 0   Depression screen:Yes - No Depression  Most recent depression screenings:    06/12/2022    9:08 AM 07/11/2021   10:56 AM  PHQ 2/9 Scores  PHQ - 2 Score 0 0   HPI  She reports unsteady gait but no falls in last 6months. Advised to perform chair exercise daily  Weight loss, abnormal She intentionally limited caloric intake so she does not have to take metfomin. She has been eating mostly fruits and daily. She has lost about 50lbs in last 6months Wt Readings from Last 3 Encounters:  06/12/22 136 lb 12.8 oz (62.1 kg)  03/01/22 180 lb (81.6 kg)  09/24/21 180 lb (81.6 kg)    Advised to eat at least daily and use glucerna as meal substitue. Daughter plans to assist with grocery Check prealbumin, TSH: normal F/up in 53month  Type 2 diabetes mellitus with hyperglycemia, without long-term current use of insulin (HCC) Repeat hgbA1c: 6.1% controlled BP at goal LDL not at goal:continue crestor. 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, requiring surgery   Memory deficit 05/20/2014   Partial symptomatic epilepsy with complex partial seizures, not intractable, without status epilepticus (HCC) 05/20/2014   most recent 09/10/20   Ventral hernia    Past Surgical History:  Procedure Laterality Date   BRAIN SURGERY  02/03/2020   COLONOSCOPY WITH PROPOFOL N/A 12/08/2020   Procedure: COLONOSCOPY WITH PROPOFOL;  Surgeon: Wyline Mood, MD;  Location: [facility] ENDOSCOPY;  Service: Gastroenterology;  Laterality: N/A;   ESOPHAGOGASTRODUODENOSCOPY N/A 12/08/2020   Procedure: ESOPHAGOGASTRODUODENOSCOPY (EGD);  Surgeon: Wyline Mood, MD;  Location: [facility] ENDOSCOPY;  Service: Gastroenterology;  Laterality: N/A;   HERNIA REPAIR     SUBDURAL HEMATOMA EVACUATION VIA CRANIOTOMY  01/2020   TUBAL LIGATION     VENTRAL HERNIA REPAIR     Social History   Socioeconomic History   Marital status: Divorced    Spouse name: Not on file   Number of children: 3   Years of education: 18 years   Highest education level: Not on file  Occupational  History   Occupation: unemployed  Tobacco Use   Smoking status: Never   Smokeless tobacco: Never  Vaping Use   Vaping Use: Never used  Substance and Sexual Activity   Alcohol use: Never   Drug use: Never   Sexual activity: Never  Other Topics Concern   Not on file  Social History Narrative   10/06/20 lives with dgtr   Patient is right handed.   Patient does not drink caffeine.   Social Determinants of Health   Financial Resource Strain: Low Risk  (10/12/2021)   Overall Financial Resource Strain (CARDIA)    Difficulty of Paying Living Expenses: Not hard at all   Food Insecurity: No Food Insecurity (07/11/2021)   Hunger Vital Sign    Worried About Running Out of Food in the Last Year: Never true    Ran Out of Food in the Last Year: Never true  Transportation Needs: No Transportation Needs (10/12/2021)   PRAPARE - Administrator, Civil Service (Medical): No    Lack of Transportation (Non-Medical): No  Physical Activity: Sufficiently Active (07/11/2021)   Exercise Vital Sign    Days of Exercise per Week: 5 days    Minutes of Exercise per Session: 30 min  Stress: No Stress Concern Present (07/11/2021)   [employer] of Occupational Health - Occupational Stress Questionnaire    Feeling of Stress : Not at all  Social Connections: Moderately Isolated (07/11/2021)   Social Connection and Isolation Panel [NHANES]    Frequency of Communication with Friends and Family: Three times a week    Frequency of Social Gatherings with Friends and Family: Three times a week    Attends Religious Services: More than 4 times per year    Active Member of Clubs or Organizations: No    Attends Banker Meetings: Never    Marital Status: Divorced  Catering manager Violence: Not At Risk (07/11/2021)   Humiliation, Afraid, Rape, and Kick questionnaire    Fear of Current or Ex-Partner: No    Emotionally Abused: No    Physically Abused: No    Sexually Abused: No   Family Status  Relation Name Status   Mother LOUSI TODD Alive   Brother BOBBY TODD Deceased   Father  Deceased at age 17       61   Sister  Alive   Brother  Armed forces training and education officer   Daughter  Alive   Son  Alive   Sister  Alive   Sister  Alive   Daughter  Alive   Neg Hx  (Not Specified)   Family History  Problem Relation Age of Onset   Arthritis Mother    Cancer Brother        lung   Colon cancer Neg Hx    Esophageal cancer Neg Hx    Stomach cancer Neg Hx    Liver disease Neg Hx    No Known Allergies  Patient Care Team: Lyvia Mondesir, Bonna Gains, NP as PCP - General (Internal Medicine) Gaspar Cola, [facility] (Pharmacist)   Medications: Outpatient Medications Prior to Visit  Medication Sig   ACCU-CHEK GUIDE test strip USE TO TEST DAILY   blood glucose meter kit and supplies KIT Dispense based on patient and insurance preference. Check glucose daily, E11.65   Lacosamide 150 MG TABS TAKE ONE TABLET BY MOUTH BEFORE BREAKFAST and TAKE ONE TABLET BY MOUTH EVERY EVENING   metFORMIN (GLUCOPHAGE) 500 MG tablet Take 0.5 tablets (250 mg total) by mouth 2 (two)  times daily with a meal.   omeprazole (PRILOSEC) 40 MG capsule TAKE ONE CAPSULE BY MOUTH AT BREAKFAST AND AT BEDTIME   OXcarbazepine (TRILEPTAL) 150 MG tablet Take 1 tablet (150 mg total) by mouth 2 (two) times daily.   rosuvastatin (CRESTOR) 20 MG tablet TAKE 1 TABLET BY MOUTH EVERY DAY   thiamine 100 MG tablet Take 1 tablet by mouth every Monday, Wednesday, and Friday.   No facility-administered medications prior to visit.   Review of Systems  Constitutional:  Positive for unexpected weight change. Negative for activity change, appetite change, chills, diaphoresis and fatigue.  Respiratory: Negative.    Cardiovascular: Negative.   Gastrointestinal: Negative.   Endocrine: Negative for cold intolerance and heat intolerance.  Genitourinary: Negative.   Musculoskeletal: Negative.   Skin: Negative.   Neurological: Negative.   Hematological: Negative.   Psychiatric/Behavioral:  Negative for behavioral problems, decreased concentration, dysphoric mood, hallucinations, self-injury, sleep disturbance and suicidal ideas. The patient is not nervous/anxious.        Objective:  BP 110/66 (BP Location: Right Arm, Patient Position: Sitting, Cuff Size: Large)   Pulse 64   Temp 98.1 F (36.7 C) (Temporal)   Resp 16   Ht 5\' 2"  (1.575 m)   Wt 136 lb 12.8 oz (62.1 kg)   SpO2 99%   BMI 25.02 kg/m     Physical Exam Vitals and nursing note reviewed.  HENT:     Right Ear: Tympanic membrane, ear canal and external ear normal.      Left Ear: Tympanic membrane, ear canal and external ear normal.  Eyes:     Extraocular Movements: Extraocular movements intact.     Conjunctiva/sclera: Conjunctivae normal.     Pupils: Pupils are equal, round, and reactive to light.  Cardiovascular:     Rate and Rhythm: Normal rate and regular rhythm.     Pulses: Normal pulses.     Heart sounds: Normal heart sounds.  Pulmonary:     Effort: Pulmonary effort is normal.     Breath sounds: Normal breath sounds.  Abdominal:     General: Bowel sounds are normal.     Palpations: Abdomen is soft.  Musculoskeletal:     Right shoulder: Normal.     Left shoulder: Normal.     Right upper arm: Normal.     Left upper arm: Normal.     Right elbow: Normal.     Left elbow: Normal.     Right forearm: Normal.     Left forearm: Normal.     Right wrist: Normal.     Left wrist: Normal.     Right hand: Normal.     Left hand: Normal.     Cervical back: Normal, normal range of motion and neck supple.     Lumbar back: Normal.     Right hip: Normal.     Left hip: Normal.     Right upper leg: Normal.     Left upper leg: Normal.     Right knee: Normal.     Left knee: Normal.     Right lower leg: Normal.     Left lower leg: Normal.     Right ankle: Normal.     Left ankle: Normal.  Lymphadenopathy:     Cervical: No cervical adenopathy.  Neurological:     Mental Status: She is alert and oriented to person, place, and time.  Psychiatric:        Mood and Affect: Mood normal.  Behavior: Behavior normal.        Cognition and Memory: Cognition is impaired. Memory is impaired.        Judgment: Judgment is impulsive.     Results for orders placed or performed in visit on 06/12/22  Osmolality, urine  Result Value Ref Range   Osmolality, Ur 337 50 - 1,200 mOsm/kg  Hemoglobin A1c  Result Value Ref Range   Hgb A1c MFr Bld 6.1 4.6 - 6.5 %  Direct LDL  Result Value Ref Range   Direct LDL 141.0 mg/dL  CBC  Result Value Ref Range   WBC 4.3 4.0  - 10.5 K/uL   RBC 3.84 (L) 3.87 - 5.11 Mil/uL   Platelets 281.0 150.0 - 400.0 K/uL   Hemoglobin 12.4 12.0 - 15.0 g/dL   HCT 16.1 09.6 - 04.5 %   MCV 97.3 78.0 - 100.0 fl   MCHC 33.2 30.0 - 36.0 g/dL   RDW 40.9 81.1 - 91.4 %  Renal Function Panel  Result Value Ref Range   Sodium 129 (L) 135 - 145 mEq/L   Potassium 4.3 3.5 - 5.1 mEq/L   Chloride 95 (L) 96 - 112 mEq/L   CO2 26 19 - 32 mEq/L   Albumin 3.8 3.5 - 5.2 g/dL   BUN 7 6 - 23 mg/dL   Creatinine, Ser 7.82 0.40 - 1.20 mg/dL   Glucose, Bld 956 (H) 70 - 99 mg/dL   Phosphorus 3.8 2.3 - 4.6 mg/dL   GFR 21.30 >86.57 mL/min   Calcium 9.3 8.4 - 10.5 mg/dL  TSH  Result Value Ref Range   TSH 0.51 0.35 - 5.50 uIU/mL  Prealbumin  Result Value Ref Range   Prealbumin 18 17 - 34 mg/dL      Assessment & Plan:    Routine Health Maintenance and Physical Exam  Immunization History  Administered Date(s) Administered   PFIZER(Purple Top)SARS-COV-2 Vaccination 09/01/2019, 09/22/2019    Health Maintenance  Topic Date Due   COVID-19 Vaccine (3 - 2023-24 season) 06/28/2022 (Originally 10/06/2021)   Hepatitis C Screening  09/05/2022 (Originally 03/16/1963)   Zoster Vaccines- Shingrix (1 of 2) 09/12/2022 (Originally 03/16/1996)   Pneumonia Vaccine 16+ Years old (1 of 1 - PCV) 06/12/2023 (Originally 03/17/2011)   FOOT EXAM  08/17/2022   Diabetic kidney evaluation - Urine ACR  09/05/2022   INFLUENZA VACCINE  09/06/2022   HEMOGLOBIN A1C  12/13/2022   OPHTHALMOLOGY EXAM  01/25/2023   Diabetic kidney evaluation - eGFR measurement  06/12/2023   DEXA SCAN  Completed   HPV VACCINES  Aged Out   DTaP/Tdap/Td  Discontinued   COLONOSCOPY (Pts 45-68yrs Insurance coverage will need to be confirmed)  Discontinued   Discussed health benefits of physical activity, and encouraged her to engage in regular exercise appropriate for her age and condition. Advised to engage in memory exercises like reading or completing puzzles word scrabble.  Problem List Items  Addressed This Visit       Endocrine   Hyperlipidemia associated with type 2 diabetes mellitus (HCC)    Repeat LDL since non fasting today Maintain crestor dose      Relevant Orders   Direct LDL (Completed)   Type 2 diabetes mellitus with hyperglycemia, without long-term current use of insulin (HCC)    Repeat hgbA1c: 6.1% controlled BP at goal LDL not at goal:continue crestor. Maintain metformin dose 250mg  BID Consider holding med if persistent weight loss and poor apetite      Relevant Orders   Hemoglobin A1c (Completed)  Other   Weight loss, abnormal    She intentionally limited caloric intake so she does not have to take metfomin. She has been eating mostly fruits and daily. She has lost about 50lbs in last 6months Wt Readings from Last 3 Encounters:  06/12/22 136 lb 12.8 oz (62.1 kg)  03/01/22 180 lb (81.6 kg)  09/24/21 180 lb (81.6 kg)    Advised to eat at least daily and use glucerna as meal substitue. Daughter plans to assist with grocery Check prealbumin, TSH: normal F/up in 50month      Relevant Orders   CBC (Completed)   TSH (Completed)   Prealbumin (Completed)   Other Visit Diagnoses     Encounter for preventative adult health care exam with abnormal findings    -  Primary   Hyponatremia       Relevant Orders   Osmolality, urine (Completed)   Renal Function Panel (Completed)      Return in about 4 weeks (around 07/10/2022) for abnormal weight loss.     Alysia Penna, NP

## 2022-06-13 DIAGNOSIS — R634 Abnormal weight loss: Secondary | ICD-10-CM | POA: Insufficient documentation

## 2022-06-13 LAB — PREALBUMIN: Prealbumin: 18 mg/dL (ref 17–34)

## 2022-06-13 LAB — OSMOLALITY, URINE: Osmolality, Ur: 337 mOsm/kg (ref 50–1200)

## 2022-06-13 NOTE — Assessment & Plan Note (Addendum)
She intentionally limited caloric intake so she does not have to take metfomin. She has been eating mostly fruits and daily. She has lost about 50lbs in last 6months Wt Readings from Last 3 Encounters:  06/12/22 136 lb 12.8 oz (62.1 kg)  03/01/22 180 lb (81.6 kg)  09/24/21 180 lb (81.6 kg)    Advised to eat at least daily and use glucerna as meal substitue. Daughter plans to assist with grocery Check prealbumin, TSH: normal F/up in 79month

## 2022-06-13 NOTE — Assessment & Plan Note (Signed)
Repeat LDL since non fasting today Maintain crestor dose

## 2022-06-13 NOTE — Assessment & Plan Note (Addendum)
Repeat hgbA1c: 6.1% controlled BP at goal LDL not at goal:continue crestor. Maintain metformin dose 250mg  BID Consider holding med if persistent weight loss and poor apetite

## 2022-06-15 MED ORDER — METFORMIN HCL 500 MG PO TABS: 250.0000 mg | ORAL_TABLET | Freq: Every day | ORAL | 1 refills | Status: DC

## 2022-06-15 NOTE — Addendum Note (Signed)
Addended by: Michaela Corner on: 06/15/2022 04:31 PM   Modules accepted: Orders

## 2022-07-08 ENCOUNTER — Other Ambulatory Visit: Payer: Self-pay | Admitting: Nurse Practitioner

## 2022-07-08 DIAGNOSIS — E1165 Type 2 diabetes mellitus with hyperglycemia: Secondary | ICD-10-CM

## 2022-07-10 ENCOUNTER — Telehealth: Payer: Self-pay

## 2022-07-10 NOTE — Progress Notes (Signed)
Care Management & Coordination Services Pharmacy Team  Reason for Encounter: Medication coordination and delivery  Contacted patient to discuss medications and coordinate delivery from [facility].  Spoke with  Patient daughter  on 07/10/2022   Cycle dispensing form sent to Leilani Able, [employer] for review.   Last adherence delivery date:06/21/2022      Patient is due for next adherence delivery on: 07/20/2022  This delivery to include: Adherence Packaging  30 Days  Omeprazole 40 mg - 1 capsule 2 times daily - Breakfast, Bedtime  Lacosamide 150 mg - 1 tablet 2 times daily - Before Breakfast, Evening meals  Oxacrbazepine 150 mg- 1 tablet 2 times daily - Before Breakfast, Evening meals Vitamin B1 100 mg- 1 tablet daily - Bedtime  Patient declined the following medications this month: Metformin 250 mg -  0.5 tablets 2 times daily - Breakfast, Bedtime -Adequate supply, last filled 07/09/2022 for 90 day supply at [facility]. Rosuvastatin 20 mg-  1 tablet daily - Breakfast- Adequate supply, last filled 05/07/2022 for 90 day supply at [facility].   No refill request needed.  Confirmed delivery date of 07/20/2022 ([facility]), advised patient that pharmacy will contact them the morning of delivery.   Any concerns about your medications? No  How often do you forget or accidentally miss a dose? Never  Do you use a pillbox? No  Is patient in packaging Yes,patient is restarting packaging.     Recent blood pressure readings are as follows:None ID  Recent blood glucose readings are as follows:None ID   Care Gaps: COVID-19 Vaccine    [facility] drugs: Metformin 500 mg last filled 07/09/2022 90 Day supply at [facility]. Rosuvastatin 20 mg last filled 05/07/2022 90 Day supply at [facility].  Chart review: Recent office visits:  06/12/2022 Alysia Penna NP (PCP) Decreased metformin to 1/2tab daily with food  ,Lab work completed - A1C- 6.1%, Follow up 1 month  Recent  consult visits:  None ID  Hospital visits:  None in previous 6 months  Medications: Outpatient Encounter Medications as of 07/10/2022  Medication Sig   ACCU-CHEK GUIDE test strip USE TO TEST DAILY   blood glucose meter kit and supplies KIT Dispense based on patient and insurance preference. Check glucose daily, E11.65   Lacosamide 150 MG TABS TAKE ONE TABLET BY MOUTH BEFORE BREAKFAST and TAKE ONE TABLET BY MOUTH EVERY EVENING   metFORMIN (GLUCOPHAGE) 500 MG tablet TAKE 0.5 TABLETS (250 MG TOTAL) BY MOUTH 2 (TWO) TIMES DAILY WITH A MEAL.   omeprazole (PRILOSEC) 40 MG capsule TAKE ONE CAPSULE BY MOUTH AT BREAKFAST AND AT BEDTIME   OXcarbazepine (TRILEPTAL) 150 MG tablet Take 1 tablet (150 mg total) by mouth 2 (two) times daily.   rosuvastatin (CRESTOR) 20 MG tablet TAKE 1 TABLET BY MOUTH EVERY DAY   thiamine 100 MG tablet Take 1 tablet by mouth every Monday, Wednesday, and Friday.   No facility-administered encounter medications on file as of 07/10/2022.   BP Readings from Last 3 Encounters:  06/12/22 110/66  04/16/22 (!) 141/75  03/01/22 95/60    Pulse Readings from Last 3 Encounters:  06/12/22 64  04/16/22 65  03/01/22 76    Lab Results  Component Value Date/Time   HGBA1C 6.1 06/12/2022 10:25 AM   HGBA1C 6.5 (A) 09/04/2021 01:41 PM   HGBA1C 6.8 (H) 04/20/2021 11:09 AM   Lab Results  Component Value Date   CREATININE 0.61 06/12/2022   BUN 7 06/12/2022   GFR 86.95 06/12/2022   GFRNONAA >60  09/24/2021   GFRAA 81 04/01/2019   NA 129 (L) 06/12/2022   K 4.3 06/12/2022   CALCIUM 9.3 06/12/2022   CO2 26 06/12/2022    Everlean Cherry Clinical Pharmacist Assistant [phone]

## 2022-07-13 ENCOUNTER — Ambulatory Visit (INDEPENDENT_AMBULATORY_CARE_PROVIDER_SITE_OTHER): Payer: 59 | Admitting: Nurse Practitioner

## 2022-07-13 ENCOUNTER — Ambulatory Visit (INDEPENDENT_AMBULATORY_CARE_PROVIDER_SITE_OTHER): Payer: 59

## 2022-07-13 ENCOUNTER — Encounter: Payer: Self-pay | Admitting: Nurse Practitioner

## 2022-07-13 VITALS — BP 112/60 | HR 83 | Temp 97.8°F | Ht 61.0 in | Wt 142.4 lb

## 2022-07-13 VITALS — BP 108/60 | HR 75 | Temp 98.2°F | Ht 61.0 in | Wt 141.8 lb

## 2022-07-13 DIAGNOSIS — F441 Dissociative fugue: Secondary | ICD-10-CM | POA: Diagnosis not present

## 2022-07-13 DIAGNOSIS — Z7984 Long term (current) use of oral hypoglycemic drugs: Secondary | ICD-10-CM

## 2022-07-13 DIAGNOSIS — K219 Gastro-esophageal reflux disease without esophagitis: Secondary | ICD-10-CM

## 2022-07-13 DIAGNOSIS — Z0000 Encounter for general adult medical examination without abnormal findings: Secondary | ICD-10-CM | POA: Diagnosis not present

## 2022-07-13 DIAGNOSIS — R634 Abnormal weight loss: Secondary | ICD-10-CM

## 2022-07-13 DIAGNOSIS — D126 Benign neoplasm of colon, unspecified: Secondary | ICD-10-CM | POA: Insufficient documentation

## 2022-07-13 DIAGNOSIS — E1165 Type 2 diabetes mellitus with hyperglycemia: Secondary | ICD-10-CM | POA: Diagnosis not present

## 2022-07-13 NOTE — Progress Notes (Signed)
Subjective:   Sabrina Guerrero is a 77 y.o. female who presents for Medicare Annual (Subsequent) preventive examination.  Review of Systems     Cardiac Risk Factors include: advanced age (>56men, >76 women);diabetes mellitus;dyslipidemia;hypertension     Objective:    Today's Vitals   07/13/22 0948  BP: 112/60  Pulse: 83  Temp: 97.8 F (36.6 C)  TempSrc: Oral  SpO2: 99%  Weight: 142 lb 6.4 oz (64.6 kg)  Height: 5\' 1"  (1.549 m)   Body mass index is 26.91 kg/m.     07/13/2022    9:56 AM 07/11/2021   10:54 AM 04/25/2021   10:30 AM 12/08/2020    8:05 AM 10/27/2020    6:42 PM 04/13/2020    7:07 PM 04/08/2020    1:07 PM  Advanced Directives  Does Patient Have a Medical Advance Directive? No No No No No No No  Would patient like information on creating a medical advance directive? No - Patient declined No - Patient declined No - Patient declined No - Patient declined No - Patient declined  No - Patient declined    Current Medications (verified) Outpatient Encounter Medications as of 07/13/2022  Medication Sig   Lacosamide 150 MG TABS TAKE ONE TABLET BY MOUTH BEFORE BREAKFAST and TAKE ONE TABLET BY MOUTH EVERY EVENING   metFORMIN (GLUCOPHAGE) 500 MG tablet TAKE 0.5 TABLETS (250 MG TOTAL) BY MOUTH 2 (TWO) TIMES DAILY WITH A MEAL.   omeprazole (PRILOSEC) 40 MG capsule TAKE ONE CAPSULE BY MOUTH AT BREAKFAST AND AT BEDTIME   OXcarbazepine (TRILEPTAL) 150 MG tablet Take 1 tablet (150 mg total) by mouth 2 (two) times daily.   rosuvastatin (CRESTOR) 20 MG tablet TAKE 1 TABLET BY MOUTH EVERY DAY   thiamine 100 MG tablet Take 1 tablet by mouth every Monday, Wednesday, and Friday.   ACCU-CHEK GUIDE test strip USE TO TEST DAILY (Patient not taking: Reported on 07/13/2022)   blood glucose meter kit and supplies KIT Dispense based on patient and insurance preference. Check glucose daily, E11.65 (Patient not taking: Reported on 07/13/2022)   No facility-administered encounter medications on file as of  07/13/2022.    Allergies (verified) Patient has no known allergies.   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, requiring surgery   Memory deficit 05/20/2014   Partial symptomatic epilepsy with complex partial seizures, not intractable, without status epilepticus (HCC) 05/20/2014   most recent 09/10/20   Ventral hernia    Past Surgical History:  Procedure Laterality Date   BRAIN SURGERY  02/03/2020   COLONOSCOPY WITH PROPOFOL N/A 12/08/2020   Procedure: COLONOSCOPY WITH PROPOFOL;  Surgeon: Wyline Mood, MD;  Location: [facility] ENDOSCOPY;  Service: Gastroenterology;  Laterality: N/A;   ESOPHAGOGASTRODUODENOSCOPY N/A 12/08/2020   Procedure: ESOPHAGOGASTRODUODENOSCOPY (EGD);  Surgeon: Wyline Mood, MD;  Location: [facility] ENDOSCOPY;  Service: Gastroenterology;  Laterality: N/A;   HERNIA REPAIR     SUBDURAL HEMATOMA EVACUATION VIA CRANIOTOMY  01/2020   TUBAL LIGATION     VENTRAL HERNIA REPAIR     Family History  Problem Relation Age of Onset   Arthritis Mother    Cancer Brother        lung   Colon cancer Neg Hx    Esophageal cancer Neg Hx    Stomach cancer Neg Hx    Liver disease Neg Hx    Social  History   Socioeconomic History   Marital status: Divorced    Spouse name: Not on file   Number of children: 3   Years of education: 18 years   Highest education level: Not on file  Occupational History   Occupation: unemployed  Tobacco Use   Smoking status: Never   Smokeless tobacco: Never  Vaping Use   Vaping Use: Never used  Substance and Sexual Activity   Alcohol use: Never   Drug use: Never   Sexual activity: Never  Other Topics Concern   Not on file  Social History Narrative   10/06/20 lives with dgtr   Patient is right handed.   Patient does not drink caffeine.   Social Determinants of Health   Financial Resource  Strain: Low Risk  (07/13/2022)   Overall Financial Resource Strain (CARDIA)    Difficulty of Paying Living Expenses: Not hard at all  Food Insecurity: No Food Insecurity (07/13/2022)   Hunger Vital Sign    Worried About Running Out of Food in the Last Year: Never true    Ran Out of Food in the Last Year: Never true  Transportation Needs: No Transportation Needs (07/13/2022)   PRAPARE - Administrator, Civil Service (Medical): No    Lack of Transportation (Non-Medical): No  Physical Activity: Sufficiently Active (07/13/2022)   Exercise Vital Sign    Days of Exercise per Week: 7 days    Minutes of Exercise per Session: 30 min  Stress: No Stress Concern Present (07/13/2022)   [employer] of Occupational Health - Occupational Stress Questionnaire    Feeling of Stress : Not at all  Social Connections: Moderately Isolated (07/11/2021)   Social Connection and Isolation Panel [NHANES]    Frequency of Communication with Friends and Family: Three times a week    Frequency of Social Gatherings with Friends and Family: Three times a week    Attends Religious Services: More than 4 times per year    Active Member of Clubs or Organizations: No    Attends Banker Meetings: Never    Marital Status: Divorced    Tobacco Counseling Counseling given: Not Answered   Clinical Intake:  Pre-visit preparation completed: Yes  Pain : No/denies pain     Nutritional Status: BMI 25 -29 Overweight Nutritional Risks: None Diabetes: Yes  How often do you need to have someone help you when you read instructions, pamphlets, or other written materials from your doctor or pharmacy?: 1 - Never  Diabetic? Yes Nutrition Risk Assessment:  Has the patient had any N/V/D within the last 2 months?  No  Does the patient have any non-healing wounds?  No  Has the patient had any unintentional weight loss or weight gain?  Yes   Diabetes:  Is the patient diabetic?  Yes  If diabetic, was a CBG  obtained today?  No  Did the patient bring in their glucometer from home?  No  How often do you monitor your CBG's? Does not.   Financial Strains and Diabetes Management:  Are you having any financial strains with the device, your supplies or your medication? No .  Does the patient want to be seen by Chronic Care Management for management of their diabetes?  No  Would the patient like to be referred to a Nutritionist or for Diabetic Management?  No   Diabetic Exams:  Diabetic Eye Exam: Completed 01/24/2022 Diabetic Foot Exam: Completed 08/16/2021  Interpreter Needed?: No  Information entered by :: NAllen  LPN   Activities of Daily Living    07/13/2022    9:57 AM  In your present state of health, do you have any difficulty performing the following activities:  Hearing? 0  Vision? 0  Difficulty concentrating or making decisions? 1  Comment memory troubles  Walking or climbing stairs? 0  Dressing or bathing? 0  Doing errands, shopping? 1  Preparing Food and eating ? N  Using the Toilet? N  In the past six months, have you accidently leaked urine? N  Do you have problems with loss of bowel control? N  Managing your Medications? N  Managing your Finances? N  Housekeeping or managing your Housekeeping? N    Patient Care Team: Nche, Bonna Gains, NP as PCP - General (Internal Medicine) Gaspar Cola, [facility] (Pharmacist)  Indicate any recent Medical Services you may have received from other than [facility] providers in the past year (date may be approximate).     Assessment:   This is a routine wellness examination for Tamicka.  Hearing/Vision screen Vision Screening - Comments:: No regular eye exams,   Dietary issues and exercise activities discussed: Current Exercise Habits: Home exercise routine, Type of exercise: walking, Time (Minutes): 30, Frequency (Times/Week): 7, Weekly Exercise (Minutes/Week): 210   Goals Addressed             This Visit's Progress     Patient Stated       07/13/2022, wants to gain weight       Depression Screen    07/13/2022    9:57 AM 06/12/2022    9:08 AM 07/11/2021   10:56 AM 07/11/2021   10:52 AM 04/25/2021   10:31 AM 04/20/2021   11:03 AM 11/18/2012   10:13 AM  PHQ 2/9 Scores  PHQ - 2 Score 0 0 0 0 0 2 0  PHQ- 9 Score      2     Fall Risk    07/13/2022    9:57 AM 09/04/2021    1:12 PM 07/11/2021   10:55 AM 04/25/2021   10:30 AM  Fall Risk   Falls in the past year? 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 0 0 0   Risk for fall due to : Medication side effect     Follow up Falls prevention discussed;Education provided;Falls evaluation completed  Falls evaluation completed     FALL RISK PREVENTION PERTAINING TO THE HOME:  Any stairs in or around the home? Yes  If so, are there any without handrails? No  Home free of loose throw rugs in walkways, pet beds, electrical cords, etc? Yes  Adequate lighting in your home to reduce risk of falls? Yes   ASSISTIVE DEVICES UTILIZED TO PREVENT FALLS:  Life alert? No  Use of a cane, walker or w/c? No  Grab bars in the bathroom? No  Shower chair or bench in shower? Yes  Elevated toilet seat or a handicapped toilet? Yes   TIMED UP AND GO:  Was the test performed? Yes .  Length of time to ambulate 10 feet: 5 sec.   Gait steady and fast without use of assistive device  Cognitive Function:    10/06/2020    9:47 AM 11/19/2014    8:14 AM 05/20/2014    8:06 AM  MMSE - Mini Mental State Exam  Orientation to time 4 5 5  Orientation to Place 4 5 5   Registration 3 3 3   Attention/ Calculation 4 5 5   Recall 2 2 2   Language- name 2 objects 2 2 2   Language- repeat 0 1 1  Language- follow 3 step command 3 3 3   Language- read & follow direction 1 1 1   Write a sentence 1 1 1   Copy design 1 1 1   Total score 25 29 29         07/13/2022    9:58 AM  6CIT Screen  What Year? 0 points  What month? 0  points  What time? 0 points  Count back from 20 0 points  Months in reverse 0 points  Repeat phrase 10 points  Total Score 10 points    Immunizations Immunization History  Administered Date(s) Administered   PFIZER(Purple Top)SARS-COV-2 Vaccination 09/01/2019, 09/22/2019    TDAP status: Up to date  Flu Vaccine status: Up to date  Pneumococcal vaccine status: Declined,  Education has been provided regarding the importance of this vaccine but patient still declined. Advised may receive this vaccine at local pharmacy or Health Dept. Aware to provide a copy of the vaccination record if obtained from local pharmacy or Health Dept. Verbalized acceptance and understanding.   Covid-19 vaccine status: Completed vaccines  Qualifies for Shingles Vaccine? Yes   Zostavax completed No   Shingrix Completed?: No.    Education has been provided regarding the importance of this vaccine. Patient has been advised to call insurance company to determine out of pocket expense if they have not yet received this vaccine. Advised may also receive vaccine at local pharmacy or Health Dept. Verbalized acceptance and understanding.  Screening Tests Health Maintenance  Topic Date Due   COVID-19 Vaccine (3 - 2023-24 season) 10/06/2021   Hepatitis C Screening  09/05/2022 (Originally 03/16/1963)   Zoster Vaccines- Shingrix (1 of 2) 09/12/2022 (Originally 03/16/1996)   Pneumonia Vaccine 29+ Years old (1 of 1 - PCV) 06/12/2023 (Originally 03/17/2011)   FOOT EXAM  08/17/2022   Diabetic kidney evaluation - Urine ACR  09/05/2022   INFLUENZA VACCINE  09/06/2022   HEMOGLOBIN A1C  12/13/2022   OPHTHALMOLOGY EXAM  01/25/2023   Diabetic kidney evaluation - eGFR measurement  06/12/2023   DEXA SCAN  Completed   HPV VACCINES  Aged Out   DTaP/Tdap/Td  Discontinued   Colonoscopy  Discontinued    Health Maintenance  Health Maintenance Due  Topic Date Due   COVID-19 Vaccine (3 - 2023-24 season) 10/06/2021    Colorectal  cancer screening: No longer required.   Mammogram status: No longer required due to age.  Bone Density status: Completed 09/12/2021.   Lung Cancer Screening: (Low Dose CT Chest recommended if Age 66-80 years, 30 pack-year currently smoking OR have quit w/in 15years.) does not qualify.   Lung Cancer Screening Referral: no  Additional Screening:  Hepatitis C Screening: does qualify;   Vision Screening: Recommended annual ophthalmology exams for early detection of glaucoma and other disorders of the eye. Is the patient up to date with their annual eye exam?  No  Who is the provider or what is the name of the office in which the patient attends annual eye exams? none If pt is not established with a provider, would they like to be referred to a provider to establish care? No .   Dental Screening: Recommended annual dental exams for proper oral hygiene  Community Resource Referral / Chronic Care Management: CRR required this visit?  No   CCM  required this visit?  No      Plan:     I have personally reviewed and noted the following in the patient's chart:   Medical and social history Use of alcohol, tobacco or illicit drugs  Current medications and supplements including opioid prescriptions. Patient is not currently taking opioid prescriptions. Functional ability and status Nutritional status Physical activity Advanced directives List of other physicians Hospitalizations, surgeries, and ER visits in previous 12 months Vitals Screenings to include cognitive, depression, and falls Referrals and appointments  In addition, I have reviewed and discussed with patient certain preventive protocols, quality metrics, and best practice recommendations. A written personalized care plan for preventive services as well as general preventive health recommendations were provided to patient.     Barb Merino, LPN   0/10/8117   Nurse Notes: none

## 2022-07-13 NOTE — Assessment & Plan Note (Addendum)
secondary to seizure activity.  Associated with transient amnesia and blank stare which can last about per daughter. postictal symptoms are poor memory and generalized weakness,  No observed seizure activity per daughter in last 6months. Daughter does not allow her to drive. Last EEG 2022: no seizure activity noted MRI 2016: Mildly abnormal MRI brain (with and without) demonstrating Minimal periventricular and subcortical foci of non-specific gliosis. No abnormal lesions are seen on post contrast views.No acute findings. Under the care of Dr. Omar Person, NP (neurology) Current use of lacosamide and trileptal (admits she has been noncompliant with trileptal dose) Advised about the importance of medication compliance.

## 2022-07-13 NOTE — Assessment & Plan Note (Signed)
5lbs weight gain noted. Reports eating daily Does not drink ensure as recommended Wt Readings from Last 3 Encounters:  07/13/22 141 lb 12.8 oz (64.3 kg)  07/13/22 142 lb 6.4 oz (64.6 kg)  06/12/22 136 lb 12.8 oz (62.1 kg)    Normal prealbumin, THYROID, cbc, CMP Will continue to monitor

## 2022-07-13 NOTE — Assessment & Plan Note (Signed)
She refuses to take metformin due to fear of possible side effects. Since hgbA1c is 6.1%, we decided to hold metformin at this time. F/up in 3months

## 2022-07-13 NOTE — Patient Instructions (Addendum)
Hold metformin Take other medications as prescribed Allow your daughter and granddaughter help you with your medications.

## 2022-07-13 NOTE — Assessment & Plan Note (Signed)
No change in GI function, no blood in stool Last colonoscopy 12/2020: B. COLON POLYP, ASCENDING; COLD SNARE: TUBULAR ADENOMA. NEGATIVE FOR HIGH-GRADE DYSPLASIA AND MALIGNANCY.

## 2022-07-13 NOTE — Patient Instructions (Signed)
Sabrina Guerrero , Thank you for taking time to come for your Medicare Wellness Visit. I appreciate your ongoing commitment to your health goals. Please review the following plan we discussed and let me know if I can assist you in the future.   These are the goals we discussed:  Goals      Patient Stated     07/13/2022, wants to gain weight        This is a list of the screening recommended for you and due dates:  Health Maintenance  Topic Date Due   COVID-19 Vaccine (3 - 2023-24 season) 10/06/2021   Hepatitis C Screening  09/05/2022*   Zoster (Shingles) Vaccine (1 of 2) 09/12/2022*   Pneumonia Vaccine (1 of 1 - PCV) 06/12/2023*   Complete foot exam   08/17/2022   Yearly kidney health urinalysis for diabetes  09/05/2022   Flu Shot  09/06/2022   Hemoglobin A1C  12/13/2022   Eye exam for diabetics  01/25/2023   Yearly kidney function blood test for diabetes  06/12/2023   DEXA scan (bone density measurement)  Completed   HPV Vaccine  Aged Out   DTaP/Tdap/Td vaccine  Discontinued   Colon Cancer Screening  Discontinued  *Topic was postponed. The date shown is not the original due date.    Advanced directives: Advance directive discussed with you today.  Conditions/risks identified: none  Next appointment: Follow up in one year for your annual wellness visit    Preventive Care 65 Years and Older, Female Preventive care refers to lifestyle choices and visits with your health care provider that can promote health and wellness. What does preventive care include? A yearly physical exam. This is also called an annual well check. Dental exams once or twice a year. Routine eye exams. Ask your health care provider how often you should have your eyes checked. Personal lifestyle choices, including: Daily care of your teeth and gums. Regular physical activity. Eating a healthy diet. Avoiding tobacco and drug use. Limiting alcohol use. Practicing safe sex. Taking low-dose aspirin every  day. Taking vitamin and mineral supplements as recommended by your health care provider. What happens during an annual well check? The services and screenings done by your health care provider during your annual well check will depend on your age, overall health, lifestyle risk factors, and family history of disease. Counseling  Your health care provider may ask you questions about your: Alcohol use. Tobacco use. Drug use. Emotional well-being. Home and relationship well-being. Sexual activity. Eating habits. History of falls. Memory and ability to understand (cognition). Work and work Astronomer. Reproductive health. Screening  You may have the following tests or measurements: Height, weight, and BMI. Blood pressure. Lipid and cholesterol levels. These may be checked every 5 years, or more frequently if you are over 63 years old. Skin check. Lung cancer screening. You may have this screening every year starting at age 57 if you have a 30-pack-year history of smoking and currently smoke or have quit within the past 15 years. Fecal occult blood test (FOBT) of the stool. You may have this test every year starting at age 68. Flexible sigmoidoscopy or colonoscopy. You may have a sigmoidoscopy every 5 years or a colonoscopy every 10 years starting at age 39. Hepatitis C blood test. Hepatitis B blood test. Sexually transmitted disease (STD) testing. Diabetes screening. This is done by checking your blood sugar (glucose) after you have not eaten for a while (fasting). You may have this done every 1-3 years.  Bone density scan. This is done to screen for osteoporosis. You may have this done starting at age 62. Mammogram. This may be done every 1-2 years. Talk to your health care provider about how often you should have regular mammograms. Talk with your health care provider about your test results, treatment options, and if necessary, the need for more tests. Vaccines  Your health care  provider may recommend certain vaccines, such as: Influenza vaccine. This is recommended every year. Tetanus, diphtheria, and acellular pertussis (Tdap, Td) vaccine. You may need a Td booster every 10 years. Zoster vaccine. You may need this after age 65. Pneumococcal 13-valent conjugate (PCV13) vaccine. One dose is recommended after age 22. Pneumococcal polysaccharide (PPSV23) vaccine. One dose is recommended after age 67. Talk to your health care provider about which screenings and vaccines you need and how often you need them. This information is not intended to replace advice given to you by your health care provider. Make sure you discuss any questions you have with your health care provider. Document Released: 02/18/2015 Document Revised: 10/12/2015 Document Reviewed: 11/23/2014 Elsevier Interactive Patient Education  2017 [employer].  Fall Prevention in the Home Falls can cause injuries. They can happen to people of all ages. There are many things you can do to make your home safe and to help prevent falls. What can I do on the outside of my home? Regularly fix the edges of walkways and driveways and fix any cracks. Remove anything that might make you trip as you walk through a door, such as a raised step or threshold. Trim any bushes or trees on the path to your home. Use bright outdoor lighting. Clear any walking paths of anything that might make someone trip, such as rocks or tools. Regularly check to see if handrails are loose or broken. Make sure that both sides of any steps have handrails. Any raised decks and porches should have guardrails on the edges. Have any leaves, snow, or ice cleared regularly. Use sand or salt on walking paths during winter. Clean up any spills in your garage right away. This includes oil or grease spills. What can I do in the bathroom? Use night lights. Install grab bars by the toilet and in the tub and shower. Do not use towel bars as grab  bars. Use non-skid mats or decals in the tub or shower. If you need to sit down in the shower, use a plastic, non-slip stool. Keep the floor dry. Clean up any water that spills on the floor as soon as it happens. Remove soap buildup in the tub or shower regularly. Attach bath mats securely with double-sided non-slip rug tape. Do not have throw rugs and other things on the floor that can make you trip. What can I do in the bedroom? Use night lights. Make sure that you have a light by your bed that is easy to reach. Do not use any sheets or blankets that are too big for your bed. They should not hang down onto the floor. Have a firm chair that has side arms. You can use this for support while you get dressed. Do not have throw rugs and other things on the floor that can make you trip. What can I do in the kitchen? Clean up any spills right away. Avoid walking on wet floors. Keep items that you use a lot in easy-to-reach places. If you need to reach something above you, use a strong step stool that has a grab bar.  Keep electrical cords out of the way. Do not use floor polish or wax that makes floors slippery. If you must use wax, use non-skid floor wax. Do not have throw rugs and other things on the floor that can make you trip. What can I do with my stairs? Do not leave any items on the stairs. Make sure that there are handrails on both sides of the stairs and use them. Fix handrails that are broken or loose. Make sure that handrails are as long as the stairways. Check any carpeting to make sure that it is firmly attached to the stairs. Fix any carpet that is loose or worn. Avoid having throw rugs at the top or bottom of the stairs. If you do have throw rugs, attach them to the floor with carpet tape. Make sure that you have a light switch at the top of the stairs and the bottom of the stairs. If you do not have them, ask someone to add them for you. What else can I do to help prevent  falls? Wear shoes that: Do not have high heels. Have rubber bottoms. Are comfortable and fit you well. Are closed at the toe. Do not wear sandals. If you use a stepladder: Make sure that it is fully opened. Do not climb a closed stepladder. Make sure that both sides of the stepladder are locked into place. Ask someone to hold it for you, if possible. Clearly mark and make sure that you can see: Any grab bars or handrails. First and last steps. Where the edge of each step is. Use tools that help you move around (mobility aids) if they are needed. These include: Canes. Walkers. Scooters. Crutches. Turn on the lights when you go into a dark area. Replace any light bulbs as soon as they burn out. Set up your furniture so you have a clear path. Avoid moving your furniture around. If any of your floors are uneven, fix them. If there are any pets around you, be aware of where they are. Review your medicines with your doctor. Some medicines can make you feel dizzy. This can increase your chance of falling. Ask your doctor what other things that you can do to help prevent falls. This information is not intended to replace advice given to you by your health care provider. Make sure you discuss any questions you have with your health care provider. Document Released: 11/18/2008 Document Revised: 06/30/2015 Document Reviewed: 02/26/2014 Elsevier Interactive Patient Education  2017 [employer].

## 2022-07-13 NOTE — Progress Notes (Signed)
Established Patient Visit  Patient: Sabrina Guerrero   DOB: 1946/01/21   77 y.o. Female  MRN: [id number] Visit Date: 07/13/2022  Subjective:    Chief Complaint  Patient presents with   Weight Loss   HPI Accompanied by daughter.  Type 2 diabetes mellitus with hyperglycemia, without long-term current use of insulin (HCC) She refuses to take metformin due to fear of possible side effects. Since hgbA1c is 6.1%, we decided to hold metformin at this time. 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  Associated with transient amnesia and blank stare which can last about per daughter. postictal symptoms are poor memory and generalized weakness,  No observed seizure activity per daughter in last 6months. Daughter does not allow her to drive. Last EEG 2022: no seizure activity noted MRI 2016: Mildly abnormal MRI brain (with and without) demonstrating Minimal periventricular and subcortical foci of non-specific gliosis. No abnormal lesions are seen on post contrast views.No acute findings. Under the care of Dr. Omar Person, NP (neurology) Current use of lacosamide and trileptal (admits she has been noncompliant with trileptal dose) Advised about the importance of medication compliance.    GERD (gastroesophageal reflux disease) Controlled symptoms with omeprazole 40mg  Upper endoscopy 12/2020:normal, pathology report (ESOPHAGUS; COLD BIOPSY: BENIGN SQUAMOUS MUCOSA WITH NO SIGNIFICANT HISTOPATHOLOGIC CHANGE. NO INCREASE IN INTRAEPITHELIAL EOSINOPHILS (LESS THAN 2 PER HPF). NEGATIVE FOR DYSPLASIA AND MALIGNANCY.  Maintain med dose  Tubular adenoma of colon No change in GI function, no blood in stool Last colonoscopy 12/2020: B. COLON POLYP, ASCENDING; COLD SNARE: TUBULAR ADENOMA. NEGATIVE FOR HIGH-GRADE DYSPLASIA AND MALIGNANCY.   Weight loss, abnormal 5lbs weight gain noted. Reports eating daily Does not drink ensure as  recommended Wt Readings from Last 3 Encounters:  07/13/22 141 lb 12.8 oz (64.3 kg)  07/13/22 142 lb 6.4 oz (64.6 kg)  06/12/22 136 lb 12.8 oz (62.1 kg)    Normal prealbumin, THYROID, cbc, CMP Will continue to monitor  Wt Readings from Last 3 Encounters:  07/13/22 141 lb 12.8 oz (64.3 kg)  07/13/22 142 lb 6.4 oz (64.6 kg)  06/12/22 136 lb 12.8 oz (62.1 kg)    Reviewed medical, surgical, and social history today  Medications: Outpatient Medications Prior to Visit  Medication Sig   Lacosamide 150 MG TABS TAKE ONE TABLET BY MOUTH BEFORE BREAKFAST and TAKE ONE TABLET BY MOUTH EVERY EVENING   omeprazole (PRILOSEC) 40 MG capsule TAKE ONE CAPSULE BY MOUTH AT BREAKFAST AND AT BEDTIME   OXcarbazepine (TRILEPTAL) 150 MG tablet Take 1 tablet (150 mg total) by mouth 2 (two) times daily.   ACCU-CHEK GUIDE test strip USE TO TEST DAILY (Patient not taking: Reported on 07/13/2022)   blood glucose meter kit and supplies KIT Dispense based on patient and insurance preference. Check glucose daily, E11.65 (Patient not taking: Reported on 07/13/2022)   rosuvastatin (CRESTOR) 20 MG tablet TAKE 1 TABLET BY MOUTH EVERY DAY (Patient not taking: Reported on 07/13/2022)   thiamine 100 MG tablet Take 1 tablet by mouth every Monday, Wednesday, and Friday. (Patient not taking: Reported on 07/13/2022)   [DISCONTINUED] metFORMIN (GLUCOPHAGE) 500 MG tablet TAKE 0.5 TABLETS (250 MG TOTAL) BY MOUTH 2 (TWO) TIMES DAILY WITH A MEAL. (Patient not taking: Reported on 07/13/2022)   No facility-administered medications prior to visit.   Reviewed past medical and social history.   ROS per HPI above  Last CBC Lab Results  Component Value Date   WBC 4.3 06/12/2022   HGB 12.4 06/12/2022   HCT 37.3 06/12/2022   MCV 97.3 06/12/2022   MCH 31.8 09/24/2021   RDW 12.7 06/12/2022   PLT 281.0 06/12/2022   Last metabolic panel Lab Results  Component Value Date   GLUCOSE 104 (H) 06/12/2022   NA 129 (L) 06/12/2022   K 4.3 06/12/2022    CL 95 (L) 06/12/2022   CO2 26 06/12/2022   BUN 7 06/12/2022   CREATININE 0.61 06/12/2022   EGFR 82 03/01/2022   CALCIUM 9.3 06/12/2022   PHOS 3.8 06/12/2022   PROT 7.3 03/01/2022   ALBUMIN 3.8 06/12/2022   LABGLOB 3.1 03/01/2022   AGRATIO 1.4 03/01/2022   BILITOT 0.4 03/01/2022   ALKPHOS 77 03/01/2022   AST 14 03/01/2022   ALT 6 03/01/2022   ANIONGAP 9 09/24/2021   Last hemoglobin A1c Lab Results  Component Value Date   HGBA1C 6.1 06/12/2022   Last thyroid functions Lab Results  Component Value Date   TSH 0.51 06/12/2022        Objective:  BP 108/60 (BP Location: Right Arm, Patient Position: Sitting)   Pulse 75   Temp 98.2 F (36.8 C)   Ht 5\' 1"  (1.549 m)   Wt 141 lb 12.8 oz (64.3 kg)   SpO2 97%   BMI 26.79 kg/m      Physical Exam Vitals reviewed.  Cardiovascular:     Rate and Rhythm: Normal rate and regular rhythm.     Pulses: Normal pulses.     Heart sounds: Normal heart sounds.  Pulmonary:     Effort: Pulmonary effort is normal.     Breath sounds: Normal breath sounds.  Neurological:     Mental Status: She is alert and oriented to person, place, and time.  Psychiatric:        Mood and Affect: Mood normal.        Behavior: Behavior normal.     No results found for any visits on 07/13/22.    Assessment & Plan:    Problem List Items Addressed This Visit       Digestive   GERD (gastroesophageal reflux disease)    Controlled symptoms with omeprazole 40mg  Upper endoscopy 12/2020:normal, pathology report (ESOPHAGUS; COLD BIOPSY: BENIGN SQUAMOUS MUCOSA WITH NO SIGNIFICANT HISTOPATHOLOGIC CHANGE. NO INCREASE IN INTRAEPITHELIAL EOSINOPHILS (LESS THAN 2 PER HPF). NEGATIVE FOR DYSPLASIA AND MALIGNANCY.  Maintain med dose        Endocrine   Type 2 diabetes mellitus with hyperglycemia, without long-term current use of insulin (HCC) - Primary    She refuses to take metformin due to fear of possible side effects. Since hgbA1c is 6.1%, we decided to  hold metformin at this time. 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  Associated with transient amnesia and blank stare which can last about per daughter. postictal symptoms are poor memory and generalized weakness,  No observed seizure activity per daughter in last 6months. Daughter does not allow her to drive. Last EEG 2022: no seizure activity noted MRI 2016: Mildly abnormal MRI brain (with and without) demonstrating Minimal periventricular and subcortical foci of non-specific gliosis. No abnormal lesions are seen on post contrast views.No acute findings. Under the care of Dr. Omar Person, NP (neurology) Current use of lacosamide and trileptal (admits she has been noncompliant with trileptal dose) Advised about the importance of medication  compliance.        Weight loss, abnormal    5lbs weight gain noted. Reports eating daily Does not drink ensure as recommended Wt Readings from Last 3 Encounters:  07/13/22 141 lb 12.8 oz (64.3 kg)  07/13/22 142 lb 6.4 oz (64.6 kg)  06/12/22 136 lb 12.8 oz (62.1 kg)    Normal prealbumin, THYROID, cbc, CMP Will continue to monitor      Return in about 3 months (around 10/13/2022) for DM, hyperlipidemia (fasting).     Alysia Penna, NP

## 2022-07-13 NOTE — Assessment & Plan Note (Addendum)
Controlled symptoms with omeprazole 40mg  Upper endoscopy 12/2020:normal, pathology report (ESOPHAGUS; COLD BIOPSY: BENIGN SQUAMOUS MUCOSA WITH NO SIGNIFICANT HISTOPATHOLOGIC CHANGE. NO INCREASE IN INTRAEPITHELIAL EOSINOPHILS (LESS THAN 2 PER HPF). NEGATIVE FOR DYSPLASIA AND MALIGNANCY.  Maintain med dose

## 2022-07-25 IMAGING — CR DG KNEE COMPLETE 4+V*R*
4 series · 4 of 4 positions shown · non-contrast
Comparison: None.

CLINICAL DATA: Fall

EXAM:
RIGHT KNEE - COMPLETE 4+ VIEW

[x knee ap right (1 of 3)]
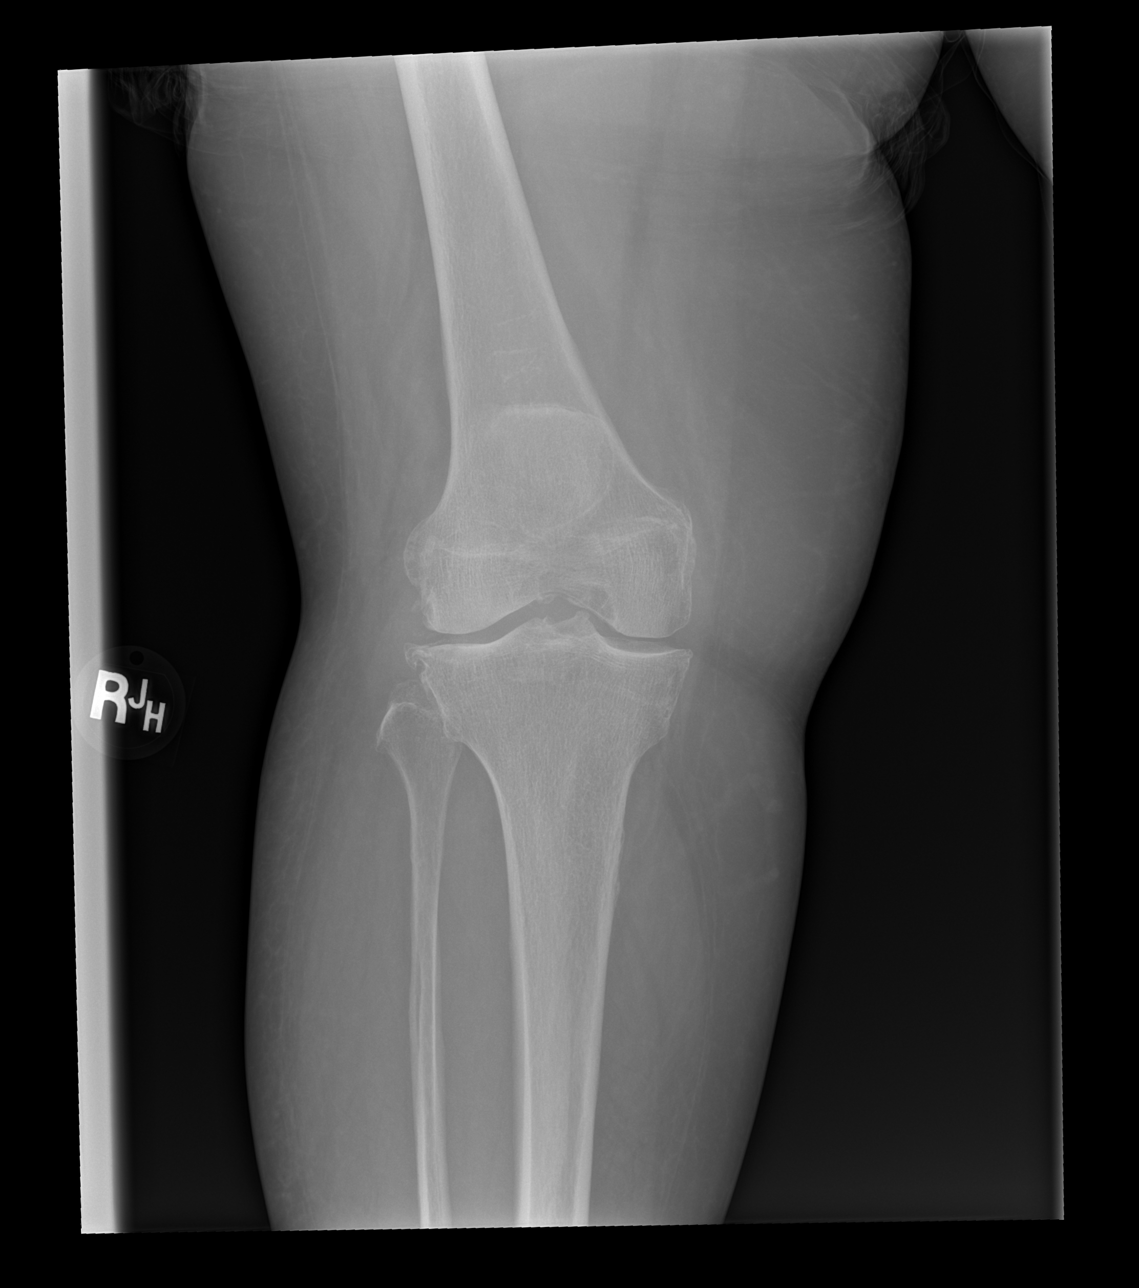

[x knee ap right (2 of 3)]
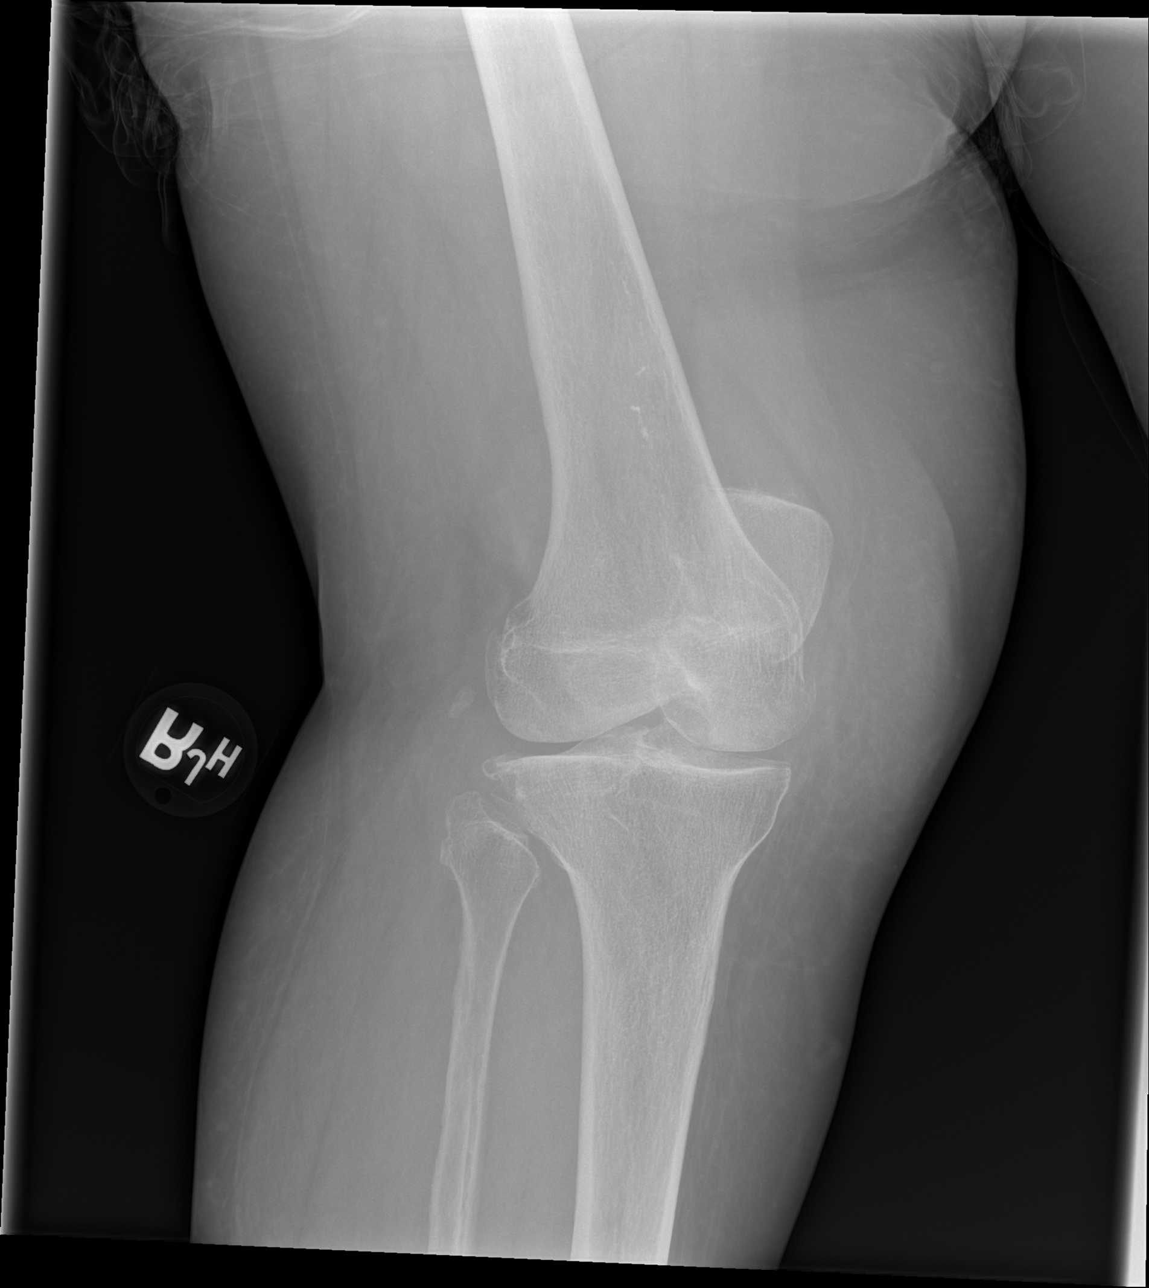

[x knee ap right (3 of 3)]
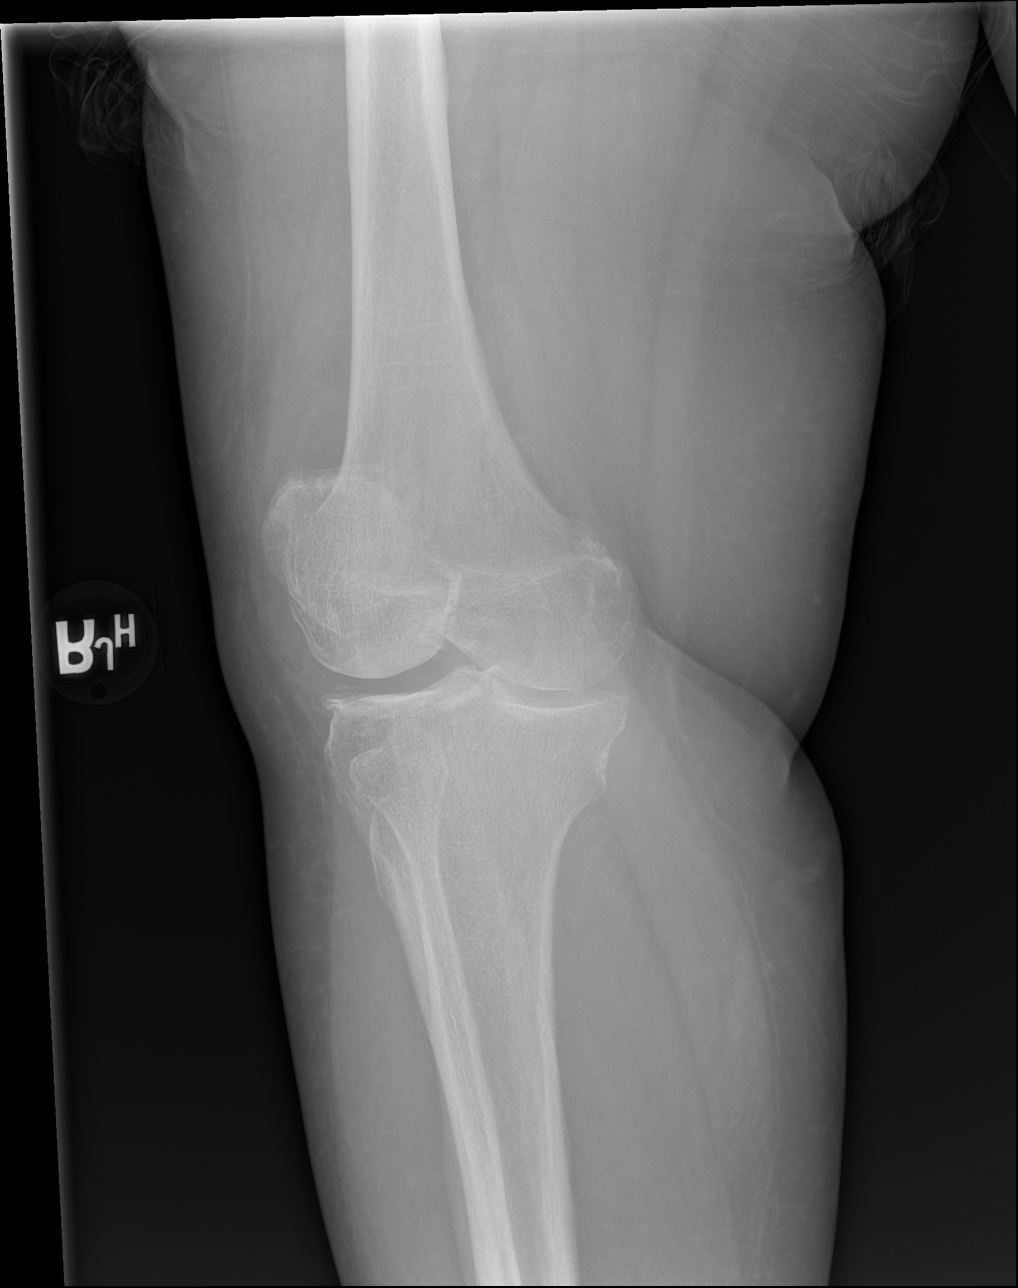

[x knee lat right]
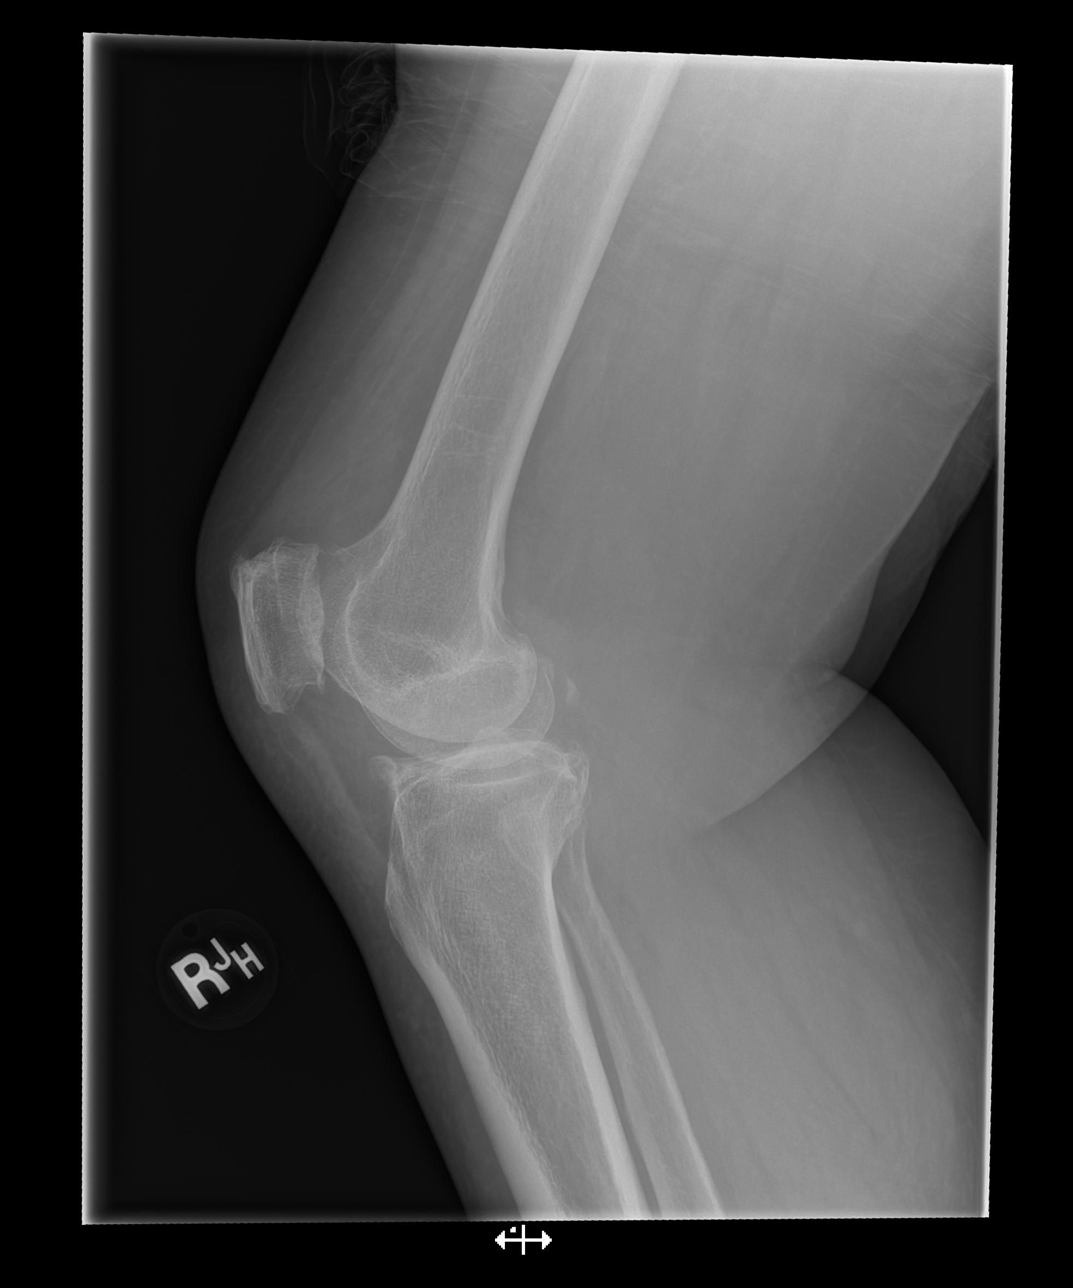

[4 of 4 positions shown; findings below may reference images not displayed]

FINDINGS: Moderate right knee osteoarthrosis. No fracture or dislocation. No
joint effusion.
IMPRESSION: No fracture or dislocation of the right knee.

## 2022-07-25 IMAGING — CT CT MAXILLOFACIAL W/O CM
3 of 4 series · 14 of 47 positions shown, 16 images · non-contrast
Comparison: None.

CLINICAL DATA: Fall, facial trauma, facial and nasal pain, loosen
dentition

EXAM:
CT HEAD WITHOUT CONTRAST
CT MAXILLOFACIAL WITHOUT CONTRAST
TECHNIQUE: Multidetector CT imaging of the head and maxillofacial structures
were performed using the standard protocol without intravenous
contrast. Multiplanar CT image reconstructions of the maxillofacial
structures were also generated.

[Series 3: max soft · axial · 0.33mm/px · z∈[-268,-124]mm · 8 of 84 slices shown, 10 images]
[im 6/84  brain]
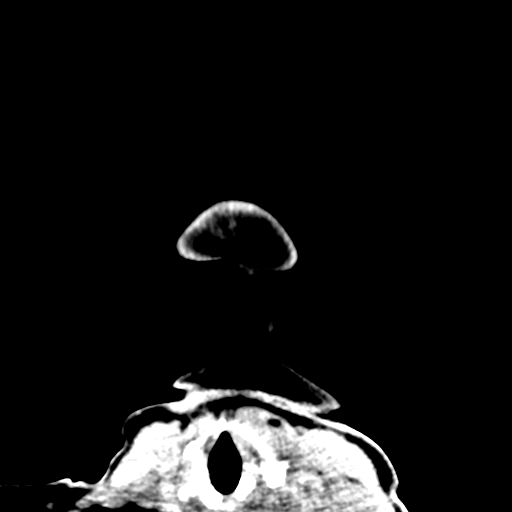
[im 6/84  bone]
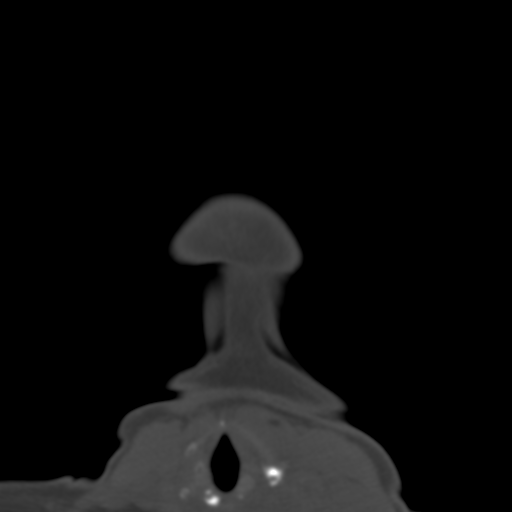
[im 18/84  bone]
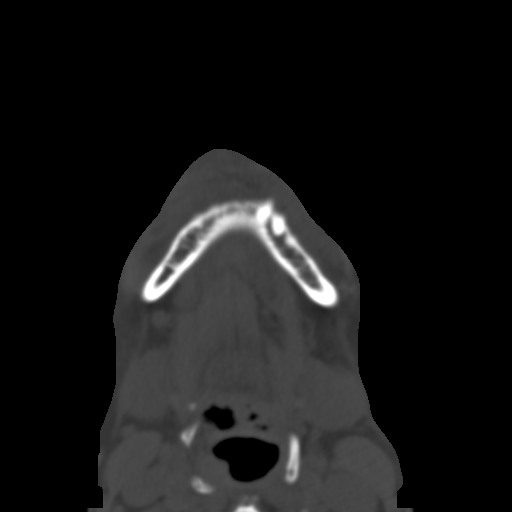
[im 26/84  bone]
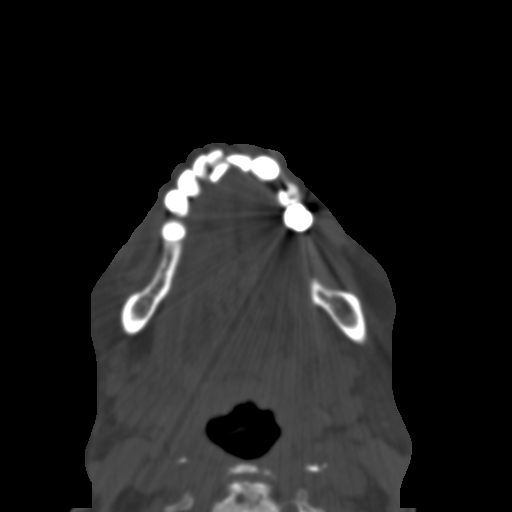
[im 38/84  bone]
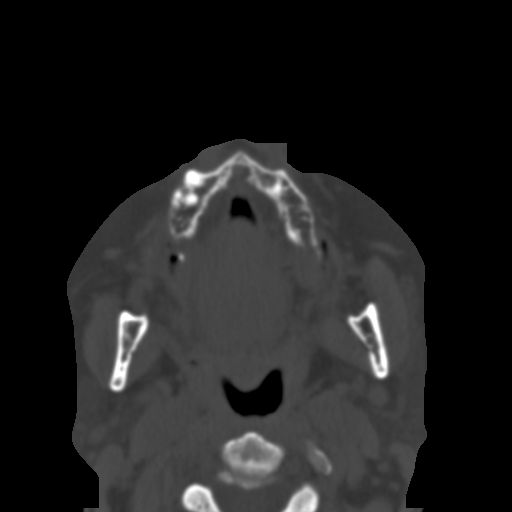
[im 46/84  brain]
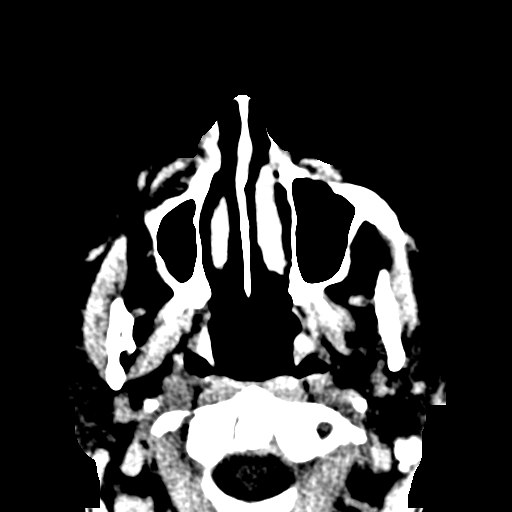
[im 46/84  bone]
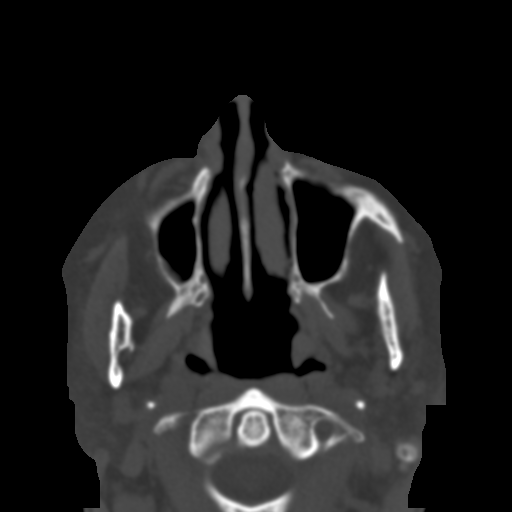
[im 58/84  bone]
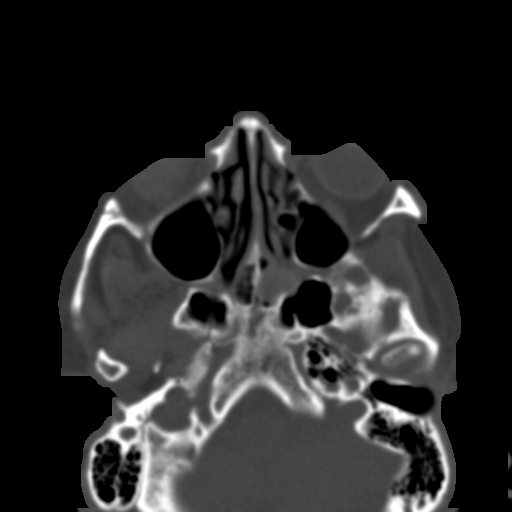
[im 66/84  bone]
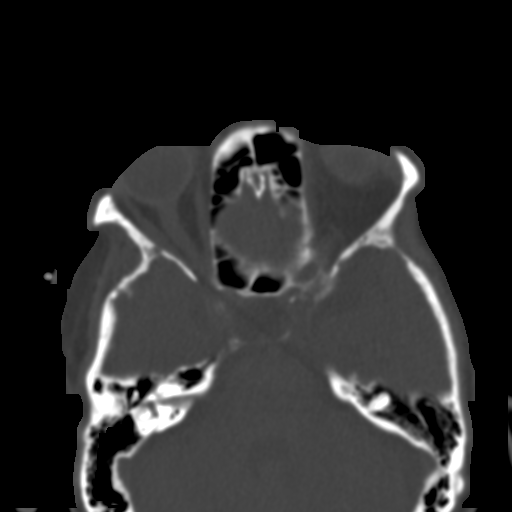
[im 78/84  bone]
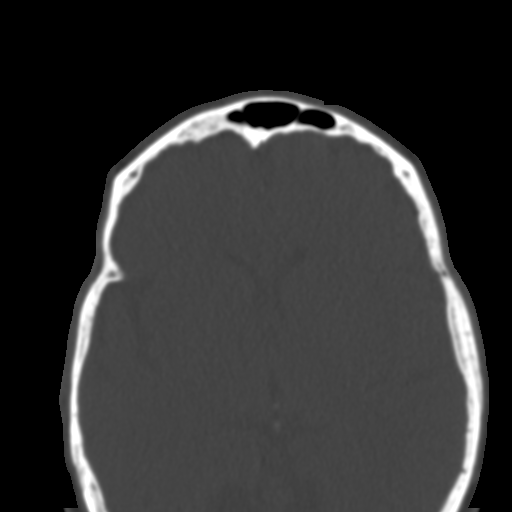

[Series 8: sagittal soft · sagittal · 0.30mm/px · 3 of 79 slices shown]
[im 27/79  bone]
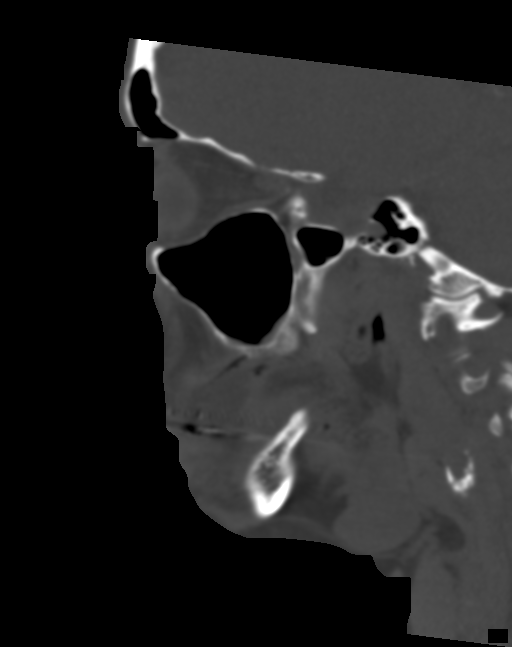
[im 40/79  bone]
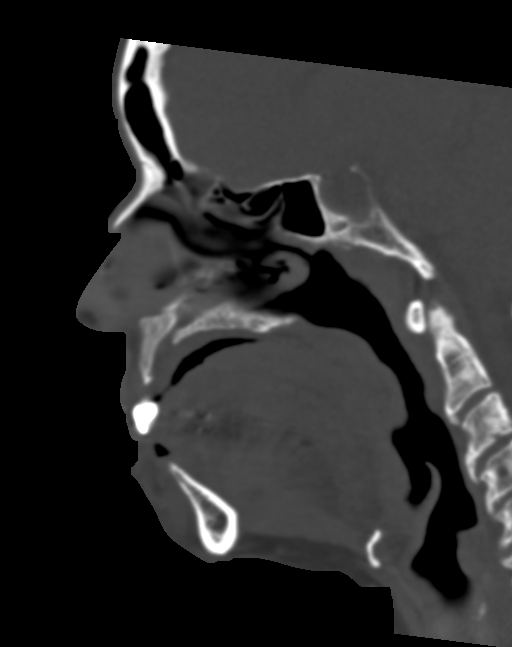
[im 53/79  bone]
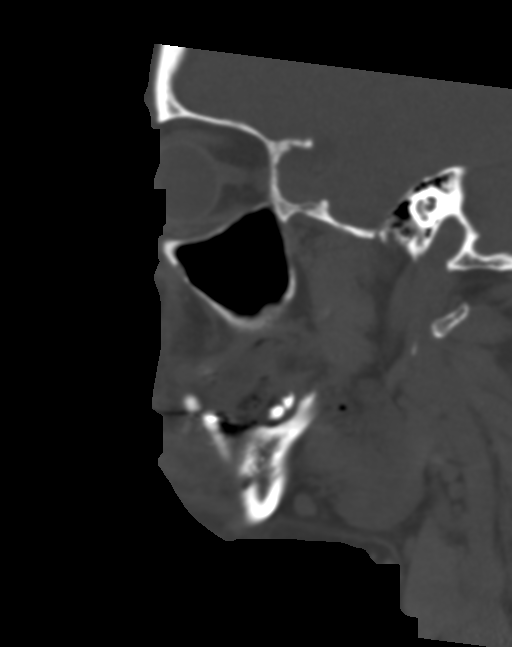

[Series 9: coronal bone · coronal · 0.30mm/px · 3 of 75 slices shown]
[im 19/75  bone]
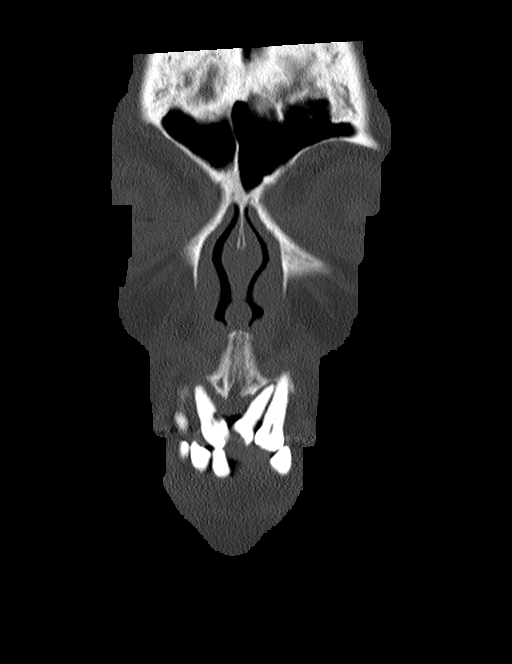
[im 38/75  bone]
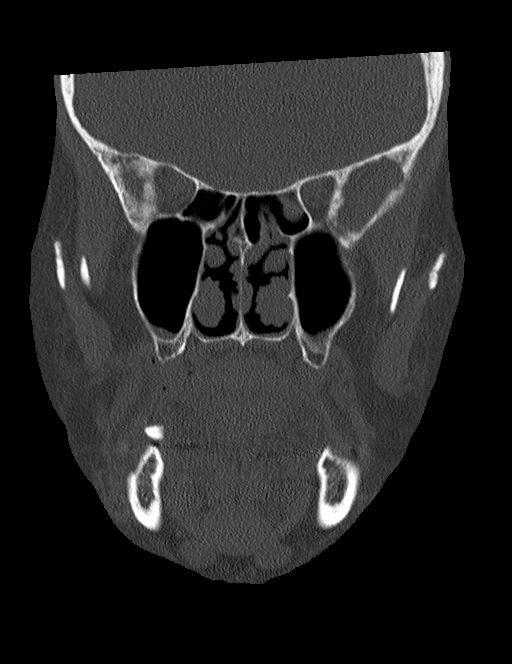
[im 56/75  bone]
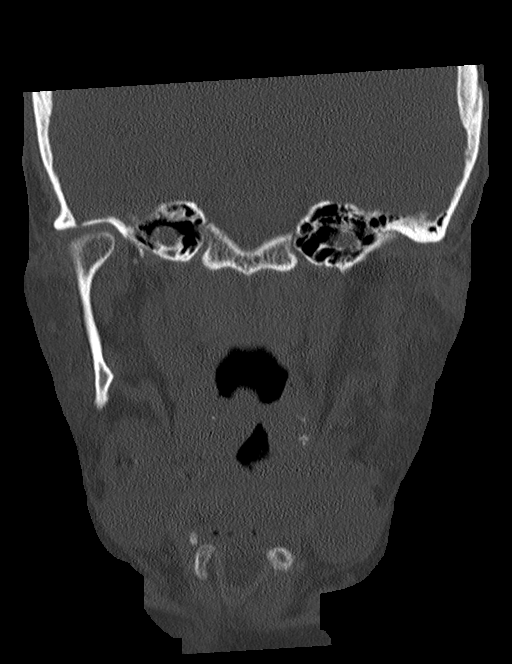

[14 of 47 positions shown; findings below may reference images not displayed]

FINDINGS: CT HEAD FINDINGS

Brain: Normal anatomic configuration. No abnormal intra or
extra-axial mass lesion or fluid collection. No abnormal mass effect
or midline shift. No evidence of acute intracranial hemorrhage or
infarct. Ventricular size is normal. Cerebellum unremarkable.

Vascular: Unremarkable

Skull: Intact

Other: Mastoid air cells and middle ear cavities are clear.

CT MAXILLOFACIAL FINDINGS

Osseous: There is an acute, minimally displaced fracture of the
right nasal bone. Periapical abscesses and caries involve the
residual maxillary and, to a lesser extent, mandibular incisors as
well as the right maxillary canine with significant undermining of
the residual right maxillary incisor.

Orbits: The orbits are unremarkable.

Sinuses: Small mucous retention cysts or polyps within the left
sphenoid sinus. Remaining paranasal sinuses are clear.

Soft tissues: Soft tissue swelling noted anterior to the mandibular
mentum. Facial soft tissues are otherwise unremarkable.
IMPRESSION: No acute intracranial injury.  No calvarial fracture.

Minimally displaced right nasal bone fracture. Near anatomic
alignment.

Extensive periodontal disease as described above.

Mild soft tissue swelling superficial to the mandibular mentum. No
mandibular fracture associated.

## 2022-08-08 ENCOUNTER — Other Ambulatory Visit: Payer: Self-pay | Admitting: Nurse Practitioner

## 2022-08-08 DIAGNOSIS — E1169 Type 2 diabetes mellitus with other specified complication: Secondary | ICD-10-CM

## 2022-09-17 ENCOUNTER — Telehealth: Payer: Self-pay | Admitting: Nurse Practitioner

## 2022-09-17 ENCOUNTER — Other Ambulatory Visit: Payer: Self-pay | Admitting: Nurse Practitioner

## 2022-09-17 DIAGNOSIS — E1169 Type 2 diabetes mellitus with other specified complication: Secondary | ICD-10-CM

## 2022-09-17 NOTE — Telephone Encounter (Signed)
Called and talked to her daughter.  Per her daughter, she is still taking her Crestor.  [employer] called her and said she hasn't refilled her medication in a while. The patient was prescribed 90 days with one refill in April and still has medication left.

## 2022-09-17 NOTE — Telephone Encounter (Signed)
[employer] is requesting you call the pt to let her know she should still be taking her rosuvastatin (CRESTOR) 20 MG tablet [[id number]] . The pt is unsure, confused.

## 2022-09-18 ENCOUNTER — Encounter: Payer: Self-pay | Admitting: Neurology

## 2022-09-18 ENCOUNTER — Ambulatory Visit: Payer: 59 | Admitting: Neurology

## 2022-09-18 ENCOUNTER — Ambulatory Visit (INDEPENDENT_AMBULATORY_CARE_PROVIDER_SITE_OTHER): Payer: 59 | Admitting: Neurology

## 2022-09-18 VITALS — BP 113/70 | HR 71 | Ht 62.0 in | Wt 139.0 lb

## 2022-09-18 DIAGNOSIS — G40209 Localization-related (focal) (partial) symptomatic epilepsy and epileptic syndromes with complex partial seizures, not intractable, without status epilepticus: Secondary | ICD-10-CM | POA: Diagnosis not present

## 2022-09-18 DIAGNOSIS — Z87828 Personal history of other (healed) physical injury and trauma: Secondary | ICD-10-CM | POA: Diagnosis not present

## 2022-09-18 MED ORDER — OXCARBAZEPINE 150 MG PO TABS: 150.0000 mg | ORAL_TABLET | Freq: Two times a day (BID) | ORAL | 1 refills | Status: DC

## 2022-09-18 NOTE — Progress Notes (Signed)
PATIENT: Sabrina Guerrero DOB: [birth year]  REASON FOR VISIT: Follow up for seizures HISTORY FROM: Patient, daughter Sabrina Guerrero PRIMARY NEUROLOGIST: Dr. Teresa Guerrero  HISTORY OF PRESENT ILLNESS: Today 09/18/22 Labs January 2024 showed sodium level 132, Trileptal level 7, Vimpat level 13.6. Here with daughter. If overusing right arm may have sharp pain in right arm, rest goes away, not weekly. Taking Vimpat 150 mg BID, Trileptal 150 mg BID. In the past her seizures have been staring events. In March she had gone back to Trileptal 300 mg BID, her daughter helps now with medications, she stopped driving. Does have some memory issues, trouble with short term.  Had neuropsychological evaluation with Dr. Kieth Guerrero in 2022, did not feel there was a neurodegenerative disorder.  Felt memory issues were related to seizure disorder.  03/01/22 SS: Here alone, she drove here. Doing well. No seizures. Medications listed are: Trileptal 150 mg BID, Vimpat 150 mg BID. Back in Sept 2022 Dr. Anne Guerrero has increased her Trileptal to 300 mg BID due to seizures.  I called the pharmacy, the last time she refilled Trileptal at the 300 mg strength was in March 2023.  Since then she has been on 150 mg twice a day.   08/29/21 Dr. Teresa Guerrero: Patient presents today for follow-up, she is accompanied by her daughter.  Last visit was in January and since then she denies any additional seizures or seizure-like activity.  She is currently on Vimpat 100 mg twice daily and Trileptal 300 mg twice daily, denies any side effect of the medication and reported tolerance.  INTERVAL HISTORY 02/28/21:  Sabrina Guerrero here today for follow-up with history of seizures.  Had about 3 seizures in 2022, at last visit with Dr. Anne Guerrero, Trileptal was increased to 300 mg twice daily. EEG was normal in November 2022.  Vimpat was added in November 2022 due to breakthrough seizures. 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 December 2021 had brain surgery for SDH. She lives with her daughter. Walks 20 minutes almost daily. No falls.   HISTORY  10/06/20 Dr. Anne Guerrero: [name] is a 77 year old right-handed black female with a history of diabetes and a history of seizures.  The patient has been well controlled on a very low-dose of Trileptal taking 150 mg twice daily for a number of years.  Within the last year, she did have a fall and developed bilateral subdural hematoma requiring surgery.  She had fallen in October 2021, presented to the hospital on 01 February 2020 with gait instability and confusion.  She lives with her daughter who has noted that over the last several months she has sustained 3 witnessed seizure events.  The patient would not remember whether or not she had a seizure if she were alone.  The patient will have a 10 to 15-minute episode of staring off and associated confusion with gradual return to normal baseline.  The patient has developed a memory disorder, she has been set up for formal neuropsychological testing in November 2022.  She is no longer operating a motor vehicle.  She does report occasional episodes of soreness in the head that may come and go, she has had 4-5 such episodes since her surgery for the subdural hematoma.  She returns to this office for an evaluation.  REVIEW  OF SYSTEMS: Out of a complete 14 system review of symptoms, the patient complains only of the following symptoms, and all other reviewed systems are negative.  See HPI  ALLERGIES: No Known Allergies  HOME MEDICATIONS: Outpatient Medications Prior to Visit  Medication Sig Dispense Refill   ACCU-CHEK GUIDE test strip USE TO TEST DAILY 100 strip 2   blood glucose meter kit and supplies KIT  Dispense based on patient and insurance preference. Check glucose daily, E11.65 1 each 0   Lacosamide 150 MG TABS TAKE ONE TABLET BY MOUTH BEFORE BREAKFAST and TAKE ONE TABLET BY MOUTH EVERY EVENING 180 tablet 3   omeprazole (PRILOSEC) 40 MG capsule TAKE ONE CAPSULE BY MOUTH AT BREAKFAST AND AT BEDTIME 180 capsule 1   rosuvastatin (CRESTOR) 20 MG tablet TAKE 1 TABLET BY MOUTH EVERY DAY 90 tablet 1   OXcarbazepine (TRILEPTAL) 150 MG tablet Take 1 tablet (150 mg total) by mouth 2 (two) times daily. 180 tablet 1   thiamine 100 MG tablet Take 1 tablet by mouth every Monday, Wednesday, and Friday. (Patient not taking: Reported on 07/13/2022)     No facility-administered medications prior to visit.    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, requiring surgery   Memory deficit 05/20/2014   Partial symptomatic epilepsy with complex partial seizures, not intractable, without status epilepticus (HCC) 05/20/2014   most recent 09/10/20   Ventral hernia     PAST SURGICAL HISTORY: Past Surgical History:  Procedure Laterality Date   BRAIN SURGERY  02/03/2020   COLONOSCOPY WITH PROPOFOL N/A 12/08/2020   Procedure: COLONOSCOPY WITH PROPOFOL;  Surgeon: Sabrina Mood, MD;  Location: [facility] ENDOSCOPY;  Service: Gastroenterology;  Laterality: N/A;   ESOPHAGOGASTRODUODENOSCOPY N/A 12/08/2020   Procedure: ESOPHAGOGASTRODUODENOSCOPY (EGD);  Surgeon: Sabrina Mood, MD;  Location: [facility] ENDOSCOPY;  Service: Gastroenterology;  Laterality: N/A;   HERNIA REPAIR     SUBDURAL HEMATOMA EVACUATION VIA CRANIOTOMY  01/2020   TUBAL LIGATION     VENTRAL HERNIA REPAIR      FAMILY HISTORY: Family History  Problem Relation Age of Onset   Arthritis Mother    Cancer Brother        lung   Colon cancer Neg Hx    Esophageal cancer Neg Hx    Stomach cancer  Neg Hx    Liver disease Neg Hx     SOCIAL HISTORY: Social History   Socioeconomic History   Marital status: Divorced    Spouse name: Not on file   Number of children: 3   Years of education: 18 years   Highest education level: Not on file  Occupational History   Occupation: unemployed  Tobacco Use   Smoking status: Never   Smokeless tobacco: Never  Vaping Use   Vaping status: Never Used  Substance and Sexual Activity   Alcohol use: Never   Drug use: Never   Sexual activity: Never  Other Topics Concern   Not on file  Social History Narrative   10/06/20 lives with dgtr   Patient is right handed.   Patient does not drink caffeine.   Social Determinants of Health   Financial Resource Strain: Low Risk  (07/13/2022)   Overall Financial Resource Strain (CARDIA)    Difficulty of Paying Living Expenses: Not hard at all  Food Insecurity: No Food Insecurity (07/13/2022)   Hunger Vital  Sign    Worried About Programme researcher, broadcasting/film/video in the Last Year: Never true    Ran Out of Food in the Last Year: Never true  Transportation Needs: No Transportation Needs (07/13/2022)   PRAPARE - Administrator, Civil Service (Medical): No    Lack of Transportation (Non-Medical): No  Physical Activity: Sufficiently Active (07/13/2022)   Exercise Vital Sign    Days of Exercise per Week: 7 days    Minutes of Exercise per Session: 30 min  Stress: No Stress Concern Present (07/13/2022)   [employer] of Occupational Health - Occupational Stress Questionnaire    Feeling of Stress : Not at all  Social Connections: Moderately Isolated (07/11/2021)   Social Connection and Isolation Panel [NHANES]    Frequency of Communication with Friends and Family: Three times a week    Frequency of Social Gatherings with Friends and Family: Three times a week    Attends Religious Services: More than 4 times per year    Active Member of Clubs or Organizations: No    Attends Banker Meetings: Never     Marital Status: Divorced  Catering manager Violence: Not At Risk (07/11/2021)   Humiliation, Afraid, Rape, and Kick questionnaire    Fear of Current or Ex-Partner: No    Emotionally Abused: No    Physically Abused: No    Sexually Abused: No   PHYSICAL EXAM  Vitals:   09/18/22 0813  BP: 113/70  Pulse: 71  Weight: 139 lb (63 kg)  Height: 5\' 2"  (1.575 m)   Body mass index is 25.42 kg/m.  Generalized: Well developed, in no acute distress   Neurological examination  Mentation: Alert oriented to time, place, history taking. Follows all commands speech and language fluent.  Cranial nerve II-XII: Pupils were equal round reactive to light. Extraocular movements were full, visual field were full on confrontational test. Facial sensation and strength were normal. Head turning and shoulder shrug  were normal and symmetric. Motor: Good strength all extremities Sensory: Sensory testing is intact to soft touch on all 4 extremities. No evidence of extinction is noted.  Coordination: Cerebellar testing reveals good finger-nose-finger and heel-to-shin bilaterally.  Gait and station: Gait is normal, independent  DIAGNOSTIC DATA (LABS, IMAGING, TESTING) - I reviewed patient records, labs, notes, testing and imaging myself where available.  Lab Results  Component Value Date   WBC 4.3 06/12/2022   HGB 12.4 06/12/2022   HCT 37.3 06/12/2022   MCV 97.3 06/12/2022   PLT 281.0 06/12/2022      Component Value Date/Time   NA 129 (L) 06/12/2022 1025   NA 132 (L) 03/01/2022 1127   K 4.3 06/12/2022 1025   CL 95 (L) 06/12/2022 1025   CO2 26 06/12/2022 1025   GLUCOSE 104 (H) 06/12/2022 1025   BUN 7 06/12/2022 1025   BUN 8 03/01/2022 1127   CREATININE 0.61 06/12/2022 1025   CALCIUM 9.3 06/12/2022 1025   PROT 7.3 03/01/2022 1127   ALBUMIN 3.8 06/12/2022 1025   ALBUMIN 4.2 03/01/2022 1127   AST 14 03/01/2022 1127   ALT 6 03/01/2022 1127   ALKPHOS 77 03/01/2022 1127   BILITOT 0.4 03/01/2022 1127    GFRNONAA >60 09/24/2021 1907   GFRAA 81 04/01/2019 1407   Lab Results  Component Value Date   CHOL 213 (H) 04/20/2021   HDL 45.80 04/20/2021   LDLCALC 148 (H) 04/20/2021   LDLDIRECT 141.0 06/12/2022   TRIG 98.0 04/20/2021   CHOLHDL 5  04/20/2021   Lab Results  Component Value Date   HGBA1C 6.1 06/12/2022   Lab Results  Component Value Date   VITAMINB12 510 10/06/2020   Lab Results  Component Value Date   TSH 0.51 06/12/2022   ASSESSMENT AND PLAN 77 y.o. year old female  has a past medical history of Diabetes mellitus without complication (HCC), Gastroesophageal reflux disease, History of subdural hematoma (post traumatic) (03/23/2020), Memory deficit (05/20/2014), Partial symptomatic epilepsy with complex partial seizures, not intractable, without status epilepticus (HCC) (05/20/2014), and Ventral hernia. here with:  1.  History of seizures, last was in November 2022 2.  History of subdural hematoma  -No recent seizures, check routine labs, specific interest in sodium level, chronic hyponatremia. She didn't take medications yet, so labs should be true trough -For now, continue Trileptal 150 mg twice daily, Vimpat 150 mg twice daily -Daughter is now assisting with medication management, she is no longer driving -Follow-up in 6 months or sooner if needed with Dr. Teresa Guerrero to alternate visits with me  Margie Ege, Edrick Oh, DNP  [facility] [address] [place], [place] [address] [phone]

## 2022-09-18 NOTE — Patient Instructions (Addendum)
Great to see you today.  We will continue current medications.  Check labs.  Follow-up in 6 months with Dr. Teresa Coombs.

## 2022-09-20 ENCOUNTER — Encounter (INDEPENDENT_AMBULATORY_CARE_PROVIDER_SITE_OTHER): Payer: Self-pay

## 2022-10-15 ENCOUNTER — Ambulatory Visit: Payer: 59 | Admitting: Nurse Practitioner

## 2022-10-16 ENCOUNTER — Ambulatory Visit (INDEPENDENT_AMBULATORY_CARE_PROVIDER_SITE_OTHER): Payer: 59 | Admitting: Nurse Practitioner

## 2022-10-16 VITALS — BP 118/66 | HR 86 | Temp 97.6°F | Ht 62.0 in | Wt 146.0 lb

## 2022-10-16 DIAGNOSIS — R600 Localized edema: Secondary | ICD-10-CM

## 2022-10-16 DIAGNOSIS — E1165 Type 2 diabetes mellitus with hyperglycemia: Secondary | ICD-10-CM

## 2022-10-16 DIAGNOSIS — R011 Cardiac murmur, unspecified: Secondary | ICD-10-CM

## 2022-10-16 HISTORY — DX: Cardiac murmur, unspecified: R01.1

## 2022-10-16 LAB — POCT GLYCOSYLATED HEMOGLOBIN (HGB A1C): Hemoglobin A1C: 5.9 % — AB (ref 4.0–5.6)

## 2022-10-16 NOTE — Patient Instructions (Signed)
Go to lab for urine collection. Maintain DASH diet  DASH Eating Plan DASH stands for Dietary Approaches to Stop Hypertension. The DASH eating plan is a healthy eating plan that has been shown to: Lower high blood pressure (hypertension). Reduce your risk for type 2 diabetes, heart disease, and stroke. Help with weight loss. What are tips for following this plan? Reading food labels Check food labels for the amount of salt (sodium) per serving. Choose foods with less than 5 percent of the Daily Value (DV) of sodium. In general, foods with less than 300 milligrams (mg) of sodium per serving fit into this eating plan. To find whole grains, look for the word "whole" as the first word in the ingredient list. Shopping Buy products labeled as "low-sodium" or "no salt added." Buy fresh foods. Avoid canned foods and pre-made or frozen meals. Cooking Try not to add salt when you cook. Use salt-free seasonings or herbs instead of table salt or sea salt. Check with your health care provider or pharmacist before using salt substitutes. Do not fry foods. Cook foods in healthy ways, such as baking, boiling, grilling, roasting, or broiling. Cook using oils that are good for your heart. These include olive, canola, avocado, soybean, and sunflower oil. Meal planning  Eat a balanced diet. This should include: 4 or more servings of fruits and 4 or more servings of vegetables each day. Try to fill half of your plate with fruits and vegetables. 6-8 servings of whole grains each day. 6 or less servings of lean meat, poultry, or fish each day. 1 oz is 1 serving. A 3 oz (85 g) serving of meat is about the same size as the palm of your hand. One egg is 1 oz (28 g). 2-3 servings of low-fat dairy each day. One serving is 1 cup (237 mL). 1 serving of nuts, seeds, or beans 5 times each week. 2-3 servings of heart-healthy fats. Healthy fats called omega-3 fatty acids are found in foods such as walnuts, flaxseeds,  fortified milks, and eggs. These fats are also found in cold-water fish, such as sardines, salmon, and mackerel. Limit how much you eat of: Canned or prepackaged foods. Food that is high in trans fat, such as fried foods. Food that is high in saturated fat, such as fatty meat. Desserts and other sweets, sugary drinks, and other foods with added sugar. Full-fat dairy products. Do not salt foods before eating. Do not eat more than 4 egg yolks a week. Try to eat at least 2 vegetarian meals a week. Eat more home-cooked food and less restaurant, buffet, and fast food. Lifestyle When eating at a restaurant, ask if your food can be made with less salt or no salt. If you drink alcohol: Limit how much you have to: 0-1 drink a day if you are female. 0-2 drinks a day if you are female. Know how much alcohol is in your drink. In the [place] one drink is one 12 oz bottle of beer (355 mL), one 5 oz glass of wine (148 mL), or one 1 oz glass of hard liquor (44 mL). General information Avoid eating more than 2,300 mg of salt a day. If you have hypertension, you may need to reduce your sodium intake to 1,500 mg a day. Work with your provider to stay at a healthy body weight or lose weight. Ask what the best weight range is for you. On most days of the week, get at least 30 minutes of exercise that causes your  heart to beat faster. This may include walking, swimming, or biking. Work with your provider or dietitian to adjust your eating plan to meet your specific calorie needs. What foods should I eat? Fruits All fresh, dried, or frozen fruit. Canned fruits that are in their natural juice and do not have sugar added to them. Vegetables Fresh or frozen vegetables that are raw, steamed, roasted, or grilled. Low-sodium or reduced-sodium tomato and vegetable juice. Low-sodium or reduced-sodium tomato sauce and tomato paste. Low-sodium or reduced-sodium canned vegetables. Grains Whole-grain or whole-wheat bread.  Whole-grain or whole-wheat pasta. Brown rice. Orpah Cobb. Bulgur. Whole-grain and low-sodium cereals. Pita bread. Low-fat, low-sodium crackers. Whole-wheat flour tortillas. Meats and other proteins Skinless chicken or [place]. Ground chicken or [place]. Pork with fat trimmed off. Fish and seafood. Egg whites. Dried beans, peas, or lentils. Unsalted nuts, nut butters, and seeds. Unsalted canned beans. Lean cuts of beef with fat trimmed off. Low-sodium, lean precooked or cured meat, such as sausages or meat loaves. Dairy Low-fat (1%) or fat-free (skim) milk. Reduced-fat, low-fat, or fat-free cheeses. Nonfat, low-sodium ricotta or cottage cheese. Low-fat or nonfat yogurt. Low-fat, low-sodium cheese. Fats and oils Soft margarine without trans fats. Vegetable oil. Reduced-fat, low-fat, or light mayonnaise and salad dressings (reduced-sodium). Canola, safflower, olive, avocado, soybean, and sunflower oils. Avocado. Seasonings and condiments Herbs. Spices. Seasoning mixes without salt. Other foods Unsalted popcorn and pretzels. Fat-free sweets. The items listed above may not be all the foods and drinks you can have. Talk to a dietitian to learn more. What foods should I avoid? Fruits Canned fruit in a light or heavy syrup. Fried fruit. Fruit in cream or butter sauce. Vegetables Creamed or fried vegetables. Vegetables in a cheese sauce. Regular canned vegetables that are not marked as low-sodium or reduced-sodium. Regular canned tomato sauce and paste that are not marked as low-sodium or reduced-sodium. Regular tomato and vegetable juices that are not marked as low-sodium or reduced-sodium. Rosita Fire. Olives. Grains Baked goods made with fat, such as croissants, muffins, or some breads. Dry pasta or rice meal packs. Meats and other proteins Fatty cuts of meat. Ribs. Fried meat. Tomasa Blase. Bologna, salami, and other precooked or cured meats, such as sausages or meat loaves, that are not lean and low in  sodium. Fat from the back of a pig (fatback). Bratwurst. Salted nuts and seeds. Canned beans with added salt. Canned or smoked fish. Whole eggs or egg yolks. Chicken or [place] with skin. Dairy Whole or 2% milk, cream, and half-and-half. Whole or full-fat cream cheese. Whole-fat or sweetened yogurt. Full-fat cheese. Nondairy creamers. Whipped toppings. Processed cheese and cheese spreads. Fats and oils Butter. Stick margarine. Lard. Shortening. Ghee. Bacon fat. Tropical oils, such as coconut, palm kernel, or palm oil. Seasonings and condiments Onion salt, garlic salt, seasoned salt, table salt, and sea salt. Worcestershire sauce. Tartar sauce. Barbecue sauce. Teriyaki sauce. Soy sauce, including reduced-sodium soy sauce. Steak sauce. Canned and packaged gravies. Fish sauce. Oyster sauce. Cocktail sauce. Store-bought horseradish. Ketchup. Mustard. Meat flavorings and tenderizers. Bouillon cubes. Hot sauces. Pre-made or packaged marinades. Pre-made or packaged taco seasonings. Relishes. Regular salad dressings. Other foods Salted popcorn and pretzels. The items listed above may not be all the foods and drinks you should avoid. Talk to a dietitian to learn more. Where to find more information National Heart, Lung, and Blood Institute (NHLBI): BuffaloDryCleaner.gl American Heart Association (AHA): heart.org Academy of Nutrition and Dietetics: eatright.org National Kidney Foundation (NKF): kidney.org This information is not intended to replace advice  given to you by your health care provider. Make sure you discuss any questions you have with your health care provider. Document Revised: 02/08/2022 Document Reviewed: 02/08/2022 Elsevier Patient Education  2024 [employer].

## 2022-10-16 NOTE — Assessment & Plan Note (Addendum)
Soft diastolic murmur. Not noted in past. Denies any SOB or palpitation or CP. New onset LE edema x 2weeks, no pain, admits to high salt diet. Edema improves with elevation.  Ordered echocardiogram Advised to maintain DASH diet

## 2022-10-16 NOTE — Progress Notes (Signed)
Established Patient Visit  Patient: Sabrina Guerrero   DOB: Feb 16, 1945   77 y.o. Female  MRN: [id number] Visit Date: 10/16/2022  Subjective:    Chief Complaint  Patient presents with   Follow-up   Leg Swelling    Bilateral legs/feet last week    Accompanied by daughter.  HPI Type 2 diabetes mellitus with hyperglycemia, without long-term current use of insulin (HCC) Controlled DIABETES without medication HgbA1c at 5.9% today normal foot exam F/up in 6months  Murmur, cardiac Soft diastolic murmur. Not noted in past. Denies any SOB or palpitation or CP. New onset LE edema x 2weeks, no pain, admits to high salt diet. Edema improves with elevation.  Ordered echocardiogram  BP Readings from Last 3 Encounters:  10/16/22 118/66  09/18/22 113/70  07/13/22 108/60    Wt Readings from Last 3 Encounters:  10/16/22 146 lb (66.2 kg)  09/18/22 139 lb (63 kg)  07/13/22 141 lb 12.8 oz (64.3 kg)    Reviewed medical, surgical, and social history today  Medications: Outpatient Medications Prior to Visit  Medication Sig   ACCU-CHEK GUIDE test strip USE TO TEST DAILY   blood glucose meter kit and supplies KIT Dispense based on patient and insurance preference. Check glucose daily, E11.65   Lacosamide 150 MG TABS TAKE ONE TABLET BY MOUTH BEFORE BREAKFAST and TAKE ONE TABLET BY MOUTH EVERY EVENING   omeprazole (PRILOSEC) 40 MG capsule TAKE ONE CAPSULE BY MOUTH AT BREAKFAST AND AT BEDTIME   OXcarbazepine (TRILEPTAL) 150 MG tablet Take 1 tablet (150 mg total) by mouth 2 (two) times daily.   rosuvastatin (CRESTOR) 20 MG tablet TAKE 1 TABLET BY MOUTH EVERY DAY   No facility-administered medications prior to visit.   Reviewed past medical and social history.   ROS per HPI above      Objective:  BP 118/66   Pulse 86   Temp 97.6 F (36.4 C) (Temporal)   Ht 5\' 2"  (1.575 m)   Wt 146 lb (66.2 kg)   SpO2 99%   BMI 26.70 kg/m      Physical Exam Vitals reviewed.   Cardiovascular:     Rate and Rhythm: Normal rate and regular rhythm.     Pulses: Normal pulses.     Heart sounds: Murmur heard.  Pulmonary:     Effort: Pulmonary effort is normal.     Breath sounds: Normal breath sounds.  Musculoskeletal:     Right lower leg: No edema.     Left lower leg: No edema.  Neurological:     Mental Status: She is alert and oriented to person, place, and time.     Results for orders placed or performed in visit on 10/16/22  POCT glycosylated hemoglobin (Hb A1C)  Result Value Ref Range   Hemoglobin A1C 5.9 (A) 4.0 - 5.6 %   HbA1c POC (<> result, manual entry)     HbA1c, POC (prediabetic range)     HbA1c, POC (controlled diabetic range)        Assessment & Plan:    Problem List Items Addressed This Visit     Murmur, cardiac    Soft diastolic murmur. Not noted in past. Denies any SOB or palpitation or CP. New onset LE edema x 2weeks, no pain, admits to high salt diet. Edema improves with elevation.  Ordered echocardiogram      Relevant Orders   ECHOCARDIOGRAM COMPLETE   Type 2 diabetes  mellitus with hyperglycemia, without long-term current use of insulin (HCC) - Primary    Controlled DIABETES without medication HgbA1c at 5.9% today normal foot exam F/up in 6months      Relevant Orders   POCT glycosylated hemoglobin (Hb A1C) (Completed)   Microalbumin / creatinine urine ratio   Other Visit Diagnoses     Bilateral leg edema       Relevant Orders   ECHOCARDIOGRAM COMPLETE      Return in about 6 months (around 04/15/2023) for HTN, DM, hyperlipidemia (fasting).     Alysia Penna, NP

## 2022-10-16 NOTE — Assessment & Plan Note (Signed)
Controlled DIABETES without medication HgbA1c at 5.9% today normal foot exam F/up in 6months

## 2022-10-17 LAB — MICROALBUMIN / CREATININE URINE RATIO
Creatinine,U: 73.6 mg/dL
Microalb Creat Ratio: 1.1 mg/g (ref 0.0–30.0)
Microalb, Ur: 0.8 mg/dL (ref 0.0–1.9)

## 2022-10-23 DIAGNOSIS — N3000 Acute cystitis without hematuria: Secondary | ICD-10-CM | POA: Diagnosis not present

## 2022-10-24 ENCOUNTER — Encounter: Payer: Self-pay | Admitting: Nurse Practitioner

## 2022-10-24 ENCOUNTER — Ambulatory Visit (HOSPITAL_COMMUNITY): Payer: 59 | Attending: Nurse Practitioner

## 2022-10-24 DIAGNOSIS — R011 Cardiac murmur, unspecified: Secondary | ICD-10-CM | POA: Diagnosis not present

## 2022-10-24 DIAGNOSIS — R600 Localized edema: Secondary | ICD-10-CM | POA: Diagnosis not present

## 2022-10-24 LAB — ECHOCARDIOGRAM COMPLETE
Area-P 1/2: 2.99 cm2
S' Lateral: 2.4 cm

## 2022-10-29 ENCOUNTER — Encounter: Payer: Self-pay | Admitting: Nurse Practitioner

## 2022-11-06 DIAGNOSIS — R82998 Other abnormal findings in urine: Secondary | ICD-10-CM | POA: Diagnosis not present

## 2022-11-15 ENCOUNTER — Encounter: Payer: Self-pay | Admitting: Nurse Practitioner

## 2022-12-06 ENCOUNTER — Telehealth: Payer: Self-pay | Admitting: Nurse Practitioner

## 2022-12-06 DIAGNOSIS — K219 Gastro-esophageal reflux disease without esophagitis: Secondary | ICD-10-CM

## 2022-12-06 MED ORDER — OMEPRAZOLE 40 MG PO CPDR: 40.0000 mg | DELAYED_RELEASE_CAPSULE | Freq: Every day | ORAL | 1 refills | Status: DC

## 2022-12-06 NOTE — Telephone Encounter (Signed)
Pts daughter called requesting that all her meds be sent to   [id number] [place], [place] - [address] AT [facility] [address] [place] [address] Phone: [phone]  Fax: [phone]    As [facility] has closed  Refill request  omeprazole (PRILOSEC) 40 MG capsule [[id number]]  She has 0 left

## 2022-12-06 NOTE — Telephone Encounter (Signed)
[facility] to [facility] and lft detailed VM. Dm/cma

## 2022-12-24 ENCOUNTER — Other Ambulatory Visit: Payer: Self-pay | Admitting: Neurology

## 2022-12-25 ENCOUNTER — Encounter: Payer: Self-pay | Admitting: Neurology

## 2022-12-31 ENCOUNTER — Other Ambulatory Visit: Payer: Self-pay | Admitting: Neurology

## 2022-12-31 MED ORDER — LACOSAMIDE 150 MG PO TABS: 1.0000 | ORAL_TABLET | Freq: Two times a day (BID) | ORAL | 1 refills | Status: DC

## 2022-12-31 NOTE — Telephone Encounter (Signed)
Requested Prescriptions   Pending Prescriptions Disp Refills   Lacosamide 150 MG TABS [Pharmacy Med Name: LACOSAMIDE 150 MG TABLET] 180 tablet     Sig: TAKE 1 TABLET BY MOUTH TWICE A DAY   Last seen 09/18/22 Next appt 03/26/23  Dispenses   Dispensed Days Supply Quantity Provider Pharmacy  lacosamide 150 mg tablet 09/18/2022 30 60 each Windell Norfolk, MD [facility]  lacosamide 150 mg tablet 08/16/2022 30 60 each Windell Norfolk, MD [facility]  lacosamide 150 mg tablet 07/17/2022 30 60 each Windell Norfolk, MD [facility]  lacosamide 150 mg tablet 06/20/2022 30 60 each Windell Norfolk, MD [facility]  lacosamide 150 mg tablet 05/16/2022 30 60 each Windell Norfolk, MD [facility]  lacosamide 150 mg tablet 04/19/2022 30 60 each Windell Norfolk, MD [facility]  lacosamide 150 mg tablet 03/22/2022 30 60 each Windell Norfolk, MD [facility]  lacosamide 150 mg tablet 02/21/2022 30 60 each Windell Norfolk, MD [facility]  lacosamide 150 mg tablet 01/24/2022 30 60 each Windell Norfolk, MD [facility]Marland KitchenMarland Kitchen

## 2023-01-29 ENCOUNTER — Other Ambulatory Visit: Payer: Self-pay | Admitting: Nurse Practitioner

## 2023-01-29 DIAGNOSIS — E1169 Type 2 diabetes mellitus with other specified complication: Secondary | ICD-10-CM

## 2023-03-08 ENCOUNTER — Ambulatory Visit (INDEPENDENT_AMBULATORY_CARE_PROVIDER_SITE_OTHER): Payer: 59 | Admitting: Nurse Practitioner

## 2023-03-08 VITALS — BP 135/72 | HR 90 | Temp 98.5°F | Resp 18 | Wt 121.2 lb

## 2023-03-08 DIAGNOSIS — J0140 Acute pansinusitis, unspecified: Secondary | ICD-10-CM

## 2023-03-08 DIAGNOSIS — R634 Abnormal weight loss: Secondary | ICD-10-CM | POA: Diagnosis not present

## 2023-03-08 MED ORDER — AMOXICILLIN-POT CLAVULANATE 875-125 MG PO TABS: 1.0000 | ORAL_TABLET | Freq: Two times a day (BID) | ORAL | 0 refills | Status: DC

## 2023-03-08 MED ORDER — BENZONATATE 100 MG PO CAPS: 100.0000 mg | ORAL_CAPSULE | Freq: Three times a day (TID) | ORAL | 0 refills | Status: DC | PRN

## 2023-03-08 MED ORDER — GUAIFENESIN ER 600 MG PO TB12: 600.0000 mg | ORAL_TABLET | Freq: Every day | ORAL | Status: DC

## 2023-03-08 NOTE — Assessment & Plan Note (Signed)
25Ibs in last 3months. Daughter and granddaughter report decreased food intake. Sabrina Guerrero states she forgets to eat and does not get hungry. She denies any ABDOMEN pain or nausea or dysphagia or constipation or diarrhea or early satiety. Wt Readings from Last 3 Encounters:  03/08/23 121 lb 3.2 oz (55 kg)  10/16/22 146 lb (66.2 kg)  09/18/22 139 lb (63 kg)    Advised daughter and granddaughter to offer food and/or snack every 1-2hrs, and add ensure max protein 2x/day. F/up in 53month

## 2023-03-08 NOTE — Progress Notes (Signed)
Acute Office Visit  Subjective:    Patient ID: Sabrina Guerrero, female    DOB: 1945/09/20, 78 y.o.   MRN: [id number]  Chief Complaint  Patient presents with   Acute Visit    PT C/O of cough that comes and go for 1 week with sneezing symptoms; eye exam is due and was mentioned; she will schedule appointment.    HPI Accompanied by Granddaughter. Daughter on phone during visit. Patient is in today for cough and post nasal drainage x 3weeks. Daughter reports COVID infection 3weeks ago. Did not take paxlovid. She reports purulent nasal and post nasal drainage. Cough is worse at night. No fever, no chest pain, no SOB, no night sweats. No OVER THE COUNTER med used  Weight loss, abnormal 25Ibs in last 3months. Daughter and granddaughter report decreased food intake. Sabrina Guerrero states she forgets to eat and does not get hungry. She denies any ABDOMEN pain or nausea or dysphagia or constipation or diarrhea or early satiety. Wt Readings from Last 3 Encounters:  03/08/23 121 lb 3.2 oz (55 kg)  10/16/22 146 lb (66.2 kg)  09/18/22 139 lb (63 kg)    Advised daughter and granddaughter to offer food and/or snack every 1-2hrs, and add ensure max protein 2x/day. F/up in 4month   Outpatient Medications Prior to Visit  Medication Sig   ACCU-CHEK GUIDE test strip USE TO TEST DAILY   blood glucose meter kit and supplies KIT Dispense based on patient and insurance preference. Check glucose daily, E11.65   Lacosamide 150 MG TABS Take 1 tablet (150 mg total) by mouth 2 (two) times daily.   Lacosamide 150 MG TABS TAKE 1 TABLET BY MOUTH TWICE A DAY   omeprazole (PRILOSEC) 40 MG capsule Take 1 capsule (40 mg total) by mouth daily.   OXcarbazepine (TRILEPTAL) 150 MG tablet TAKE 1 TABLET BY MOUTH TWICE A DAY   rosuvastatin (CRESTOR) 20 MG tablet TAKE 1 TABLET BY MOUTH EVERY DAY   No facility-administered medications prior to visit.    Reviewed past medical and social history.  Review of Systems Per HPI      Objective:    Physical Exam Vitals and nursing note reviewed.  Cardiovascular:     Rate and Rhythm: Normal rate and regular rhythm.     Pulses: Normal pulses.     Heart sounds: Normal heart sounds.  Pulmonary:     Effort: Pulmonary effort is normal.     Breath sounds: Normal breath sounds.  Abdominal:     General: Bowel sounds are normal. There is no distension.     Palpations: Abdomen is soft.     Tenderness: There is no abdominal tenderness. There is no guarding.  Neurological:     Mental Status: She is alert and oriented to person, place, and time.    BP 135/72 (BP Location: Right Arm, Patient Position: Sitting, Cuff Size: Normal)   Pulse 90   Temp 98.5 F (36.9 C) (Oral)   Resp 18   Wt 121 lb 3.2 oz (55 kg)   SpO2 98%   BMI 22.17 kg/m    No results found for any visits on 03/08/23.     Assessment & Plan:   Problem List Items Addressed This Visit     Weight loss, abnormal   25Ibs in last 3months. Daughter and granddaughter report decreased food intake. Sabrina Guerrero states she forgets to eat and does not get hungry. She denies any ABDOMEN pain or nausea or dysphagia or constipation or diarrhea  or early satiety. Wt Readings from Last 3 Encounters:  03/08/23 121 lb 3.2 oz (55 kg)  10/16/22 146 lb (66.2 kg)  09/18/22 139 lb (63 kg)    Advised daughter and granddaughter to offer food and/or snack every 1-2hrs, and add ensure max protein 2x/day. F/up in 87month      Other Visit Diagnoses       Acute non-recurrent pansinusitis    -  Primary   Relevant Medications   guaiFENesin (MUCINEX) 600 MG 12 hr tablet   benzonatate (TESSALON) 100 MG capsule   amoxicillin-clavulanate (AUGMENTIN) 875-125 MG tablet      Meds ordered this encounter  Medications   guaiFENesin (MUCINEX) 600 MG 12 hr tablet    Sig: Take 1 tablet (600 mg total) by mouth daily at 12 noon.    Supervising Provider:   Nadene Rubins ALFRED [[id number]]   benzonatate (TESSALON) 100 MG capsule    Sig:  Take 1 capsule (100 mg total) by mouth 3 (three) times daily as needed.    Dispense:  21 capsule    Refill:  0    Supervising Provider:   Nadene Rubins ALFRED [[id number]]   amoxicillin-clavulanate (AUGMENTIN) 875-125 MG tablet    Sig: Take 1 tablet by mouth 2 (two) times daily.    Dispense:  20 tablet    Refill:  0    Supervising Provider:   Mliss Sax [[id number]]   Return if symptoms worsen or fail to improve.    Alysia Penna, NP

## 2023-03-08 NOTE — Patient Instructions (Signed)
Maintain adequate oral hydration May use mucinex once a day and benzonatate every 8 hrs for cough Drink 2 bottles or ensure max protein daily Eat small frequent meals. Call office if not improvement in 10days

## 2023-03-26 ENCOUNTER — Ambulatory Visit: Payer: 59 | Admitting: Neurology

## 2023-03-26 ENCOUNTER — Encounter: Payer: Self-pay | Admitting: Neurology

## 2023-03-29 ENCOUNTER — Other Ambulatory Visit: Payer: Self-pay | Admitting: Neurology

## 2023-04-15 ENCOUNTER — Ambulatory Visit (INDEPENDENT_AMBULATORY_CARE_PROVIDER_SITE_OTHER): Payer: 59 | Admitting: Nurse Practitioner

## 2023-04-15 ENCOUNTER — Encounter: Payer: Self-pay | Admitting: Nurse Practitioner

## 2023-04-15 VITALS — BP 120/62 | HR 85 | Temp 96.1°F | Ht 62.0 in | Wt 121.0 lb

## 2023-04-15 DIAGNOSIS — R634 Abnormal weight loss: Secondary | ICD-10-CM | POA: Diagnosis not present

## 2023-04-15 DIAGNOSIS — E1165 Type 2 diabetes mellitus with hyperglycemia: Secondary | ICD-10-CM

## 2023-04-15 LAB — POCT GLYCOSYLATED HEMOGLOBIN (HGB A1C): Hemoglobin A1C: 5.7 % — AB (ref 4.0–5.6)

## 2023-04-15 NOTE — Assessment & Plan Note (Signed)
 Stable weight Reports daily. Wt Readings from Last 3 Encounters:  04/15/23 121 lb (54.9 kg)  03/08/23 121 lb 3.2 oz (55 kg)  10/16/22 146 lb (66.2 kg)    F/up in 3months Advised to drink at lease 1bottle of ensure daily

## 2023-04-15 NOTE — Assessment & Plan Note (Signed)
 Controlled DIABETES without medication HgbA1c at 5.7% today F/up in 3months

## 2023-04-15 NOTE — Progress Notes (Signed)
 Established Patient Visit  Patient: Sabrina Guerrero   DOB: 1945-11-01   78 y.o. Female  MRN: [id number] Visit Date: 04/15/2023  Subjective:    Chief Complaint  Patient presents with   Follow-up    HTN and diabetes,    HPI Accompanied by daughter.  Weight loss, abnormal Stable weight Reports daily. Wt Readings from Last 3 Encounters:  04/15/23 121 lb (54.9 kg)  03/08/23 121 lb 3.2 oz (55 kg)  10/16/22 146 lb (66.2 kg)    F/up in 3months Advised to drink at lease 1bottle of ensure daily  Type 2 diabetes mellitus with hyperglycemia, without long-term current use of insulin (HCC) Controlled DIABETES without medication HgbA1c at 5.7% today F/up in 3months  Wt Readings from Last 3 Encounters:  04/15/23 121 lb (54.9 kg)  03/08/23 121 lb 3.2 oz (55 kg)  10/16/22 146 lb (66.2 kg)    BP Readings from Last 3 Encounters:  04/15/23 120/62  03/08/23 135/72  10/16/22 118/66    Reviewed medical, surgical, and social history today  Medications: Outpatient Medications Prior to Visit  Medication Sig   ACCU-CHEK GUIDE test strip USE TO TEST DAILY   blood glucose meter kit and supplies KIT Dispense based on patient and insurance preference. Check glucose daily, E11.65   Lacosamide 150 MG TABS Take 1 tablet (150 mg total) by mouth 2 (two) times daily.   Lacosamide 150 MG TABS TAKE 1 TABLET BY MOUTH TWICE A DAY   omeprazole (PRILOSEC) 40 MG capsule Take 1 capsule (40 mg total) by mouth daily.   OXcarbazepine (TRILEPTAL) 150 MG tablet TAKE 1 TABLET BY MOUTH TWICE A DAY   rosuvastatin (CRESTOR) 20 MG tablet TAKE 1 TABLET BY MOUTH EVERY DAY   [DISCONTINUED] amoxicillin-clavulanate (AUGMENTIN) 875-125 MG tablet Take 1 tablet by mouth 2 (two) times daily. (Patient not taking: Reported on 04/15/2023)   [DISCONTINUED] benzonatate (TESSALON) 100 MG capsule Take 1 capsule (100 mg total) by mouth 3 (three) times daily as needed. (Patient not taking: Reported on 04/15/2023)    [DISCONTINUED] guaiFENesin (MUCINEX) 600 MG 12 hr tablet Take 1 tablet (600 mg total) by mouth daily at 12 noon. (Patient not taking: Reported on 04/15/2023)   No facility-administered medications prior to visit.   Reviewed past medical and social history.   ROS per HPI above      Objective:  BP 120/62 (BP Location: Right Arm, Patient Position: Sitting, Cuff Size: Normal)   Pulse 85   Temp (!) 96.1 F (35.6 C) (Temporal)   Ht 5\' 2"  (1.575 m)   Wt 121 lb (54.9 kg)   SpO2 99%   BMI 22.13 kg/m      Physical Exam Vitals and nursing note reviewed.  Cardiovascular:     Rate and Rhythm: Normal rate and regular rhythm.     Pulses: Normal pulses.     Heart sounds: Normal heart sounds.  Pulmonary:     Effort: Pulmonary effort is normal.     Breath sounds: Normal breath sounds.  Musculoskeletal:     Right lower leg: No edema.     Left lower leg: No edema.  Neurological:     Mental Status: She is alert and oriented to person, place, and time.     Results for orders placed or performed in visit on 04/15/23  POCT glycosylated hemoglobin (Hb A1C)  Result Value Ref Range   Hemoglobin A1C 5.7 (A) 4.0 -  5.6 %   HbA1c POC (<> result, manual entry)     HbA1c, POC (prediabetic range)     HbA1c, POC (controlled diabetic range)        Assessment & Plan:    Problem List Items Addressed This Visit     Type 2 diabetes mellitus with hyperglycemia, without long-term current use of insulin (HCC) - Primary   Controlled DIABETES without medication HgbA1c at 5.7% today F/up in 3months      Relevant Orders   POCT glycosylated hemoglobin (Hb A1C) (Completed)   Weight loss, abnormal   Stable weight Reports daily. Wt Readings from Last 3 Encounters:  04/15/23 121 lb (54.9 kg)  03/08/23 121 lb 3.2 oz (55 kg)  10/16/22 146 lb (66.2 kg)    F/up in 3months Advised to drink at lease 1bottle of ensure daily      Return in about 3 months (around 07/16/2023) for DM, hyperlipidemia,  abnormal weight loss (fasting).     Alysia Penna, NP

## 2023-04-15 NOTE — Patient Instructions (Signed)
 Preventing Consequences of Unhealthy Weight Loss Behaviors, Adult Reaching and maintaining a healthy weight is important for your overall health. A healthy weight will vary from person to person. It is natural to want to lose weight quickly, using whatever methods seem fastest. However, losing weight in a healthy way is not a quick process. Instead, aim for slow, steady weight loss by making small changes and setting achievable goals. How can unhealthy weight loss behaviors affect me? Using unhealthy behaviors to try to lose weight can cause: Tiredness (fatigue), low heart rate, and low blood pressure. Imbalances in your body. These may be imbalances in: Electrolytes. These are salts and minerals in your blood. Chemicals. These are needed so your body can work properly. Body fluids. Loss of fluids may lead to dehydration. Organ damage or organ failure, especially affecting the kidneys. Thin bones that break easily. Loneliness or relationship problems with your friends and family. Emotional problems, including depression and anxiety. Changing unhealthy weight loss behaviors through lifestyle changes will improve your overall health. Maintaining a healthy weight also lowers your risk of certain conditions, such as: Obesity. Heart disease, high cholesterol, and high blood pressure. Type 2 diabetes. Breathing and sleeping disorders. Stroke. Osteoarthritis. This affects your joints. Osteoporosis. This affects your bones. Some cancers. What can increase my risk? Certain views or feelings about yourself and certain habits can increase your risk of unhealthy weight loss behaviors. These include: Having depression and being overweight as an adult. Attempting weight loss as a child or teen. Using alcohol, drugs, or tobacco products. What actions can I take to prevent these behaviors? You can make certain lifestyle changes to help you lose weight in a healthy way. These include eating nutritious  foods and exercising regularly. Nutrition  Eat a variety of healthy foods, including fruits and vegetables, whole grains, lean proteins, and low-fat dairy products. Drink water instead of sugary drinks such as juice, soda, or sports drinks. Drink enough fluid to keep your urine pale yellow. Plan healthy, balanced meals. Work with a Data processing manager to make a healthy meal plan that works for you. Limit the following: Foods that are high in fat, salt (sodium), or sugar. These include candy, donuts, pizza, and fast foods. Fried or heavily processed foods. Lifestyle Avoid these unhealthy eating habits: Following a diet that restricts entire types of food. This may be a popular diet that promises extreme results in a short time. Skipping meals to save calories. Not eating anything for long periods of time (fasting). Restricting your calories to far fewer than the number that you need to lose or maintain a healthy weight. Taking laxative pills to make you have more frequent bowel movements. Taking medicines to make your body lose excess fluids (diuretics). Eating an excessive amount of food and then making yourself vomit. This is known as bingeing and purging. Do not use any products that contain nicotine or tobacco. These products include cigarettes, chewing tobacco, and vaping devices, such as e-cigarettes. If you need help quitting, ask your health care provider. Alcohol use Do not drink alcohol if: Your health care provider tells you not to drink. You are pregnant, may be pregnant, or are planning to become pregnant. If you drink alcohol: Limit how much you have to: 0-1 drink a day for women. 0-2 drinks a day for men. Know how much alcohol is in your drink. In the [place] one drink equals one 12 oz bottle of beer (355 mL), one 5 oz glass of wine (148 mL), or one 1  oz glass of hard liquor (44 mL). Activity  Avoid compulsively getting an extreme amount of exercise. Work with a Data processing manager to make a  healthy exercise program. Include different types of exercise in your exercise program, such as strengthening, aerobic, and flexibility exercises. To maintain your weight, get at least 150 minutes of moderate-intensity exercise each week. Moderate-intensity exercise could be brisk walking or biking. To lose a healthy amount of weight, get 60 minutes of moderate-intensity exercise each day. Find ways to reduce stress, such as regular exercise or meditation. Find a hobby or other activity that you enjoy to distract you from eating when you feel stressed or bored. Where to find support For more support, talk with: Your health care provider or dietitian. Ask about support groups. A mental health care provider. Family and friends. Where to find more information Learn more about how to prevent complications from unhealthy weight loss behaviors from: Centers for Disease Control and Prevention: FootballExhibition.com.br [employer] of Mental Health: http://www.maynard.net/ National Eating Disorders Association: www.nationaleatingdisorders.org Contact a health care provider if: You often feel very tired. You notice changes in your skin or your hair. You faint because of dehydration or too much exercise. You struggle to change your unhealthy weight loss behaviors on your own. Unhealthy weight loss behaviors are affecting your daily life or your relationships. You have signs or symptoms of an eating disorder. You have major weight changes in a short period of time. You feel guilty or ashamed about eating or exercising. Summary Using unhealthy eating behaviors to try to lose weight can cause a variety of physical and emotional problems that affect your overall health and well-being. Aim for slow, steady weight loss by choosing healthy foods, avoiding unhealthy eating habits, and exercising regularly. Contact your health care provider if you struggle to change your behaviors on your own or if you think that you may  have an eating disorder. This information is not intended to replace advice given to you by your health care provider. Make sure you discuss any questions you have with your health care provider. Document Revised: 09/06/2020 Document Reviewed: 09/06/2020 Elsevier Patient Education  2024 [employer].

## 2023-06-25 ENCOUNTER — Other Ambulatory Visit: Payer: Self-pay | Admitting: Neurology

## 2023-06-25 NOTE — Telephone Encounter (Signed)
 Requested Prescriptions   Pending Prescriptions Disp Refills   Lacosamide  150 MG TABS [Pharmacy Med Name: LACOSAMIDE  150 MG TABLET] 180 tablet 1    Sig: TAKE 1 TABLET BY MOUTH TWICE A DAY   Last Seen: 09/18/22 Next Appt: 08/08/23  Last Note Stated:  -For now, continue Trileptal  150 mg twice daily, Vimpat  150 mg twice daily   Dispenses   Dispensed Days Supply Quantity Provider Pharmacy  LACOSAMIDE  150 MG TABLET 05/27/2023 30 60 each Wess Hammed, NP [facility] [phone] - [facility]...  LACOSAMIDE  150 MG TABLET 04/28/2023 30 60 each Wess Hammed, NP [facility] [phone] - G...  LACOSAMIDE  150 MG TABLET 03/30/2023 30 60 each Wess Hammed, NP [facility] [phone] - G...  LACOSAMIDE  150 MG TABLET 12/31/2022 90 180 each Wess Hammed, NP [facility] [phone] - [facility]...  lacosamide  150 mg tablet 09/18/2022 30 60 each Camara, Amadou, MD [facility]  lacosamide  150 mg tablet 08/16/2022 30 60 each Cassandra Cleveland, MD [facility]  lacosamide  150 mg tablet 07/17/2022 30 60 each Cassandra Cleveland, MD [facility]Aaron AasAaron Aas

## 2023-07-16 ENCOUNTER — Ambulatory Visit: Admitting: Nurse Practitioner

## 2023-07-19 ENCOUNTER — Ambulatory Visit (INDEPENDENT_AMBULATORY_CARE_PROVIDER_SITE_OTHER): Payer: 59

## 2023-07-19 VITALS — BP 104/60 | HR 73 | Temp 98.8°F | Ht 61.0 in | Wt 125.4 lb

## 2023-07-19 DIAGNOSIS — Z0000 Encounter for general adult medical examination without abnormal findings: Secondary | ICD-10-CM | POA: Diagnosis not present

## 2023-07-19 NOTE — Patient Instructions (Signed)
 Ms. Tatem , Thank you for taking time out of your busy schedule to complete your Annual Wellness Visit with me. I enjoyed our conversation and look forward to speaking with you again next year. I, as well as your care team,  appreciate your ongoing commitment to your health goals. Please review the following plan we discussed and let me know if I can assist you in the future. Your Game plan/ To Do List    Referrals: If you haven't heard from the office you've been referred to, please reach out to them at the phone provided.  N/a Follow up Visits: Next Medicare AWV with our clinical staff: 07/24/2024 at 1:00   Have you seen your provider in the last 6 months (3 months if uncontrolled diabetes)? Yes Next Office Visit with your provider: 08/01/2023 at 9:20  Clinician Recommendations:  Aim for 30 minutes of exercise or brisk walking, 6-8 glasses of water, and 5 servings of fruits and vegetables each day.       This is a list of the screening recommended for you and due dates:  Health Maintenance  Topic Date Due   Hepatitis C Screening  Never done   Pneumococcal Vaccine for age over 62 (1 of 2 - PCV) Never done   COVID-19 Vaccine (3 - 2024-25 season) 10/07/2022   Eye exam for diabetics  01/25/2023   Yearly kidney function blood test for diabetes  09/18/2023   Yearly kidney health urinalysis for diabetes  10/16/2023   Complete foot exam   10/16/2023   Hemoglobin A1C  10/16/2023   Medicare Annual Wellness Visit  07/18/2024   DEXA scan (bone density measurement)  Completed   HPV Vaccine  Aged Out   Meningitis B Vaccine  Aged Out   DTaP/Tdap/Td vaccine  Discontinued   Flu Shot  Discontinued   Colon Cancer Screening  Discontinued   Zoster (Shingles) Vaccine  Discontinued    Advanced directives: (Declined) Advance directive discussed with you today. Even though you declined this today, please call our office should you change your mind, and we can give you the proper paperwork for you to fill  out. Advance Care Planning is important because it:  [x]  Makes sure you receive the medical care that is consistent with your values, goals, and preferences  [x]  It provides guidance to your family and loved ones and reduces their decisional burden about whether or not they are making the right decisions based on your wishes.  Follow the link provided in your after visit summary or read over the paperwork we have mailed to you to help you started getting your Advance Directives in place. If you need assistance in completing these, please reach out to us  so that we can help you!  See attachments for Preventive Care and Fall Prevention Tips.

## 2023-07-19 NOTE — Progress Notes (Signed)
 Subjective:   Janiylah Hannis is a 78 y.o. who presents for a Medicare Wellness preventive visit.  As a reminder, Annual Wellness Visits don't include a physical exam, and some assessments may be limited, especially if this visit is performed virtually. We may recommend an in-person follow-up visit with your provider if needed.  Visit Complete: In person    Persons Participating in Visit: Patient.  AWV Questionnaire: Yes: Patient Medicare AWV questionnaire was completed by the patient on 07/18/2023; I have confirmed that all information answered by patient is correct and no changes since this date.  Cardiac Risk Factors include: advanced age (>32men, >91 women);diabetes mellitus;dyslipidemia;hypertension     Objective:    Today's Vitals   07/19/23 1317  BP: 104/60  Pulse: 73  Temp: 98.8 F (37.1 C)  TempSrc: Oral  SpO2: 98%  Weight: 125 lb 6.4 oz (56.9 kg)  Height: 5' 1 (1.549 m)   Body mass index is 23.69 kg/m.     07/19/2023    1:23 PM 07/13/2022    9:56 AM 07/11/2021   10:54 AM 04/25/2021   10:30 AM 12/08/2020    8:05 AM 10/27/2020    6:42 PM 04/13/2020    7:07 PM  Advanced Directives  Does Patient Have a Medical Advance Directive? No No No No No No No  Would patient like information on creating a medical advance directive? No - Patient declined No - Patient declined No - Patient declined No - Patient declined No - Patient declined No - Patient declined     Current Medications (verified) Outpatient Encounter Medications as of 07/19/2023  Medication Sig   ACCU-CHEK GUIDE test strip USE TO TEST DAILY   blood glucose meter kit and supplies KIT Dispense based on patient and insurance preference. Check glucose daily, E11.65   Lacosamide  150 MG TABS TAKE 1 TABLET BY MOUTH TWICE A DAY   Lacosamide  150 MG TABS TAKE 1 TABLET BY MOUTH TWICE A DAY   omeprazole  (PRILOSEC) 40 MG capsule Take 1 capsule (40 mg total) by mouth daily.   OXcarbazepine  (TRILEPTAL ) 150 MG tablet TAKE 1  TABLET BY MOUTH TWICE A DAY   rosuvastatin  (CRESTOR ) 20 MG tablet TAKE 1 TABLET BY MOUTH EVERY DAY   No facility-administered encounter medications on file as of 07/19/2023.    Allergies (verified) Patient has no known allergies.   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, requiring surgery   Memory deficit 05/20/2014   Murmur, cardiac 10/16/2022   Partial symptomatic epilepsy with complex partial seizures, not intractable, without status epilepticus (HCC) 05/20/2014   most recent 09/10/20   Ventral hernia    Past Surgical History:  Procedure Laterality Date   BRAIN SURGERY  02/03/2020   COLONOSCOPY WITH PROPOFOL  N/A 12/08/2020   Procedure: COLONOSCOPY WITH PROPOFOL ;  Surgeon: Luke Salaam, MD;  Location[facility] END[facility]COPY;  Service: Gastroenterology;  Laterality: N/A;   ESOPHAGOGASTRODUODENOSCOPY N/A 12/08/2020   Procedure: ESOPHAGOGASTRODUODENOSCOPY (EGD);  Surgeon: Luke Salaam, MD;  Location: [facility] ENDOSCOPY;  Service: Gastroenterology;  Laterality: N/A;   HERNIA REPAIR     SUBDURAL HEMATOMA EVACUATION VIA CRANIOTOMY  01/2020   TUBAL LIGATION     VENTRAL HERNIA REPAIR     Family History  Problem Relation Age of Onset   Arthritis Mother    Cancer Brother        lung  Colon cancer Neg Hx    Esophageal cancer Neg Hx    Stomach cancer Neg Hx    Liver disease Neg Hx    Social History   Socioeconomic History   Marital status: Divorced    Spouse name: Not on file   Number of children: 3   Years of education: 18 years   Highest education level: Bachelor's degree (e.g., BA, AB, BS)  Occupational History   Occupation: unemployed  Tobacco Use   Smoking status: Never   Smokeless tobacco: Never  Vaping Use   Vaping status: Never Used  Substance and Sexual Activity   Alcohol use: Never   Drug use: Never   Sexual  activity: Never  Other Topics Concern   Not on file  Social History Narrative   10/06/20 lives with dgtr   Patient is right handed.   Patient does not drink caffeine.   Social Drivers of Corporate investment banker Strain: Low Risk  (07/18/2023)   Overall Financial Resource Strain (CARDIA)    Difficulty of Paying Living Expenses: Not hard at all  Food Insecurity: No Food Insecurity (07/18/2023)   Hunger Vital Sign    Worried About Running Out of Food in the Last Year: Never true    Ran Out of Food in the Last Year: Never true  Transportation Needs: No Transportation Needs (07/18/2023)   PRAPARE - Administrator, Civil Service (Medical): No    Lack of Transportation (Non-Medical): No  Physical Activity: Sufficiently Active (07/18/2023)   Exercise Vital Sign    Days of Exercise per Week: 6 days    Minutes of Exercise per Session: 30 min  Stress: Stress Concern Present (07/18/2023)   [employer] of Occupational Health - Occupational Stress Questionnaire    Feeling of Stress: To some extent  Social Connections: Moderately Isolated (07/18/2023)   Social Connection and Isolation Panel    Frequency of Communication with Friends and Family: More than three times a week    Frequency of Social Gatherings with Friends and Family: More than three times a week    Attends Religious Services: More than 4 times per year    Active Member of [employer] or Organizations: No    Attends Engineer, structural: Not on file    Marital Status: Divorced    Tobacco Counseling Counseling given: Not Answered    Clinical Intake:  Pre-visit preparation completed: Yes  Pain : No/denies pain     Nutritional Status: BMI of 19-24  Normal Nutritional Risks: None Diabetes: Yes CBG done?: No Did pt. bring in CBG monitor from home?: No  Lab Results  Component Value Date   HGBA1C 5.7 (A) 04/15/2023   HGBA1C 5.9 (A) 10/16/2022   HGBA1C 6.1 06/12/2022     How often do you need to  have someone help you when you read instructions, pamphlets, or other written materials from your doctor or pharmacy?: 1 - Never  Interpreter Needed?: No  Information entered by :: NAllen LPN   Activities of Daily Living     07/18/2023   12:07 PM  In your present state of health, do you have any difficulty performing the following activities:  Hearing? 0  Vision? 0  Difficulty concentrating or making decisions? 1  Walking or climbing stairs? 0  Dressing or bathing? 0  Doing errands, shopping? 1  Comment has someone goes with her  Preparing Food and eating ? N  Using the Toilet? N  In the past six  months, have you accidently leaked urine? Y  Comment held too long  Do you have problems with loss of bowel control? N  Managing your Medications? Y  Managing your Finances? Y  Housekeeping or managing your Housekeeping? N    Patient Care Team: Nche, Connye Delaine, NP as PCP - General (Internal Medicine)  I have updated your Care Teams any recent Medical Services you may have received from other providers in the past year.     Assessment:   This is a routine wellness examination for Emmalyne.  Hearing/Vision screen Hearing Screening - Comments:: Denies hearing issues Vision Screening - Comments:: No regular eye exams,   Goals Addressed             This Visit's Progress    Patient Stated       07/19/2023, wants to gain weight       Depression Screen     07/19/2023    1:24 PM 03/08/2023    1:31 PM 07/13/2022    1:32 PM 07/13/2022    9:57 AM 06/12/2022    9:08 AM 07/11/2021   10:56 AM 07/11/2021   10:52 AM  PHQ 2/9 Scores  PHQ - 2 Score 0 0 0 0 0 0 0  PHQ- 9 Score 0          Fall Risk     07/18/2023   12:07 PM 03/08/2023    1:31 PM 07/13/2022    1:32 PM 07/13/2022    9:57 AM 09/04/2021    1:12 PM  Fall Risk   Falls in the past year? 0 0 0 0 0  Number falls in past yr: 0 0 0 0 0  Injury with Fall? 0 0 0 0 0  Risk for fall due to : Medication side effect Other (Comment)  No Fall Risks Medication side effect   Follow up Falls prevention discussed;Falls evaluation completed Falls evaluation completed  Falls prevention discussed;Education provided;Falls evaluation completed     MEDICARE RISK AT HOME:  Medicare Risk at Home Any stairs in or around the home?: (Patient-Rptd) Yes If so, are there any without handrails?: (Patient-Rptd) Yes Home free of loose throw rugs in walkways, pet beds, electrical cords, etc?: (Patient-Rptd) Yes Adequate lighting in your home to reduce risk of falls?: (Patient-Rptd) Yes Life alert?: (Patient-Rptd) No Use of a cane, walker or w/c?: (Patient-Rptd) No Grab bars in the bathroom?: (Patient-Rptd) No Shower chair or bench in shower?: (Patient-Rptd) Yes Elevated toilet seat or a handicapped toilet?: (Patient-Rptd) Yes  TIMED UP AND GO:  Was the test performed?  Yes  Length of time to ambulate 10 feet: 5 sec Gait steady and fast without use of assistive device  Cognitive Function: 6CIT completed    10/06/2020    9:47 AM 11/19/2014    8:14 AM 05/20/2014    8:06 AM  MMSE - Mini Mental State Exam  Orientation to time 4 5  5    Orientation to Place 4 5  5    Registration 3 3  3    Attention/ Calculation 4 5  5    Recall 2 2  2    Language- name 2 objects 2 2  2    Language- repeat 0 1 1  Language- follow 3 step command 3 3  3    Language- read & follow direction 1 1  1    Write a sentence 1 1  1    Copy design 1 1  1    Total score 25 29  29  Data saved with a previous flowsheet row definition        07/19/2023    1:26 PM 07/13/2022    9:58 AM  6CIT Screen  What Year? 0 points 0 points  What month? 0 points 0 points  What time? 0 points 0 points  Count back from 20 0 points 0 points  Months in reverse 0 points 0 points  Repeat phrase 4 points 10 points  Total Score 4 points 10 points    Immunizations Immunization History  Administered Date(s) Administered   PFIZER(Purple Top)SARS-COV-2 Vaccination 09/01/2019,  09/22/2019    Screening Tests Health Maintenance  Topic Date Due   Hepatitis C Screening  Never done   Pneumococcal Vaccine: 50+ Years (1 of 2 - PCV) Never done   COVID-19 Vaccine (3 - 2024-25 season) 10/07/2022   OPHTHALMOLOGY EXAM  01/25/2023   Diabetic kidney evaluation - eGFR measurement  09/18/2023   Diabetic kidney evaluation - Urine ACR  10/16/2023   FOOT EXAM  10/16/2023   HEMOGLOBIN A1C  10/16/2023   Medicare Annual Wellness (AWV)  07/18/2024   DEXA SCAN  Completed   HPV VACCINES  Aged Out   Meningococcal B Vaccine  Aged Out   DTaP/Tdap/Td  Discontinued   INFLUENZA VACCINE  Discontinued   Colonoscopy  Discontinued   Zoster Vaccines- Shingrix  Discontinued    Health Maintenance  Health Maintenance Due  Topic Date Due   Hepatitis C Screening  Never done   Pneumococcal Vaccine: 50+ Years (1 of 2 - PCV) Never done   COVID-19 Vaccine (3 - 2024-25 season) 10/07/2022   OPHTHALMOLOGY EXAM  01/25/2023   Health Maintenance Items Addressed: Declines vaccines. States she will get eyes checked. Due for Hep C screening.  Additional Screening:  Vision Screening: Recommended annual ophthalmology exams for early detection of glaucoma and other disorders of the eye. Would you like a referral to an eye doctor? No    Dental Screening: Recommended annual dental exams for proper oral hygiene  Community Resource Referral / Chronic Care Management: CRR required this visit?  No   CCM required this visit?  No   Plan:    I have personally reviewed and noted the following in the patient's chart:   Medical and social history Use of alcohol, tobacco or illicit drugs  Current medications and supplements including opioid prescriptions. Patient is not currently taking opioid prescriptions. Functional ability and status Nutritional status Physical activity Advanced directives List of other physicians Hospitalizations, surgeries, and ER visits in previous 12  months Vitals Screenings to include cognitive, depression, and falls Referrals and appointments  In addition, I have reviewed and discussed with patient certain preventive protocols, quality metrics, and best practice recommendations. A written personalized care plan for preventive services as well as general preventive health recommendations were provided to patient.   Areatha Beecham, LPN   1/61/0960   After Visit Summary: (In Person-Printed) AVS printed and given to the patient  Notes: Nothing significant to report at this time.

## 2023-07-26 ENCOUNTER — Emergency Department (HOSPITAL_COMMUNITY)
Admission: EM | Admit: 2023-07-26 | Discharge: 2023-07-26 | Discharge status: Against Medical Advice | Attending: Emergency Medicine | Admitting: Emergency Medicine

## 2023-07-26 ENCOUNTER — Other Ambulatory Visit: Payer: Self-pay

## 2023-07-26 ENCOUNTER — Encounter (HOSPITAL_COMMUNITY): Payer: Self-pay

## 2023-07-26 DIAGNOSIS — R102 Pelvic and perineal pain: Secondary | ICD-10-CM | POA: Insufficient documentation

## 2023-07-26 DIAGNOSIS — Z5321 Procedure and treatment not carried out due to patient leaving prior to being seen by health care provider: Secondary | ICD-10-CM | POA: Insufficient documentation

## 2023-07-26 LAB — URINALYSIS, ROUTINE W REFLEX MICROSCOPIC
Bilirubin Urine: NEGATIVE
Glucose, UA: NEGATIVE mg/dL
Hgb urine dipstick: NEGATIVE
Ketones, ur: NEGATIVE mg/dL
Nitrite: NEGATIVE
Protein, ur: NEGATIVE mg/dL
Specific Gravity, Urine: 1.002 — ABNORMAL LOW (ref 1.005–1.030)
pH: 8 (ref 5.0–8.0)

## 2023-07-26 NOTE — ED Triage Notes (Signed)
 Pt states she has not felt well. Pt states she has pain in her vaginal area and feels like something has busted and is draining. Pt has had fever and chills for past 2 days.

## 2023-07-26 NOTE — ED Notes (Addendum)
 Pt. Called for repeat vitals w/ no response. Moved OTF

## 2023-07-26 NOTE — ED Provider Triage Note (Signed)
 Emergency Medicine Provider Triage Evaluation Note  Sabrina Guerrero , a 78 y.o. female  was evaluated in triage.  Pt complains of sensation of a little ball in her private area. Been going on for several days. Located in the middle. Thinks it's leaking fluid intermittently. No f/c. Reports very slight abdominal pain.  Review of Systems  Positive: Swelling or ball sensation in her vulva/vaginal area Negative: F/c, nausea/vomiting  Physical Exam  BP (!) 143/77 (BP Location: Right Arm)   Pulse 73   Temp 98.1 F (36.7 C) (Oral)   Resp 17   Ht 5' 2 (1.575 m)   Wt 68 kg   SpO2 99%   BMI 27.44 kg/m  Gen:   Awake, no distress   Resp:  Normal effort  MSK:   Moves extremities without difficulty   Medical Decision Making  Medically screening exam initiated at 12:21 PM.  Appropriate orders placed.  Cristina Ceniceros was informed that the remainder of the evaluation will be completed by another provider, this initial triage assessment does not replace that evaluation, and the importance of remaining in the ED until their evaluation is complete.    Merdis Stalling, MD 07/26/23 1224

## 2023-08-01 ENCOUNTER — Ambulatory Visit: Admitting: Nurse Practitioner

## 2023-08-01 ENCOUNTER — Encounter: Payer: Self-pay | Admitting: Nurse Practitioner

## 2023-08-01 VITALS — BP 112/60 | HR 74 | Temp 97.6°F | Ht 61.0 in | Wt 127.4 lb

## 2023-08-01 DIAGNOSIS — Z1231 Encounter for screening mammogram for malignant neoplasm of breast: Secondary | ICD-10-CM

## 2023-08-01 DIAGNOSIS — F441 Dissociative fugue: Secondary | ICD-10-CM | POA: Diagnosis not present

## 2023-08-01 DIAGNOSIS — M85832 Other specified disorders of bone density and structure, left forearm: Secondary | ICD-10-CM | POA: Diagnosis not present

## 2023-08-01 DIAGNOSIS — E1165 Type 2 diabetes mellitus with hyperglycemia: Secondary | ICD-10-CM | POA: Diagnosis not present

## 2023-08-01 DIAGNOSIS — M85831 Other specified disorders of bone density and structure, right forearm: Secondary | ICD-10-CM

## 2023-08-01 DIAGNOSIS — R7989 Other specified abnormal findings of blood chemistry: Secondary | ICD-10-CM

## 2023-08-01 DIAGNOSIS — G40209 Localization-related (focal) (partial) symptomatic epilepsy and epileptic syndromes with complex partial seizures, not intractable, without status epilepticus: Secondary | ICD-10-CM | POA: Diagnosis not present

## 2023-08-01 LAB — POCT GLYCOSYLATED HEMOGLOBIN (HGB A1C): Hemoglobin A1C: 6.3 % — AB (ref 4.0–5.6)

## 2023-08-01 NOTE — Patient Instructions (Signed)
 Schedule appointment with dentist, opthalmology, mammogram and bone density. Schedule lab appointment with check THYROID  function in 27month.  Diabetes Mellitus and Nutrition, Adult When you have diabetes, or diabetes mellitus, it is very important to have healthy eating habits because your blood sugar (glucose) levels are greatly affected by what you eat and drink. Eating healthy foods in the right amounts, at about the same times every day, can help you: Manage your blood glucose. Lower your risk of heart disease. Improve your blood pressure. Reach or maintain a healthy weight. What can affect my meal plan? Every person with diabetes is different, and each person has different needs for a meal plan. Your health care provider may recommend that you work with a dietitian to make a meal plan that is best for you. Your meal plan may vary depending on factors such as: The calories you need. The medicines you take. Your weight. Your blood glucose, blood pressure, and cholesterol levels. Your activity level. Other health conditions you have, such as heart or kidney disease. How do carbohydrates affect me? Carbohydrates, also called carbs, affect your blood glucose level more than any other type of food. Eating carbs raises the amount of glucose in your blood. It is important to know how many carbs you can safely have in each meal. This is different for every person. Your dietitian can help you calculate how many carbs you should have at each meal and for each snack. How does alcohol affect me? Alcohol can cause a decrease in blood glucose (hypoglycemia), especially if you use insulin or take certain diabetes medicines by mouth. Hypoglycemia can be a life-threatening condition. Symptoms of hypoglycemia, such as sleepiness, dizziness, and confusion, are similar to symptoms of having too much alcohol. Do not drink alcohol if: Your health care provider tells you not to drink. You are pregnant, may be  pregnant, or are planning to become pregnant. If you drink alcohol: Limit how much you have to: 0-1 drink a day for women. 0-2 drinks a day for men. Know how much alcohol is in your drink. In the [place] one drink equals one 12 oz bottle of beer (355 mL), one 5 oz glass of wine (148 mL), or one 1 oz glass of hard liquor (44 mL). Keep yourself hydrated with water, diet soda, or unsweetened iced tea. Keep in mind that regular soda, juice, and other mixers may contain a lot of sugar and must be counted as carbs. What are tips for following this plan?  Reading food labels Start by checking the serving size on the Nutrition Facts label of packaged foods and drinks. The number of calories and the amount of carbs, fats, and other nutrients listed on the label are based on one serving of the item. Many items contain more than one serving per package. Check the total grams (g) of carbs in one serving. Check the number of grams of saturated fats and trans fats in one serving. Choose foods that have a low amount or none of these fats. Check the number of milligrams (mg) of salt (sodium) in one serving. Most people should limit total sodium intake to less than 2,300 mg per day. Always check the nutrition information of foods labeled as low-fat or nonfat. These foods may be higher in added sugar or refined carbs and should be avoided. Talk to your dietitian to identify your daily goals for nutrients listed on the label. Shopping Avoid buying canned, pre-made, or processed foods. These foods tend to be high  in fat, sodium, and added sugar. Shop around the outside edge of the grocery store. This is where you will most often find fresh fruits and vegetables, bulk grains, fresh meats, and fresh dairy products. Cooking Use low-heat cooking methods, such as baking, instead of high-heat cooking methods, such as deep frying. Cook using healthy oils, such as olive, canola, or sunflower oil. Avoid cooking with  butter, cream, or high-fat meats. Meal planning Eat meals and snacks regularly, preferably at the same times every day. Avoid going long periods of time without eating. Eat foods that are high in fiber, such as fresh fruits, vegetables, beans, and whole grains. Eat 4-6 oz (112-168 g) of lean protein each day, such as lean meat, chicken, fish, eggs, or tofu. One ounce (oz) (28 g) of lean protein is equal to: 1 oz (28 g) of meat, chicken, or fish. 1 egg.  cup (62 g) of tofu. Eat some foods each day that contain healthy fats, such as avocado, nuts, seeds, and fish. What foods should I eat? Fruits Berries. Apples. Oranges. Peaches. Apricots. Plums. Grapes. Mangoes. Papayas. Pomegranates. Kiwi. Cherries. Vegetables Leafy greens, including lettuce, spinach, kale, chard, collard greens, mustard greens, and cabbage. Beets. Cauliflower. Broccoli. Carrots. Green beans. Tomatoes. Peppers. Onions. Cucumbers. Brussels sprouts. Grains Whole grains, such as whole-wheat or whole-grain bread, crackers, tortillas, cereal, and pasta. Unsweetened oatmeal. Quinoa. Brown or wild rice. Meats and other proteins Seafood. Poultry without skin. Lean cuts of poultry and beef. Tofu. Nuts. Seeds. Dairy Low-fat or fat-free dairy products such as milk, yogurt, and cheese. The items listed above may not be a complete list of foods and beverages you can eat and drink. Contact a dietitian for more information. What foods should I avoid? Fruits Fruits canned with syrup. Vegetables Canned vegetables. Frozen vegetables with butter or cream sauce. Grains Refined white flour and flour products such as bread, pasta, snack foods, and cereals. Avoid all processed foods. Meats and other proteins Fatty cuts of meat. Poultry with skin. Breaded or fried meats. Processed meat. Avoid saturated fats. Dairy Full-fat yogurt, cheese, or milk. Beverages Sweetened drinks, such as soda or iced tea. The items listed above may not be a  complete list of foods and beverages you should avoid. Contact a dietitian for more information. Questions to ask a health care provider Do I need to meet with a certified diabetes care and education specialist? Do I need to meet with a dietitian? What number can I call if I have questions? When are the best times to check my blood glucose? Where to find more information: American Diabetes Association: diabetes.org Academy of Nutrition and Dietetics: eatright.[employer] of Diabetes and Digestive and Kidney Diseases: StageSync.si Association of Diabetes Care & Education Specialists: diabeteseducator.org Summary It is important to have healthy eating habits because your blood sugar (glucose) levels are greatly affected by what you eat and drink. It is important to use alcohol carefully. A healthy meal plan will help you manage your blood glucose and lower your risk of heart disease. Your health care provider may recommend that you work with a dietitian to make a meal plan that is best for you. This information is not intended to replace advice given to you by your health care provider. Make sure you discuss any questions you have with your health care provider. Document Revised: 08/25/2019 Document Reviewed: 08/26/2019 Elsevier Patient Education  2024 [employer].

## 2023-08-01 NOTE — Assessment & Plan Note (Signed)
 Repeat hgbA1c at 6.3%: increased from 5.7% She admits to high fat and high carb diet.  Advised about the importance of low carb/low sugar/low fat diet. No need forr med at this time She agreed to ophthalmology referral F/up in 3months

## 2023-08-01 NOTE — Assessment & Plan Note (Signed)
 secondary to seizure activity.  Daughter denies any change in mental status.  Mrs. Aicher discontinued lacosamide  x 1week due to UTI symptoms.  Advised Mrs. Gass to avoid inconsistent med dosage due to risk of seizure activity. Under the care of neurology

## 2023-08-01 NOTE — Assessment & Plan Note (Signed)
 No fracture Advised to maintain calcium  600mg  and vit. D3 800IU daily Repeat dexa scan

## 2023-08-01 NOTE — Assessment & Plan Note (Signed)
 No seizure activity in last 6months. Under the care of neurology Current use of lacosamide 

## 2023-08-01 NOTE — Progress Notes (Signed)
 Established Patient Visit  Patient: Sabrina Guerrero   DOB: 04-17-1945   78 y.o. Female  MRN: [id number] Visit Date: 08/01/2023  Subjective:    Chief Complaint  Patient presents with   Follow-up    (NOT FASTING) 3 month follow up for DM and hyperlipidemia    HPI Accompanied by daughter.  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  Daughter denies any change in mental status.  Sabrina Guerrero discontinued lacosamide  x 1week due to UTI symptoms.  Advised Sabrina Guerrero to avoid inconsistent med dosage due to risk of seizure activity. Under the care of neurology    Partial symptomatic epilepsy with complex partial seizures, not intractable, without status epilepticus No seizure activity in last 6months. Under the care of neurology Current use of lacosamide   Type 2 diabetes mellitus with hyperglycemia, without long-term current use of insulin (HCC) Repeat hgbA1c at 6.3%: increased from 5.7% She admits to high fat and high carb diet.  Advised about the importance of low carb/low sugar/low fat diet. No need forr med at this time She agreed to ophthalmology referral F/up in 3months  Osteopenia of both forearms No fracture Advised to maintain calcium  600mg  and vit. D3 800IU daily Repeat dexa scan  Left arm spasm and numbness, intermittent, improved with oral magnesium replacement.  Wt Readings from Last 3 Encounters:  08/01/23 127 lb 6.4 oz (57.8 kg)  07/26/23 150 lb (68 kg)  07/19/23 125 lb 6.4 oz (56.9 kg)    BP Readings from Last 3 Encounters:  08/01/23 112/60  07/26/23 (!) 143/77  07/19/23 104/60    Reviewed medical, surgical, and social history today  Medications: Outpatient Medications Prior to Visit  Medication Sig   ACCU-CHEK GUIDE test strip USE TO TEST DAILY   blood glucose meter kit and supplies KIT Dispense based on patient and insurance preference. Check glucose daily, E11.65   cephALEXin (KEFLEX) 500 MG capsule Take 500 mg by mouth 4  (four) times daily.   Lacosamide  150 MG TABS TAKE 1 TABLET BY MOUTH TWICE A DAY   omeprazole  (PRILOSEC) 40 MG capsule Take 1 capsule (40 mg total) by mouth daily.   OXcarbazepine  (TRILEPTAL ) 150 MG tablet TAKE 1 TABLET BY MOUTH TWICE A DAY   rosuvastatin  (CRESTOR ) 20 MG tablet TAKE 1 TABLET BY MOUTH EVERY DAY   Lacosamide  150 MG TABS TAKE 1 TABLET BY MOUTH TWICE A DAY   No facility-administered medications prior to visit.   Reviewed past medical and social history.   ROS per HPI above  Last CBC Lab Results  Component Value Date   WBC 3.0 (L) 09/18/2022   HGB 11.8 09/18/2022   HCT 35.8 09/18/2022   MCV 98 (H) 09/18/2022   MCH 32.2 09/18/2022   RDW 11.8 09/18/2022   PLT 204 09/18/2022   Last metabolic panel Lab Results  Component Value Date   GLUCOSE 94 09/18/2022   NA 134 09/18/2022   K 4.2 09/18/2022   CL 97 09/18/2022   CO2 26 09/18/2022   BUN 6 (L) 09/18/2022   CREATININE 0.62 09/18/2022   EGFR 92 09/18/2022   CALCIUM  9.4 09/18/2022   PHOS 3.8 06/12/2022   PROT 6.8 09/18/2022   ALBUMIN 3.7 (L) 09/18/2022   LABGLOB 3.1 09/18/2022   AGRATIO 1.4 03/01/2022   BILITOT 0.3 09/18/2022   ALKPHOS 100 09/18/2022   AST 18 09/18/2022   ALT 11 09/18/2022   ANIONGAP 9 09/24/2021  Last lipids Lab Results  Component Value Date   CHOL 213 (H) 04/20/2021   HDL 45.80 04/20/2021   LDLCALC 148 (H) 04/20/2021   LDLDIRECT 141.0 06/12/2022   TRIG 98.0 04/20/2021   CHOLHDL 5 04/20/2021   Last hemoglobin A1c Lab Results  Component Value Date   HGBA1C 6.3 (A) 08/01/2023   Last thyroid  functions Lab Results  Component Value Date   TSH 0.51 06/12/2022        Objective:  BP 112/60 (BP Location: Left Arm, Patient Position: Sitting, Cuff Size: Normal)   Pulse 74   Temp 97.6 F (36.4 C) (Oral)   Ht 5' 1 (1.549 m)   Wt 127 lb 6.4 oz (57.8 kg)   SpO2 99%   BMI 24.07 kg/m      Physical Exam Vitals and nursing note reviewed.   Cardiovascular:     Rate and Rhythm:  Normal rate and regular rhythm.     Pulses: Normal pulses.     Heart sounds: Normal heart sounds.  Pulmonary:     Effort: Pulmonary effort is normal.     Breath sounds: Normal breath sounds.   Neurological:     Mental Status: She is alert and oriented to person, place, and time.   Psychiatric:        Mood and Affect: Mood normal.        Behavior: Behavior normal.        Thought Content: Thought content normal.     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  Daughter denies any change in mental status.  Sabrina Guerrero discontinued lacosamide  x 1week due to UTI symptoms.  Advised Sabrina Guerrero to avoid inconsistent med dosage due to risk of seizure activity. Under the care of neurology        Low TSH level   Relevant Orders   Thyroid  Panel With TSH   Thyroid  Peroxidase Antibodies (TPO) (REFL)   Osteopenia of both forearms   No fracture Advised to maintain calcium  600mg  and vit. D3 800IU daily Repeat dexa scan      Relevant Orders   DG Bone Density   Partial symptomatic epilepsy with complex partial seizures, not intractable, without status epilepticus (HCC)   No seizure activity in last 6months. Under the care of neurology Current use of lacosamide       Type 2 diabetes mellitus with hyperglycemia, without long-term current use of insulin (HCC) - Primary   Repeat hgbA1c at 6.3%: increased from 5.7% She admits to high fat and high carb diet.  Advised about the importance of low carb/low sugar/low fat diet. No need forr med at this time She agreed to ophthalmology referral F/up in 3months      Relevant  Orders   Ambulatory referral to Ophthalmology   POCT glycosylated hemoglobin (Hb A1C) (Completed)   Other Visit Diagnoses       Breast cancer screening by mammogram       Relevant Orders   MM 3D SCREENING MAMMOGRAM BILATERAL BREAST      Return  in about 3 months (around 11/01/2023) for HTN, DM, hyperlipidemia (fasting).     Roselie Mood, NP

## 2023-08-08 ENCOUNTER — Ambulatory Visit: Payer: 59 | Admitting: Neurology

## 2023-08-08 ENCOUNTER — Encounter: Payer: Self-pay | Admitting: Neurology

## 2023-08-14 ENCOUNTER — Ambulatory Visit (INDEPENDENT_AMBULATORY_CARE_PROVIDER_SITE_OTHER): Admitting: Neurology

## 2023-08-14 ENCOUNTER — Encounter: Payer: Self-pay | Admitting: Neurology

## 2023-08-14 ENCOUNTER — Ambulatory Visit
Admission: RE | Admit: 2023-08-14 | Discharge: 2023-08-14 | Disposition: A | Discharge status: Home / Self Care | Source: Ambulatory Visit | Attending: Nurse Practitioner

## 2023-08-14 ENCOUNTER — Ambulatory Visit

## 2023-08-14 VITALS — BP 134/86 | HR 70 | Resp 14 | Ht 62.0 in | Wt 130.5 lb

## 2023-08-14 DIAGNOSIS — Z1231 Encounter for screening mammogram for malignant neoplasm of breast: Secondary | ICD-10-CM

## 2023-08-14 DIAGNOSIS — G40209 Localization-related (focal) (partial) symptomatic epilepsy and epileptic syndromes with complex partial seizures, not intractable, without status epilepticus: Secondary | ICD-10-CM | POA: Diagnosis not present

## 2023-08-14 DIAGNOSIS — Z87828 Personal history of other (healed) physical injury and trauma: Secondary | ICD-10-CM

## 2023-08-14 DIAGNOSIS — Z5181 Encounter for therapeutic drug level monitoring: Secondary | ICD-10-CM | POA: Diagnosis not present

## 2023-08-14 MED ORDER — LACOSAMIDE 150 MG PO TABS
1.0000 | ORAL_TABLET | Freq: Two times a day (BID) | ORAL | 3 refills | Status: AC
Start: 2023-08-14 — End: ?

## 2023-08-14 MED ORDER — OXCARBAZEPINE 150 MG PO TABS: 150.0000 mg | ORAL_TABLET | Freq: Two times a day (BID) | ORAL | 3 refills | Status: AC

## 2023-08-14 NOTE — Patient Instructions (Signed)
-  No recent seizures, check antiseizure medication levels -For now, continue Trileptal  150 mg twice daily, Vimpat  150 mg twice daily, refill given -Daughter is now assisting with medication management, she is no longer driving -Follow-up in 1 year or sooner if worse. Can follow up with Lauraine

## 2023-08-14 NOTE — Progress Notes (Signed)
 PATIENT: Sabrina Guerrero DOB: Jul 19, 1945  REASON FOR VISIT: Follow up for seizures HISTORY FROM: Patient, daughter Sabrina PRIMARY NEUROLOGIST: Dr. Gregg  HISTORY OF PRESENT ILLNESS: Today 08/14/23 Patient presents today for follow-up, last visit was in November.  Today she is accompanied by her daughter.  They do not report any seizure or seizure-like activity.  Daughter tells me for period of 2 weeks patient was not taking her anti seizure medications because she felt the medication was causing muscular pain and cramping.  She did present to urgent care, was told she has a low electrolytes, advised to start magnesium supplementation.  She was also told that she had a UTI, recently finish her treatment.  For the past 2 to 3 weeks she has been compliant with her lacosamide  and oxcarbazepine . There is also concern about worsening memory loss.  Daughter tells me the patient is very forgetful and sometimes she perseverate on ideas.  They saw Dr. 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   INTERVAL HISTORY 12/25/2022: Labs January 2024 showed sodium level 132, Trileptal  level 7, Vimpat  level 13.6. Here with daughter. If overusing right arm may have sharp pain in right arm, rest goes away, not weekly. Taking Vimpat  150 mg BID, Trileptal  150 mg BID. In the past her seizures have been staring events. In March she had gone back to Trileptal  300 mg BID, her daughter helps now with medications, she stopped driving. Does have some memory issues, trouble with short term.  Had neuropsychological evaluation with Dr. Corina in 2022, did not feel there was a neurodegenerative disorder.  Felt memory issues were related to seizure disorder.  03/01/22 SS: Here alone, she drove here. Doing well. No seizures. Medications listed are: Trileptal  150 mg BID, Vimpat  150 mg BID. Back in Sept 2022 Dr. Jenel has increased her Trileptal  to 300 mg  BID due to seizures.  I called the pharmacy, the last time she refilled Trileptal  at the 300 mg strength was in March 2023.  Since then she has been on 150 mg twice a day.   08/29/21 Dr. Gregg: Patient presents today for follow-up, she is accompanied by her daughter.  Last visit was in January and since then she denies any additional seizures or seizure-like activity.  She is currently on Vimpat  100 mg twice daily and Trileptal  300 mg twice daily, denies any side effect of the medication and reported tolerance.  INTERVAL HISTORY 02/28/21:  Sabrina Guerrero here today for follow-up with history of seizures.  Had about 3 seizures in 2022, at last visit with Dr. Jenel, Trileptal  was increased to 300 mg twice daily. EEG was normal in November 2022.  Vimpat  was added in November 2022 due to breakthrough seizures. 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 December 2021 had brain surgery for SDH. She lives with her daughter. Walks 20 minutes almost daily. No falls.   HISTORY  10/06/20 Dr. Jenel: Ms. Guerrero is a 78 year old right-handed black female with a history of diabetes and a history of seizures.  The patient has been well controlled on a very low-dose of Trileptal  taking 150 mg twice daily for a number of years.  Within the last year, she did  have a fall and developed bilateral subdural hematoma requiring surgery.  She had fallen in October 2021, presented to the hospital on 01 February 2020 with gait instability and confusion.  She lives with her daughter who has noted that over the last several months she has sustained 3 witnessed seizure events.  The patient would not remember whether or not she had a seizure if she were alone.   The patient will have a 10 to 15-minute episode of staring off and associated confusion with gradual return to normal baseline.  The patient has developed a memory disorder, she has been set up for formal neuropsychological testing in November 2022.  She is no longer operating a motor vehicle.  She does report occasional episodes of soreness in the head that may come and go, she has had 4-5 such episodes since her surgery for the subdural hematoma.  She returns to this office for an evaluation.  REVIEW OF SYSTEMS: Out of a complete 14 system review of symptoms, the patient complains only of the following symptoms, and all other reviewed systems are negative.  See HPI  ALLERGIES: No Known Allergies  HOME MEDICATIONS: Outpatient Medications Prior to Visit  Medication Sig Dispense Refill   ACCU-CHEK GUIDE test strip USE TO TEST DAILY 100 strip 2   blood glucose meter kit and supplies KIT Dispense based on patient and insurance preference. Check glucose daily, E11.65 1 each 0   Lacosamide  150 MG TABS TAKE 1 TABLET BY MOUTH TWICE A DAY 180 tablet 0   Lacosamide  150 MG TABS TAKE 1 TABLET BY MOUTH TWICE A DAY 180 tablet 1   omeprazole  (PRILOSEC) 40 MG capsule Take 1 capsule (40 mg total) by mouth daily. 180 capsule 1   OXcarbazepine  (TRILEPTAL ) 150 MG tablet TAKE 1 TABLET BY MOUTH TWICE A DAY 180 tablet 1   rosuvastatin  (CRESTOR ) 20 MG tablet TAKE 1 TABLET BY MOUTH EVERY DAY 90 tablet 1   No facility-administered medications prior to visit.    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, requiring surgery   Memory deficit 05/20/2014   Murmur, cardiac 10/16/2022   Partial symptomatic epilepsy with complex partial seizures, not intractable, without status  epilepticus (HCC) 05/20/2014   most recent 09/10/20   Ventral hernia     PAST SURGICAL HISTORY: Past Surgical History:  Procedure Laterality Date   BRAIN SURGERY  02/03/2020   COLONOSCOPY WITH PROPOFOL  N/A 12/08/2020   Procedure: COLONOSCOPY WITH PROPOFOL ;  Surgeon: Therisa Bi, MD;  Location: [facility] ENDOSCOPY;  Service: Gastroenterology;  Laterality: N/A;   ESOPHAGOGASTRODUODENOSCOPY N/A 12/08/2020   Procedure: ESOPHAGOGASTRODUODENOSCOPY (EGD);  Surgeon: Therisa Bi, MD;  Location: [facility] ENDOSCOPY;  Service: Gastroenterology;  Laterality: N/A;   HERNIA REPAIR     SUBDURAL HEMATOMA EVACUATION VIA CRANIOTOMY  01/2020   TUBAL LIGATION     VENTRAL HERNIA REPAIR      FAMILY HISTORY: Family History  Problem Relation Age of Onset   Arthritis Mother    Cancer Brother        lung   Colon cancer Neg Hx    Esophageal cancer Neg Hx    Stomach cancer Neg Hx    Liver disease Neg Hx     SOCIAL HISTORY: Social History   Socioeconomic History   Marital  status: Divorced    Spouse name: Not on file   Number of children: 3   Years of education: 18 years   Highest education level: Bachelor's degree (e.g., BA, AB, BS)  Occupational History   Occupation: unemployed  Tobacco Use   Smoking status: Never   Smokeless tobacco: Never  Vaping Use   Vaping status: Never Used  Substance and Sexual Activity   Alcohol use: Never   Drug use: Never   Sexual activity: Never  Other Topics Concern   Not on file  Social History Narrative   10/06/20 lives with dgtr   Patient is right handed.   Patient does not drink caffeine.   Social Drivers of Corporate investment banker Strain: Low Risk  (07/18/2023)   Overall Financial Resource Strain (CARDIA)    Difficulty of Paying Living Expenses: Not hard at all  Food Insecurity: No Food Insecurity (07/18/2023)   Hunger Vital Sign    Worried About Running Out of Food in the Last Year: Never true    Ran Out of Food in the Last Year: Never true   Transportation Needs: No Transportation Needs (07/18/2023)   PRAPARE - Administrator, Civil Service (Medical): No    Lack of Transportation (Non-Medical): No  Physical Activity: Sufficiently Active (07/18/2023)   Exercise Vital Sign    Days of Exercise per Week: 6 days    Minutes of Exercise per Session: 30 min  Stress: Stress Concern Present (07/18/2023)   [employer] of Occupational Health - Occupational Stress Questionnaire    Feeling of Stress: To some extent  Social Connections: Moderately Isolated (07/18/2023)   Social Connection and Isolation Panel    Frequency of Communication with Friends and Family: More than three times a week    Frequency of Social Gatherings with Friends and Family: More than three times a week    Attends Religious Services: More than 4 times per year    Active Member of [employer] or Organizations: No    Attends Banker Meetings: Not on file    Marital Status: Divorced  Intimate Partner Violence: Not At Risk (07/19/2023)   Humiliation, Afraid, Rape, and Kick questionnaire    Fear of Current or Ex-Partner: No    Emotionally Abused: No    Physically Abused: No    Sexually Abused: No   PHYSICAL EXAM  There were no vitals filed for this visit.  There is no height or weight on file to calculate BMI.  Generalized: Well developed, in no acute distress   Neurological examination  Mentation: Alert oriented to time, place, history taking. Follows all commands speech and language fluent.  Cranial nerve II-XII: Pupils were equal round reactive to light. Extraocular movements were full, visual field were full on confrontational test. Facial sensation and strength were normal. Head turning and shoulder shrug  were normal and symmetric. Motor: Good strength all extremities Sensory: Sensory testing is intact to soft touch on all 4 extremities. No evidence of extinction is noted.  Coordination: Cerebellar testing reveals good  finger-nose-finger and heel-to-shin bilaterally.  Gait and station: Gait is normal, independent  DIAGNOSTIC DATA (LABS, IMAGING, TESTING) - I reviewed patient records, labs, notes, testing and imaging myself where available.  Lab Results  Component Value Date   WBC 3.0 (L) 09/18/2022   HGB 11.8 09/18/2022   HCT 35.8 09/18/2022   MCV 98 (H) 09/18/2022   PLT 204 09/18/2022      Component Value Date/Time   NA  134 09/18/2022 0841   K 4.2 09/18/2022 0841   CL 97 09/18/2022 0841   CO2 26 09/18/2022 0841   GLUCOSE 94 09/18/2022 0841   GLUCOSE 104 (H) 06/12/2022 1025   BUN 6 (L) 09/18/2022 0841   CREATININE 0.62 09/18/2022 0841   CALCIUM  9.4 09/18/2022 0841   PROT 6.8 09/18/2022 0841   ALBUMIN 3.7 (L) 09/18/2022 0841   AST 18 09/18/2022 0841   ALT 11 09/18/2022 0841   ALKPHOS 100 09/18/2022 0841   BILITOT 0.3 09/18/2022 0841   GFRNONAA >60 09/24/2021 1907   GFRAA 81 04/01/2019 1407   Lab Results  Component Value Date   CHOL 213 (H) 04/20/2021   HDL 45.80 04/20/2021   LDLCALC 148 (H) 04/20/2021   LDLDIRECT 141.0 06/12/2022   TRIG 98.0 04/20/2021   CHOLHDL 5 04/20/2021   Lab Results  Component Value Date   HGBA1C 6.3 (A) 08/01/2023   Lab Results  Component Value Date   VITAMINB12 510 10/06/2020   Lab Results  Component Value Date   TSH 0.51 06/12/2022   ASSESSMENT AND PLAN 78 y.o. year old female  has a past medical history of Abdominal pain (11/11/2020), Altered mental status (03/11/2013), Diabetes mellitus without complication (HCC), Gastroesophageal reflux disease, History of subdural hematoma (post traumatic) (03/23/2020), Memory deficit (05/20/2014), Murmur, cardiac (10/16/2022), Partial symptomatic epilepsy with complex partial seizures, not intractable, without status epilepticus (HCC) (05/20/2014), and Ventral hernia. here with:  1.  History of seizures, last was in November 2022 2.  History of subdural hematoma  -No recent seizures, check antiseizure  medication levels -For now, continue Trileptal  150 mg twice daily, Vimpat  150 mg twice daily, refill given -Daughter is now assisting with medication management, she is no longer driving -Follow-up in 1 year or sooner if worse. Can follow up with Lauraine  I personally spent a total of 30 minutes in the care of the patient today including preparing to see the patient, getting/reviewing separately obtained history, performing a medically appropriate exam/evaluation, counseling and educating, and placing orders.   Pastor Falling, MD [facility] [address] [place], [place] [address] [phone]

## 2023-08-17 ENCOUNTER — Ambulatory Visit: Payer: Self-pay | Admitting: Neurology

## 2023-08-17 LAB — LACOSAMIDE: Lacosamide: 14.4 ug/mL — ABNORMAL HIGH (ref 5.0–10.0)

## 2023-08-17 LAB — OXCARBAZEPINE (TRILEPTAL), SERUM: Oxcarbazepine SerPl-Mcnc: 8 ug/mL — ABNORMAL LOW (ref 10–35)

## 2023-08-20 ENCOUNTER — Ambulatory Visit: Payer: Self-pay | Admitting: Nurse Practitioner

## 2023-08-20 ENCOUNTER — Other Ambulatory Visit: Payer: Self-pay | Admitting: Nurse Practitioner

## 2023-08-20 DIAGNOSIS — N6320 Unspecified lump in the left breast, unspecified quadrant: Secondary | ICD-10-CM

## 2023-08-20 DIAGNOSIS — N390 Urinary tract infection, site not specified: Secondary | ICD-10-CM

## 2023-08-28 ENCOUNTER — Other Ambulatory Visit: Payer: Self-pay | Admitting: Nurse Practitioner

## 2023-08-28 ENCOUNTER — Ambulatory Visit
Admission: RE | Admit: 2023-08-28 | Discharge: 2023-08-28 | Disposition: A | Discharge status: Home / Self Care | Source: Ambulatory Visit | Attending: Nurse Practitioner

## 2023-08-28 ENCOUNTER — Ambulatory Visit
Admission: RE | Admit: 2023-08-28 | Discharge: 2023-08-28 | Disposition: A | Discharge status: Home / Self Care | Source: Ambulatory Visit | Attending: Nurse Practitioner | Admitting: Nurse Practitioner

## 2023-08-28 DIAGNOSIS — N6320 Unspecified lump in the left breast, unspecified quadrant: Secondary | ICD-10-CM

## 2023-08-28 DIAGNOSIS — N6321 Unspecified lump in the left breast, upper outer quadrant: Secondary | ICD-10-CM | POA: Diagnosis not present

## 2023-09-16 ENCOUNTER — Other Ambulatory Visit: Payer: Self-pay | Admitting: Nurse Practitioner

## 2023-09-16 DIAGNOSIS — E1169 Type 2 diabetes mellitus with other specified complication: Secondary | ICD-10-CM

## 2023-11-01 ENCOUNTER — Encounter: Payer: Self-pay | Admitting: Nurse Practitioner

## 2023-11-01 ENCOUNTER — Ambulatory Visit: Admitting: Nurse Practitioner

## 2023-11-01 VITALS — BP 118/64 | HR 68 | Temp 98.3°F | Ht 62.0 in | Wt 137.4 lb

## 2023-11-01 DIAGNOSIS — R7989 Other specified abnormal findings of blood chemistry: Secondary | ICD-10-CM | POA: Diagnosis not present

## 2023-11-01 DIAGNOSIS — E785 Hyperlipidemia, unspecified: Secondary | ICD-10-CM | POA: Diagnosis not present

## 2023-11-01 DIAGNOSIS — Z23 Encounter for immunization: Secondary | ICD-10-CM

## 2023-11-01 DIAGNOSIS — E1165 Type 2 diabetes mellitus with hyperglycemia: Secondary | ICD-10-CM

## 2023-11-01 DIAGNOSIS — E1169 Type 2 diabetes mellitus with other specified complication: Secondary | ICD-10-CM

## 2023-11-01 LAB — RENAL FUNCTION PANEL
Albumin: 4.1 g/dL (ref 3.5–5.2)
BUN: 6 mg/dL (ref 6–23)
CO2: 32 meq/L (ref 19–32)
Calcium: 9.5 mg/dL (ref 8.4–10.5)
Chloride: 97 meq/L (ref 96–112)
Creatinine, Ser: 0.63 mg/dL (ref 0.40–1.20)
GFR: 85.44 mL/min (ref >60.00)
Glucose, Bld: 76 mg/dL (ref 70–99)
Phosphorus: 3 mg/dL (ref 2.3–4.6)
Potassium: 3.5 meq/L (ref 3.5–5.1)
Sodium: 133 meq/L — ABNORMAL LOW (ref 135–145)

## 2023-11-01 LAB — LIPID PANEL
Cholesterol: 127 mg/dL (ref 0–200)
HDL: 48.6 mg/dL (ref >39.00)
LDL Cholesterol: 63 mg/dL (ref 0–99)
NonHDL: 77.96
Total CHOL/HDL Ratio: 3
Triglycerides: 75 mg/dL (ref 0.0–149.0)
VLDL: 15 mg/dL (ref 0.0–40.0)

## 2023-11-01 LAB — TSH: TSH: 0.02 u[IU]/mL — ABNORMAL LOW (ref 0.35–5.50)

## 2023-11-01 LAB — MICROALBUMIN / CREATININE URINE RATIO
Creatinine,U: 45.4 mg/dL
Microalb Creat Ratio: 15.4 mg/g (ref 0.0–30.0)
Microalb, Ur: 0.7 mg/dL (ref 0.0–1.9)

## 2023-11-01 LAB — T4, FREE: Free T4: 0.73 ng/dL (ref 0.60–1.60)

## 2023-11-01 LAB — HEMOGLOBIN A1C: Hgb A1c MFr Bld: 6.6 % — ABNORMAL HIGH (ref 4.6–6.5)

## 2023-11-01 NOTE — Assessment & Plan Note (Signed)
 Repeat Tsh, T4,  Check TPO and TSI  Stable weight, BP, mood, and no edema

## 2023-11-01 NOTE — Progress Notes (Signed)
 Established Patient Visit  Patient: Sabrina Guerrero   DOB: 14-Jan-1946   78 y.o. Female  MRN: [id number] Visit Date: 11/01/2023  Subjective:    Chief Complaint  Patient presents with   Follow-up    NOT FASTING 3 month follow up for DM, HTN, Hyperlipidemia  See MyChart Message from today   HPI Accompanied by her son  Low TSH level Repeat Tsh, T4,  Check TPO and TSI  Stable weight, BP, mood, and no edema  Hyperlipidemia associated with type 2 diabetes mellitus (HCC) Repeat Lipid panel Maintain crestor  dose  Type 2 diabetes mellitus with hyperglycemia, without long-term current use of insulin (HCC) Repeat hgbA1c  Normal foot exam No need forr med at this time Advised son to schedule aappt with ophthalmology, number provided F/up in 3months  BP Readings from Last 3 Encounters:  11/01/23 118/64  08/14/23 134/86  08/01/23 112/60    Wt Readings from Last 3 Encounters:  11/01/23 137 lb 6.4 oz (62.3 kg)  08/14/23 130 lb 8 oz (59.2 kg)  08/01/23 127 lb 6.4 oz (57.8 kg)    Reviewed medical, surgical, and social history today  Medications: Outpatient Medications Prior to Visit  Medication Sig   ACCU-CHEK GUIDE test strip USE TO TEST DAILY   blood glucose meter kit and supplies KIT Dispense based on patient and insurance preference. Check glucose daily, E11.65   Lacosamide  150 MG TABS Take 1 tablet (150 mg total) by mouth 2 (two) times daily.   omeprazole  (PRILOSEC) 40 MG capsule Take 1 capsule (40 mg total) by mouth daily.   OXcarbazepine  (TRILEPTAL ) 150 MG tablet Take 1 tablet (150 mg total) by mouth 2 (two) times daily.   rosuvastatin  (CRESTOR ) 20 MG tablet TAKE 1 TABLET BY MOUTH EVERY DAY   No facility-administered medications prior to visit.   Reviewed past medical and social history.   ROS per HPI above      Objective:  BP 118/64 (BP Location: Right Arm, Patient Position: Sitting, Cuff Size: Large)   Pulse 68   Temp 98.3 F (36.8 C) (Oral)   Ht  5' 2 (1.575 m)   Wt 137 lb 6.4 oz (62.3 kg)   SpO2 98%   BMI 25.13 kg/m      Physical Exam Vitals reviewed.  Cardiovascular:     Rate and Rhythm: Normal rate and regular rhythm.     Pulses: Normal pulses.     Heart sounds:     Gallop present.  Pulmonary:     Effort: Pulmonary effort is normal.     Breath sounds: Normal breath sounds.  Musculoskeletal:     Right lower leg: No edema.     Left lower leg: No edema.  Neurological:     Mental Status: She is alert.     Comments: Oriented to person and place     No results found for any visits on 11/01/23.    Assessment & Plan:    Problem List Items Addressed This Visit     Hyperlipidemia associated with type 2 diabetes mellitus (HCC)   Repeat Lipid panel Maintain crestor  dose      Relevant Orders   Lipid panel   Low TSH level   Repeat Tsh, T4,  Check TPO and TSI  Stable weight, BP, mood, and no edema      Relevant Orders   TSH   T4, free   Thyroid  peroxidase antibody  Thyroid  stimulating immunoglobulin   Type 2 diabetes mellitus with hyperglycemia, without long-term current use of insulin (HCC)   Repeat hgbA1c  Normal foot exam No need forr med at this time Advised son to schedule aappt with ophthalmology, number provided F/up in 3months      Relevant Orders   Hemoglobin A1c   Renal Function Panel   Urine microalbumin-creatinine with uACR   Other Visit Diagnoses       Immunization due    -  Primary   Relevant Orders   Flu vaccine HIGH DOSE PF(Fluzone Trivalent) (Completed)      Return in about 6 months (around 04/30/2024) for HTN, DM, hyperlipidemia (fasting).     Roselie Mood, NP

## 2023-11-01 NOTE — Patient Instructions (Addendum)
 Call [facility] to schedule DIABETES eye exam: [phone].  Go to lab Maintain Heart healthy diet and daily exercise. Maintain current medications.

## 2023-11-01 NOTE — Assessment & Plan Note (Signed)
 Repeat Lipid panel Maintain crestor  dose

## 2023-11-01 NOTE — Assessment & Plan Note (Addendum)
 Repeat hgbA1c  Normal foot exam No need forr med at this time Advised son to schedule aappt with ophthalmology, number provided F/up in 3months

## 2023-11-04 ENCOUNTER — Ambulatory Visit: Payer: Self-pay | Admitting: Nurse Practitioner

## 2023-11-04 DIAGNOSIS — R7989 Other specified abnormal findings of blood chemistry: Secondary | ICD-10-CM

## 2023-11-04 DIAGNOSIS — E0590 Thyrotoxicosis, unspecified without thyrotoxic crisis or storm: Secondary | ICD-10-CM

## 2023-11-08 LAB — THYROID STIMULATING IMMUNOGLOBULIN: TSI: 151 %{baseline} — ABNORMAL HIGH (ref <140)

## 2023-11-08 LAB — THYROID PEROXIDASE ANTIBODY: Thyroperoxidase Ab SerPl-aCnc: >900 [IU]/mL — ABNORMAL HIGH (ref <9)

## 2023-11-21 ENCOUNTER — Encounter: Payer: Self-pay | Admitting: Nurse Practitioner

## 2023-11-21 DIAGNOSIS — R413 Other amnesia: Secondary | ICD-10-CM

## 2023-11-21 DIAGNOSIS — N3946 Mixed incontinence: Secondary | ICD-10-CM

## 2023-11-24 ENCOUNTER — Encounter: Payer: Self-pay | Admitting: Neurology

## 2023-11-25 NOTE — Addendum Note (Signed)
 Addended by: KATHEEN ROSELIE CROME on: 11/25/2023 02:53 PM   Modules accepted: Orders

## 2023-11-26 ENCOUNTER — Telehealth: Payer: Self-pay | Admitting: Nurse Practitioner

## 2023-11-26 DIAGNOSIS — M85831 Other specified disorders of bone density and structure, right forearm: Secondary | ICD-10-CM

## 2023-11-26 NOTE — Telephone Encounter (Signed)
 Please reorder Dexa scan at [facility]. [facility] is no longer scheduling Dexa/Bone Density scans. Please route message back to Admin team to contact patient for scheduling.

## 2023-11-26 NOTE — Telephone Encounter (Signed)
 Updated order for Dexa scan

## 2023-11-26 NOTE — Addendum Note (Signed)
 Addended by: KATHEEN GARDEN L on: 11/26/2023 12:27 PM   Modules accepted: Orders

## 2023-12-03 ENCOUNTER — Other Ambulatory Visit: Payer: Self-pay | Admitting: Nurse Practitioner

## 2023-12-03 DIAGNOSIS — K219 Gastro-esophageal reflux disease without esophagitis: Secondary | ICD-10-CM

## 2023-12-18 NOTE — Progress Notes (Signed)
 Sabrina Guerrero                                          MRN: [id number]   12/18/2023   The [facility] Quality Team Specialist reviewed this patient medical record for the purposes of chart review for care gap closure. The following were reviewed: chart review for care gap closure-kidney health evaluation for diabetes:eGFR  and uACR.    [facility] Quality Team

## 2024-01-20 ENCOUNTER — Other Ambulatory Visit: Payer: Self-pay | Admitting: Neurology

## 2024-01-22 NOTE — Telephone Encounter (Signed)
 Last seen 08/14/23 Follow up scheduled on 02/05/24   Dispensed Days Supply Quantity Provider Pharmacy  LACOSAMIDE  150 MG TABLET 12/14/2023 30 60 each Gayland Lauraine PARAS, NP [facility] [phone] - G...     Rx pending to be signed

## 2024-02-05 ENCOUNTER — Encounter: Payer: Self-pay | Admitting: Neurology

## 2024-02-05 ENCOUNTER — Ambulatory Visit (INDEPENDENT_AMBULATORY_CARE_PROVIDER_SITE_OTHER): Admitting: Neurology

## 2024-02-05 VITALS — BP 115/77 | HR 68 | Ht 62.0 in | Wt 141.0 lb

## 2024-02-05 DIAGNOSIS — R4189 Other symptoms and signs involving cognitive functions and awareness: Secondary | ICD-10-CM

## 2024-02-05 DIAGNOSIS — G40209 Localization-related (focal) (partial) symptomatic epilepsy and epileptic syndromes with complex partial seizures, not intractable, without status epilepticus: Secondary | ICD-10-CM | POA: Diagnosis not present

## 2024-02-05 DIAGNOSIS — Z87828 Personal history of other (healed) physical injury and trauma: Secondary | ICD-10-CM | POA: Diagnosis not present

## 2024-02-05 NOTE — Patient Instructions (Addendum)
 Continue current medications including oxcarbazepine  150 mg twice daily and lacosamide  150 mg twice daily Will check dementia labs including ATN and APO E4 Please call if you do have another seizure Return as scheduled in July

## 2024-02-05 NOTE — Progress Notes (Signed)
 "  PATIENT: Sabrina Guerrero DOB: [birth year]  REASON FOR VISIT: Follow up for seizures HISTORY FROM: Patient, daughter Edsel PRIMARY NEUROLOGIST: Dr. Gregg  HISTORY OF PRESENT ILLNESS: Today 02/05/2024 Sabrina Guerrero is a 78 year old female with a history of seizures who presents for a follow-up on seizure management and memory concerns. Her daughter is concerned about her medication adherence and memory issues.  She has not experienced any seizures since her last visit in July. She is often alone at home during the day, raising concerns about potential unobserved seizures. Her niece has not noticed any signs of seizures, such as falls or unusual situations, and family friends have not observed any episodes during outings.  Her memory is variable, with good days and days where it is significantly impaired. She has difficulty remembering conversations and sometimes misplaces items. No issues with self-care, such as showering or dressing, and she has not left the stove on or misplaced food. Her daughter notes long-term memory has been affected since her surgery, and she sometimes needs reminders about appointments.  She is currently on medication for seizures but occasionally forgets to take her doses. She sometimes takes her medication later in the day if she remembers, but there are instances where she misses doses entirely. Her daughter has set up a system to help her remember, but she does not use her phone for reminders. She takes Prilosec regularly for stomach issues, which she finds helpful, but often forgets her seizure medication.  Her daughter confirms that while she sometimes forgets to take her medication, she does not exhibit repetitive behavior or require constant reminders for daily tasks.    INTERVAL HISTORY 08/14/2023 Patient presents today for follow-up, last visit was in November.  Today she is accompanied by her daughter.  They do not report any seizure or seizure-like activity.   Daughter tells me for period of 2 weeks patient was not taking her anti seizure medications because she felt the medication was causing muscular pain and cramping.  She did present to urgent care, was told she has a low electrolytes, advised to start magnesium supplementation.  She was also told that she had a UTI, recently finish her treatment.  For the past 2 to 3 weeks she has been compliant with her lacosamide  and oxcarbazepine . There is also concern about worsening memory loss.  Daughter tells me the patient is very forgetful and sometimes she perseverate on ideas.  They saw Dr. 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   INTERVAL HISTORY 12/25/2022: Labs January 2024 showed sodium level 132, Trileptal  level 7, Vimpat  level 13.6. Here with daughter. If overusing right arm may have sharp pain in right arm, rest goes away, not weekly. Taking Vimpat  150 mg BID, Trileptal  150 mg BID. In the past her seizures have been staring events. In March she had gone back to Trileptal  300 mg BID, her daughter helps now with medications, she stopped driving. Does have some memory issues, trouble with short term.  Had neuropsychological evaluation with Dr. Corina in 2022, did not feel there was a neurodegenerative disorder.  Felt memory issues were related to seizure disorder.  03/01/22 SS: Here alone, she drove here. Doing well. No seizures. Medications listed are: Trileptal  150 mg BID, Vimpat  150 mg BID. Back in Sept 2022 Dr. Jenel has increased her Trileptal  to 300 mg BID due to seizures.  I called the pharmacy, the last time  she refilled Trileptal  at the 300 mg strength was in March 2023.  Since then she has been on 150 mg twice a day.   08/29/21 Dr. Gregg: Patient presents today for follow-up, she is accompanied by her daughter.  Last visit was in January and since then she denies any additional seizures or seizure-like activity.   She is currently on Vimpat  100 mg twice daily and Trileptal  300 mg twice daily, denies any side effect of the medication and reported tolerance.  INTERVAL HISTORY 02/28/21:  Sabrina Guerrero here today for follow-up with history of seizures.  Had about 3 seizures in 2022, at last visit with Dr. Jenel, Trileptal  was increased to 300 mg twice daily. EEG was normal in November 2022.  Vimpat  was added in November 2022 due to breakthrough seizures. 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 December 2021 had brain surgery for SDH. She lives with her daughter. Walks 20 minutes almost daily. No falls.   HISTORY  10/06/20 Dr. Jenel: Sabrina Guerrero is a 78 year old right-handed black female with a history of diabetes and a history of seizures.  The patient has been well controlled on a very low-dose of Trileptal  taking 150 mg twice daily for a number of years.  Within the last year, she did have a fall and developed bilateral subdural hematoma requiring surgery.  She had fallen in October 2021, presented to the hospital on 01 February 2020 with gait instability and confusion.  She lives with her daughter who has noted that over the last several months she has sustained 3 witnessed seizure events.  The patient would not remember whether or not she had a seizure if she were alone.  The patient will have a 10 to 15-minute episode of staring off and associated confusion with gradual return to normal baseline.  The patient has developed a memory disorder, she has been set up for formal neuropsychological testing in November 2022.  She is no longer operating a motor vehicle.  She does report occasional episodes of soreness in the head that may come  and go, she has had 4-5 such episodes since her surgery for the subdural hematoma.  She returns to this office for an evaluation.  REVIEW OF SYSTEMS: Out of a complete 14 system review of symptoms, the patient complains only of the following symptoms, and all other reviewed systems are negative.  See HPI  ALLERGIES: No Known Allergies  HOME MEDICATIONS: Outpatient Medications Prior to Visit  Medication Sig Dispense Refill   ACCU-CHEK GUIDE test strip USE TO TEST DAILY 100 strip 2   blood glucose meter kit and supplies KIT Dispense based on patient and insurance preference. Check glucose daily, E11.65 1 each 0   Lacosamide  150 MG TABS TAKE 1 TABLET BY MOUTH TWICE A DAY 60 tablet 5   omeprazole  (PRILOSEC) 40 MG capsule TAKE 1 CAPSULE (40 MG TOTAL) BY MOUTH DAILY. 90 capsule 3   OXcarbazepine  (TRILEPTAL ) 150 MG tablet Take 1 tablet (150 mg total) by mouth 2 (two) times daily. 180 tablet 3   rosuvastatin  (CRESTOR ) 20 MG tablet TAKE 1 TABLET BY MOUTH EVERY DAY 90 tablet 1   No facility-administered medications prior to visit.    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, requiring surgery   Memory deficit 05/20/2014   Murmur, cardiac 10/16/2022   Partial symptomatic epilepsy with complex partial seizures, not intractable, without status epilepticus (HCC) 05/20/2014   most recent 09/10/20   Ventral hernia     PAST SURGICAL HISTORY: Past Surgical History:  Procedure Laterality Date   BRAIN SURGERY  02/03/2020   COLONOSCOPY WITH PROPOFOL  N/A 12/08/2020   Procedure: COLONOSCOPY WITH PROPOFOL ;  Surgeon: Therisa Bi, MD;  Location: [facility]COPY;  Service: Gastroenterology;  Laterality: N/A;   ESOPHAGOGASTRODUODENOSCOPY N/A 12/08/2020   Procedure:  ESOPHAGOGASTRODUODENOSCOPY (EGD);  Surgeon: Therisa Bi, MD;  Location: [facility] ENDOSCOPY;  Service: Gastroenterology;  Laterality: N/A;   HERNIA REPAIR     SUBDURAL HEMATOMA EVACUATION VIA CRANIOTOMY  01/2020   TUBAL LIGATION     VENTRAL HERNIA REPAIR      FAMILY HISTORY: Family History  Problem Relation Age of Onset   Arthritis Mother    Cancer Brother        lung   Colon cancer Neg Hx    Esophageal cancer Neg Hx    Stomach cancer Neg Hx    Liver disease Neg Hx     SOCIAL HISTORY: Social History   Socioeconomic History   Marital status: Divorced    Spouse name: Not on file   Number of children: 3   Years of education: 18 years   Highest education level: Bachelor's degree (e.g., BA, AB, BS)  Occupational History   Occupation: unemployed  Tobacco Use   Smoking status: Never   Smokeless tobacco: Never  Vaping Use   Vaping status: Never Used  Substance and Sexual Activity   Alcohol use: Never   Drug use: Never   Sexual activity: Never  Other Topics Concern   Not on file  Social History Narrative   10/06/20 lives with dgtr   Patient is right handed.   Patient does not drink caffeine.   Social Drivers of Health   Tobacco Use: Low Risk (02/05/2024)   Patient History    Smoking Tobacco Use: Never    Smokeless Tobacco Use: Never    Passive Exposure: Not on file  Financial Resource Strain: Low Risk (07/18/2023)   Overall Financial Resource Strain (CARDIA)    Difficulty of Paying Living Expenses: Not hard at all  Food Insecurity: No Food Insecurity (07/18/2023)   Epic    Worried About Programme Researcher, Broadcasting/film/video in the Last Year: Never true    Ran Out of Food in the Last Year: Never true  Transportation Needs: No Transportation Needs (07/18/2023)   Epic    Lack of Transportation (Medical): No    Lack of Transportation (Non-Medical): No  Physical Activity: Sufficiently Active (07/18/2023)   Exercise Vital Sign    Days of Exercise per Week: 6 days    Minutes of Exercise per  Session: 30 min  Stress: Stress Concern Present (07/18/2023)   [employer] of Occupational Health - Occupational Stress Questionnaire    Feeling of Stress: To some extent  Social Connections: Moderately Isolated (07/18/2023)   Social Connection and Isolation Panel    Frequency of Communication with Friends and Family: More than three times a week    Frequency of Social Gatherings with Friends and Family: More than three times a week    Attends Religious Services: More than  4 times per year    Active Member of Clubs or Organizations: No    Attends Banker Meetings: Not on file    Marital Status: Divorced  Intimate Partner Violence: Not At Risk (07/19/2023)   Epic    Fear of Current or Ex-Partner: No    Emotionally Abused: No    Physically Abused: No    Sexually Abused: No  Depression (PHQ2-9): Low Risk (07/19/2023)   Depression (PHQ2-9)    PHQ-2 Score: 0  Alcohol Screen: Low Risk (07/18/2023)   Alcohol Screen    Last Alcohol Screening Score (AUDIT): 0  Housing: Low Risk (07/18/2023)   Epic    Unable to Pay for Housing in the Last Year: No    Number of Times Moved in the Last Year: 0    Homeless in the Last Year: No  Utilities: Not At Risk (07/19/2023)   Epic    Threatened with loss of utilities: No  Health Literacy: Adequate Health Literacy (07/19/2023)   B1300 Health Literacy    Frequency of need for help with medical instructions: Never   PHYSICAL EXAM  Vitals:   02/05/24 1140  BP: 115/77  Pulse: 68  SpO2: 95%  Weight: 141 lb (64 kg)  Height: 5' 2 (1.575 m)    Body mass index is 25.79 kg/m.  Generalized: Well developed, in no acute distress     02/05/2024   11:42 AM  Montreal Cognitive Assessment   Visuospatial/ Executive (0/5) 5  Naming (0/3) 3  Attention: Read list of digits (0/2) 2  Attention: Read list of letters (0/1) 1  Attention: Serial 7 subtraction starting at 100 (0/3) 0  Language: Repeat phrase (0/2) 2  Language : Fluency (0/1) 1   Abstraction (0/2) 2  Delayed Recall (0/5) 0  Orientation (0/6) 6  Total 22  Adjusted Score (based on education) 22    Neurological examination  Mentation: Alert oriented to time, place, history taking. Follows all commands speech and language fluent.  Cranial nerve II-XII: Pupils were equal round reactive to light. Extraocular movements were full, visual field were full on confrontational test. Facial sensation and strength were normal. Head turning and shoulder shrug  were normal and symmetric. Motor: Good strength all extremities Sensory: Sensory testing is intact to soft touch on all 4 extremities. No evidence of extinction is noted.  Coordination: Cerebellar testing reveals good finger-nose-finger and heel-to-shin bilaterally.  Gait and station: Gait is normal, independent  DIAGNOSTIC DATA (LABS, IMAGING, TESTING) - I reviewed patient records, labs, notes, testing and imaging myself where available.  Lab Results  Component Value Date   WBC 3.0 (L) 09/18/2022   HGB 11.8 09/18/2022   HCT 35.8 09/18/2022   MCV 98 (H) 09/18/2022   PLT 204 09/18/2022      Component Value Date/Time   NA 133 (L) 11/01/2023 1037   NA 134 09/18/2022 0841   K 3.5 11/01/2023 1037   CL 97 11/01/2023 1037   CO2 32 11/01/2023 1037   GLUCOSE 76 11/01/2023 1037   BUN 6 11/01/2023 1037   BUN 6 (L) 09/18/2022 0841   CREATININE 0.63 11/01/2023 1037   CALCIUM  9.5 11/01/2023 1037   PROT 6.8 09/18/2022 0841   ALBUMIN 4.1 11/01/2023 1037   ALBUMIN 3.7 (L) 09/18/2022 0841   AST 18 09/18/2022 0841   ALT 11 09/18/2022 0841   ALKPHOS 100 09/18/2022 0841   BILITOT 0.3 09/18/2022 0841   GFRNONAA >60 09/24/2021 1907   GFRAA 81 04/01/2019 1407  Lab Results  Component Value Date   CHOL 127 11/01/2023   HDL 48.60 11/01/2023   LDLCALC 63 11/01/2023   LDLDIRECT 141.0 06/12/2022   TRIG 75.0 11/01/2023   CHOLHDL 3 11/01/2023   Lab Results  Component Value Date   HGBA1C 6.6 (H) 11/01/2023   Lab Results   Component Value Date   VITAMINB12 510 10/06/2020   Lab Results  Component Value Date   TSH 0.02 (L) 11/01/2023   ASSESSMENT AND PLAN 78 y.o. year old female  has a past medical history of Abdominal pain (11/11/2020), Altered mental status (03/11/2013), Diabetes mellitus without complication (HCC), Gastroesophageal reflux disease, History of subdural hematoma (post traumatic) (03/23/2020), Memory deficit (05/20/2014), Murmur, cardiac (10/16/2022), Partial symptomatic epilepsy with complex partial seizures, not intractable, without status epilepticus (HCC) (05/20/2014), and Ventral hernia. here with:  1.  History of seizures, last was in November 2022 2.  History of subdural hematoma   Partial symptomatic epilepsy with complex partial seizures No seizures reported since last visit in July. Concerns about potential undetected seizures due to being alone during the day. Emphasized the importance of medication adherence to prevent seizures, which could lead to unnoticed episodes and potential harm. Discussed the risk of seizures occurring without awareness, especially when alone, and the importance of taking medication consistently to mitigate this risk. - Encouraged consistent medication adherence. - Suggested using a visual reminder system, such as a blackboard with checkmarks, to track medication intake. - Will order EEG to assess brain activity.  Mild cognitive impairment Memory issues, including difficulty recalling conversations and misplacing items. No significant issues with self-care or daily functioning. Memory test score of 22, slightly below normal range. Discussed potential progression to dementia and the role of seizures in affecting memory. Explained that mild cognitive impairment can be due to dementia, and tests are used to risk stratify. Discussed the four main types of dementia, with Alzheimer's being the most common. Explained that having Alzheimer's markers does not confirm  current dementia but indicates a high risk for future development. - Ordered blood test for Alzheimer's dementia biomarkers. - Encouraged daily exercise to support cognitive health.    Patient Instructions  Continue current medications including oxcarbazepine  150 mg twice daily and lacosamide  150 mg twice daily Will check dementia labs including ATN and APO E4 Please call if you do have another seizure Return as scheduled in July   I personally spent a total of 50 minutes in the care of the patient today including preparing to see the patient, getting/reviewing separately obtained history, performing a medically appropriate exam/evaluation, counseling and educating, placing orders, and documenting clinical information in the EHR.   Pastor Falling, MD [facility] [address] [place], [place] [address] [phone]  "

## 2024-02-12 ENCOUNTER — Ambulatory Visit: Admitting: *Deleted

## 2024-02-12 DIAGNOSIS — G40209 Localization-related (focal) (partial) symptomatic epilepsy and epileptic syndromes with complex partial seizures, not intractable, without status epilepticus: Secondary | ICD-10-CM

## 2024-02-12 NOTE — Procedures (Signed)
" ° ° °  History:  79 year old woman with seizure   EEG classification: Awake and drowsy  Duration: 27 minutes   Technical aspects: This EEG study was done with scalp electrodes positioned according to the 10-20 International system of electrode placement. Electrical activity was reviewed with band pass filter of 1-70Hz , sensitivity of 7 uV/mm, display speed of 54mm/sec with a 60Hz  notched filter applied as appropriate. EEG data were recorded continuously and digitally stored.   Description of the recording: The background rhythms of this recording consists of a fairly well modulated medium amplitude alpha rhythm of 9-10 Hz that is reactive to eye opening and closure. Present in the anterior head region is a 15-20 Hz beta activity. Photic stimulation was performed, did not show any abnormalities. Hyperventilation was also performed, did not show any abnormalities. Drowsiness was manifested by background fragmentation. No abnormal epileptiform discharges seen during this recording. There was no focal slowing. There were no electrographic seizure identified.   Abnormality: None   Impression: This is a normal awake and drowsy EEG. No evidence of interictal epileptiform discharges. Normal EEGs, however, do not rule out epilepsy.    Pastor Falling, MD [facility]  "

## 2024-02-13 ENCOUNTER — Ambulatory Visit: Payer: Self-pay | Admitting: Neurology

## 2024-02-21 LAB — ATN PROFILE
A -- Beta-amyloid 42/40 Ratio: 0.117
Beta-amyloid 40: 181.74 pg/mL
Beta-amyloid 42: 21.28 pg/mL
N -- NfL, Plasma: 1.74 pg/mL (ref 0.00–6.04)
T -- p-tau181: 0.53 pg/mL (ref 0.00–0.97)

## 2024-02-21 LAB — APOE ALZHEIMER'S DISEASE RISK

## 2024-02-27 ENCOUNTER — Other Ambulatory Visit: Payer: Self-pay | Admitting: Neurology

## 2024-03-02 ENCOUNTER — Encounter

## 2024-03-02 ENCOUNTER — Other Ambulatory Visit

## 2024-03-03 ENCOUNTER — Ambulatory Visit
Admission: RE | Admit: 2024-03-03 | Discharge: 2024-03-03 | Disposition: A | Source: Ambulatory Visit | Attending: Nurse Practitioner

## 2024-03-03 DIAGNOSIS — N6320 Unspecified lump in the left breast, unspecified quadrant: Secondary | ICD-10-CM

## 2024-03-17 ENCOUNTER — Ambulatory Visit: Admitting: "Endocrinology

## 2024-07-24 ENCOUNTER — Ambulatory Visit

## 2024-08-18 ENCOUNTER — Ambulatory Visit: Admitting: Neurology
